# Patient Record
Sex: Male | Born: 1955 | Race: White | Hispanic: No | Smoking: Current every day smoker
Health system: Southern US, Community
[De-identification: ages and names within clinical notes are randomized; demographics above are authoritative.]

## PROBLEM LIST (undated history)

## (undated) DIAGNOSIS — I10 Essential (primary) hypertension: Secondary | ICD-10-CM

## (undated) DIAGNOSIS — I639 Cerebral infarction, unspecified: Secondary | ICD-10-CM

## (undated) DIAGNOSIS — E785 Hyperlipidemia, unspecified: Secondary | ICD-10-CM

## (undated) DIAGNOSIS — Z8709 Personal history of other diseases of the respiratory system: Secondary | ICD-10-CM

## (undated) DIAGNOSIS — F32A Depression, unspecified: Secondary | ICD-10-CM

## (undated) DIAGNOSIS — N183 Chronic kidney disease, stage 3 (moderate): Secondary | ICD-10-CM

## (undated) DIAGNOSIS — F329 Major depressive disorder, single episode, unspecified: Secondary | ICD-10-CM

## (undated) HISTORY — DX: Cerebral infarction, unspecified: I63.9

## (undated) HISTORY — PX: THORACENTESIS: SHX235

## (undated) HISTORY — DX: Chronic kidney disease, stage 3 (moderate): N18.3

## (undated) HISTORY — DX: Hyperlipidemia, unspecified: E78.5

---

## 2003-09-20 ENCOUNTER — Inpatient Hospital Stay (HOSPITAL_COMMUNITY): Admission: EM | Admit: 2003-09-20 | Discharge: 2003-09-23 | Payer: Self-pay | Admitting: Emergency Medicine

## 2003-09-21 ENCOUNTER — Encounter (INDEPENDENT_AMBULATORY_CARE_PROVIDER_SITE_OTHER): Payer: Self-pay | Admitting: Cardiology

## 2008-04-08 ENCOUNTER — Ambulatory Visit: Payer: Self-pay | Admitting: Emergency Medicine

## 2008-04-08 ENCOUNTER — Encounter: Payer: Self-pay | Admitting: Emergency Medicine

## 2008-04-08 ENCOUNTER — Inpatient Hospital Stay (HOSPITAL_COMMUNITY): Admission: AD | Admit: 2008-04-08 | Discharge: 2008-04-15 | Payer: Self-pay | Admitting: Pulmonary Disease

## 2008-04-08 ENCOUNTER — Ambulatory Visit: Payer: Self-pay | Admitting: Diagnostic Radiology

## 2008-04-08 ENCOUNTER — Ambulatory Visit: Payer: Self-pay | Admitting: Cardiovascular Disease

## 2008-04-09 ENCOUNTER — Encounter: Payer: Self-pay | Admitting: Emergency Medicine

## 2008-04-22 ENCOUNTER — Ambulatory Visit: Payer: Self-pay | Admitting: Pulmonary Disease

## 2008-04-22 DIAGNOSIS — J90 Pleural effusion, not elsewhere classified: Secondary | ICD-10-CM | POA: Insufficient documentation

## 2008-04-22 DIAGNOSIS — E785 Hyperlipidemia, unspecified: Secondary | ICD-10-CM | POA: Insufficient documentation

## 2008-04-22 DIAGNOSIS — I10 Essential (primary) hypertension: Secondary | ICD-10-CM

## 2008-04-22 HISTORY — DX: Hyperlipidemia, unspecified: E78.5

## 2008-04-23 ENCOUNTER — Encounter: Payer: Self-pay | Admitting: Pulmonary Disease

## 2008-04-23 ENCOUNTER — Ambulatory Visit: Payer: Self-pay | Admitting: Cardiology

## 2008-04-26 ENCOUNTER — Telehealth (INDEPENDENT_AMBULATORY_CARE_PROVIDER_SITE_OTHER): Payer: Self-pay | Admitting: *Deleted

## 2008-04-28 ENCOUNTER — Encounter (INDEPENDENT_AMBULATORY_CARE_PROVIDER_SITE_OTHER): Payer: Self-pay | Admitting: Diagnostic Radiology

## 2008-04-28 ENCOUNTER — Ambulatory Visit (HOSPITAL_COMMUNITY): Admission: RE | Admit: 2008-04-28 | Discharge: 2008-04-28 | Payer: Self-pay | Admitting: Pulmonary Disease

## 2008-04-29 ENCOUNTER — Telehealth (INDEPENDENT_AMBULATORY_CARE_PROVIDER_SITE_OTHER): Payer: Self-pay | Admitting: *Deleted

## 2008-06-03 ENCOUNTER — Encounter: Payer: Self-pay | Admitting: Pulmonary Disease

## 2008-06-04 ENCOUNTER — Ambulatory Visit: Payer: Self-pay | Admitting: Pulmonary Disease

## 2008-06-04 ENCOUNTER — Telehealth: Payer: Self-pay | Admitting: Pulmonary Disease

## 2010-05-11 LAB — POCT I-STAT 3, ART BLOOD GAS (G3+)
Acid-Base Excess: 2 mmol/L (ref 0.0–2.0)
Acid-Base Excess: 3 mmol/L — ABNORMAL HIGH (ref 0.0–2.0)
Acid-Base Excess: 7 mmol/L — ABNORMAL HIGH (ref 0.0–2.0)
Acid-Base Excess: 1 mmol/L (ref 0.0–2.0)
Acid-base deficit: 1 mmol/L (ref 0.0–2.0)
Bicarbonate: 28.1 mEq/L — ABNORMAL HIGH (ref 20.0–24.0)
Bicarbonate: 29 mEq/L — ABNORMAL HIGH (ref 20.0–24.0)
Bicarbonate: 25.4 mEq/L — ABNORMAL HIGH (ref 20.0–24.0)
Bicarbonate: 26.9 mEq/L — ABNORMAL HIGH (ref 20.0–24.0)
O2 Saturation: 93 %
O2 Saturation: 98 %
O2 Saturation: 98 %
O2 Saturation: 78 %
O2 Saturation: 81 %
Patient temperature: 98.7
Patient temperature: 98.7
Patient temperature: 38
Patient temperature: 38.4
TCO2: 31 mmol/L (ref 0–100)
TCO2: 29 mmol/L (ref 0–100)
pCO2 arterial: 36.7 mmHg (ref 35.0–45.0)
pCO2 arterial: 55.8 mmHg — ABNORMAL HIGH (ref 35.0–45.0)
pH, Arterial: 7.301 — ABNORMAL LOW (ref 7.350–7.450)
pH, Arterial: 7.494 — ABNORMAL HIGH (ref 7.350–7.450)
pO2, Arterial: 61 mmHg — ABNORMAL LOW (ref 80.0–100.0)
pO2, Arterial: 87 mmHg (ref 80.0–100.0)
pO2, Arterial: 98 mmHg (ref 80.0–100.0)
pO2, Arterial: 99 mmHg (ref 80.0–100.0)
pO2, Arterial: 46 mmHg — ABNORMAL LOW (ref 80.0–100.0)
pO2, Arterial: 58 mmHg — ABNORMAL LOW (ref 80.0–100.0)

## 2010-05-11 LAB — CULTURE, BLOOD (ROUTINE X 2)
Culture: NO GROWTH
Culture: NO GROWTH

## 2010-05-11 LAB — CBC
HCT: 37.3 % — ABNORMAL LOW (ref 39.0–52.0)
HCT: 29.2 % — ABNORMAL LOW (ref 39.0–52.0)
HCT: 32.4 % — ABNORMAL LOW (ref 39.0–52.0)
HCT: 34.6 % — ABNORMAL LOW (ref 39.0–52.0)
Hemoglobin: 10.4 g/dL — ABNORMAL LOW (ref 13.0–17.0)
Hemoglobin: 12.2 g/dL — ABNORMAL LOW (ref 13.0–17.0)
MCHC: 35.3 g/dL (ref 30.0–36.0)
MCHC: 34.8 g/dL (ref 30.0–36.0)
MCHC: 35.6 g/dL (ref 30.0–36.0)
MCV: 108 fL — ABNORMAL HIGH (ref 78.0–100.0)
MCV: 108.9 fL — ABNORMAL HIGH (ref 78.0–100.0)
MCV: 109.8 fL — ABNORMAL HIGH (ref 78.0–100.0)
MCV: 110 fL — ABNORMAL HIGH (ref 78.0–100.0)
MCV: 110.6 fL — ABNORMAL HIGH (ref 78.0–100.0)
Platelets: 156 10*3/uL (ref 150–400)
Platelets: 166 10*3/uL (ref 150–400)
Platelets: 179 10*3/uL (ref 150–400)
Platelets: 196 10*3/uL (ref 150–400)
RBC: 2.6 MIL/uL — ABNORMAL LOW (ref 4.22–5.81)
RBC: 2.95 MIL/uL — ABNORMAL LOW (ref 4.22–5.81)
RBC: 3.17 MIL/uL — ABNORMAL LOW (ref 4.22–5.81)
RDW: 14.7 % (ref 11.5–15.5)
RDW: 14.8 % (ref 11.5–15.5)
RDW: 14.8 % (ref 11.5–15.5)
RDW: 13.8 % (ref 11.5–15.5)
WBC: 10 10*3/uL (ref 4.0–10.5)
WBC: 12.6 10*3/uL — ABNORMAL HIGH (ref 4.0–10.5)
WBC: 8.9 10*3/uL (ref 4.0–10.5)

## 2010-05-11 LAB — BASIC METABOLIC PANEL
BUN: 46 mg/dL — ABNORMAL HIGH (ref 6–23)
BUN: 17 mg/dL (ref 6–23)
BUN: 19 mg/dL (ref 6–23)
BUN: 26 mg/dL — ABNORMAL HIGH (ref 6–23)
CO2: 26 mEq/L (ref 19–32)
CO2: 26 mEq/L (ref 19–32)
CO2: 26 mEq/L (ref 19–32)
CO2: 31 mEq/L (ref 19–32)
Calcium: 9.1 mg/dL (ref 8.4–10.5)
Calcium: 9.2 mg/dL (ref 8.4–10.5)
Calcium: 8.6 mg/dL (ref 8.4–10.5)
Chloride: 103 mEq/L (ref 96–112)
Chloride: 94 mEq/L — ABNORMAL LOW (ref 96–112)
Chloride: 94 mEq/L — ABNORMAL LOW (ref 96–112)
Chloride: 96 mEq/L (ref 96–112)
Creatinine, Ser: 1.22 mg/dL (ref 0.4–1.5)
Creatinine, Ser: 1.34 mg/dL (ref 0.4–1.5)
Creatinine, Ser: 1.61 mg/dL — ABNORMAL HIGH (ref 0.4–1.5)
GFR calc Af Amer: 60 mL/min — ABNORMAL LOW (ref >60)
GFR calc Af Amer: >60 mL/min (ref >60)
GFR calc Af Amer: 59 mL/min — ABNORMAL LOW (ref >60)
GFR calc Af Amer: >60 mL/min (ref >60)
GFR calc Af Amer: >60 mL/min (ref >60)
GFR calc non Af Amer: 50 mL/min — ABNORMAL LOW (ref >60)
GFR calc non Af Amer: 52 mL/min — ABNORMAL LOW (ref >60)
GFR calc non Af Amer: >60 mL/min (ref >60)
Glucose, Bld: 145 mg/dL — ABNORMAL HIGH (ref 70–99)
Glucose, Bld: 177 mg/dL — ABNORMAL HIGH (ref 70–99)
Potassium: 4.2 mEq/L (ref 3.5–5.1)
Potassium: 4.9 mEq/L (ref 3.5–5.1)
Potassium: 4.4 mEq/L (ref 3.5–5.1)
Potassium: 4.4 mEq/L (ref 3.5–5.1)
Potassium: 4.8 mEq/L (ref 3.5–5.1)
Sodium: 136 mEq/L (ref 135–145)
Sodium: 133 mEq/L — ABNORMAL LOW (ref 135–145)
Sodium: 137 mEq/L (ref 135–145)

## 2010-05-11 LAB — GLUCOSE, CAPILLARY
Glucose-Capillary: 125 mg/dL — ABNORMAL HIGH (ref 70–99)
Glucose-Capillary: 126 mg/dL — ABNORMAL HIGH (ref 70–99)
Glucose-Capillary: 129 mg/dL — ABNORMAL HIGH (ref 70–99)
Glucose-Capillary: 131 mg/dL — ABNORMAL HIGH (ref 70–99)
Glucose-Capillary: 131 mg/dL — ABNORMAL HIGH (ref 70–99)
Glucose-Capillary: 131 mg/dL — ABNORMAL HIGH (ref 70–99)
Glucose-Capillary: 134 mg/dL — ABNORMAL HIGH (ref 70–99)
Glucose-Capillary: 140 mg/dL — ABNORMAL HIGH (ref 70–99)
Glucose-Capillary: 140 mg/dL — ABNORMAL HIGH (ref 70–99)
Glucose-Capillary: 141 mg/dL — ABNORMAL HIGH (ref 70–99)
Glucose-Capillary: 142 mg/dL — ABNORMAL HIGH (ref 70–99)
Glucose-Capillary: 175 mg/dL — ABNORMAL HIGH (ref 70–99)

## 2010-05-11 LAB — CROSSMATCH

## 2010-05-11 LAB — COMPREHENSIVE METABOLIC PANEL
ALT: 41 U/L (ref 0–53)
ALT: 14 U/L (ref 0–53)
AST: 45 U/L — ABNORMAL HIGH (ref 0–37)
Albumin: 3.8 g/dL (ref 3.5–5.2)
Alkaline Phosphatase: 73 U/L (ref 39–117)
Alkaline Phosphatase: 90 U/L (ref 39–117)
CO2: 31 mEq/L (ref 19–32)
Chloride: 98 mEq/L (ref 96–112)
Creatinine, Ser: 1.4 mg/dL (ref 0.4–1.5)
GFR calc Af Amer: 55 mL/min — ABNORMAL LOW (ref >60)
Potassium: 4.6 mEq/L (ref 3.5–5.1)
Potassium: 4.8 mEq/L (ref 3.5–5.1)
Sodium: 138 mEq/L (ref 135–145)
Sodium: 134 mEq/L — ABNORMAL LOW (ref 135–145)
Total Bilirubin: 0.6 mg/dL (ref 0.3–1.2)
Total Protein: 7.2 g/dL (ref 6.0–8.3)

## 2010-05-11 LAB — URINALYSIS, ROUTINE W REFLEX MICROSCOPIC
Glucose, UA: NEGATIVE mg/dL
Leukocytes, UA: NEGATIVE
Nitrite: NEGATIVE
Specific Gravity, Urine: 1.03 (ref 1.005–1.030)
Specific Gravity, Urine: 1.022 (ref 1.005–1.030)
Urobilinogen, UA: 1 mg/dL (ref 0.0–1.0)
Urobilinogen, UA: 1 mg/dL (ref 0.0–1.0)

## 2010-05-11 LAB — BODY FLUID CULTURE

## 2010-05-11 LAB — DIFFERENTIAL
Basophils Relative: 1 % (ref 0–1)
Eosinophils Relative: 0 % (ref 0–5)
Lymphs Abs: 1.5 10*3/uL (ref 0.7–4.0)
Monocytes Absolute: 1.5 10*3/uL — ABNORMAL HIGH (ref 0.1–1.0)
Monocytes Relative: 7 % (ref 3–12)

## 2010-05-11 LAB — URINE MICROSCOPIC-ADD ON

## 2010-05-11 LAB — CARDIAC PANEL(CRET KIN+CKTOT+MB+TROPI)
Relative Index: 1.3 (ref 0.0–2.5)
Total CK: 102 U/L (ref 7–232)
Total CK: 127 U/L (ref 7–232)
Troponin I: 0.01 ng/mL (ref 0.00–0.06)
Troponin I: 0.02 ng/mL (ref 0.00–0.06)

## 2010-05-11 LAB — GLUCOSE, SEROUS FLUID: Glucose, Fluid: 90 mg/dL

## 2010-05-11 LAB — ETHANOL: Alcohol, Ethyl (B): <10 mg/dL (ref 0–10)

## 2010-05-11 LAB — LEGIONELLA ANTIGEN, URINE: Legionella Antigen, Urine: NEGATIVE

## 2010-05-11 LAB — MAGNESIUM
Magnesium: 2.7 mg/dL — ABNORMAL HIGH (ref 1.5–2.5)
Magnesium: 2.5 mg/dL (ref 1.5–2.5)
Magnesium: 2.6 mg/dL — ABNORMAL HIGH (ref 1.5–2.5)
Magnesium: 2.7 mg/dL — ABNORMAL HIGH (ref 1.5–2.5)

## 2010-05-11 LAB — CULTURE, BAL-QUANTITATIVE W GRAM STAIN

## 2010-05-11 LAB — ACETAMINOPHEN LEVEL: Acetaminophen (Tylenol), Serum: <10 ug/mL — ABNORMAL LOW (ref 10–30)

## 2010-05-11 LAB — PATHOLOGIST SMEAR REVIEW

## 2010-05-11 LAB — BODY FLUID CELL COUNT WITH DIFFERENTIAL
Monocyte-Macrophage-Serous Fluid: 50 % (ref 50–90)
Total Nucleated Cell Count, Fluid: 1340 cu mm — ABNORMAL HIGH (ref 0–1000)

## 2010-05-11 LAB — BRAIN NATRIURETIC PEPTIDE: Pro B Natriuretic peptide (BNP): 155 pg/mL — ABNORMAL HIGH (ref 0.0–100.0)

## 2010-05-11 LAB — PROTIME-INR: Prothrombin Time: 16.2 seconds — ABNORMAL HIGH (ref 11.6–15.2)

## 2010-05-11 LAB — APTT: aPTT: 37 seconds (ref 24–37)

## 2010-05-11 LAB — PHOSPHORUS: Phosphorus: 2.7 mg/dL (ref 2.3–4.6)

## 2010-05-11 LAB — LACTATE DEHYDROGENASE, PLEURAL OR PERITONEAL FLUID: LD, Fluid: 240 U/L — ABNORMAL HIGH (ref 3–23)

## 2010-06-13 NOTE — Discharge Summary (Signed)
[name], [name] NO.:  [id number]   MEDICAL RECORD NO.:  [id number]          PATIENT TYPE:  INP   LOCATION:  3033                         FACILITY:  [facility]   PHYSICIAN:  Nelda Bucks, MD DATE OF BIRTH:  [birth year]   DATE OF ADMISSION:  04/08/2008  DATE OF DISCHARGE:  04/15/2008                               DISCHARGE SUMMARY   DISCHARGE DIAGNOSES:  1. Vent dependent respiratory failure secondary to pulmonary      infiltrates.  2. Hypertension.  3. Community acquired pneumonia.  4. Drug withdrawal syndrome.  5. Acute renal failure.  6. Hyperlipidemia.   HISTORY OF PRESENT ILLNESS:  [name] is a 55 year old white male with  known hypertension, tobacco abuse and drug, he had a prior admit in 2005  for hypertensive heart failure with an EF of 35%.  He was diagnosed on  February 17 with community acquired pneumonia on outpatient amoxicillin  and Omnicef with some clinical improvement.  On March 11 he collapsed at  work and was brought to the [facility] emergency department and was  found to be hypoxemic and confused.  Chest x-ray showed right greater  than left infiltrates and he required intubation and was transferred to  [facility] to the care of both pulmonary and critical care service.  Blood cultures showed no growth.  BAL no growth.  H1N1 was negative.  Urine legionella was negative.  Urine enterococcal was negative.  Antibiotics of vancomycin March 11 x1, Zosyn March 11 x1, Zithromax  March 11 to March 16, ceftriaxone for March 11 until March 18, Tamiflu  from March 11 to March 16.  Lines and tubes, endotracheal tube from  March 11 to March 15, right IJ March 11 to March 15, right renal  arterial line from March 11 to March 13.  He was on a sedation protocol  and acute respiratory distress syndrome protocol.  He had a TEE on March  12 that showed ejection fraction of 55%, mild calcification aortic  valve, no change from 2005.   LAB DATA:   Hemoglobin 13.2, hematocrit 37.3, WBC 14.7, platelets 309,  sodium 137, potassium 4.2, chloride 103, CO2 26, BUN 30, creatinine  1.55, glucose 105, creatinine reached a peak of 1.61.  AST 45, ALT 49.  __________.  Albumin 3.1.  Troponin I is less than 0.01.  CK-MB was 2.3,  CK of 222.  Magnesium 2.7, phosphorus 3.9.   RADIOGRAPHIC DATA:  Chest x-ray March 13 demonstrated some decrease in  left pleural effusion, left lower lobe atelectasis, consolidation  persists.   HOSPITAL COURSE:  1. Acute respiratory failure.  Vent dependent respiratory failure with      pulmonary infiltrates.  He was admitted to [facility] intensive      care unit and has been orally intubated and required mechanical      ventilatory support.  He was successfully liberated from mechanical      ventilatory support by April 12, 2008.  He no longer requires      oxygen and has reached maximal hospital benefit and will  be      discharged in improved condition.  2. Pneumonia community acquire with cultures remaining negative.  He      was treated with antimicrobial therapy and with antivirals.  He has      completed 7 days of antimicrobial therapy and no further      antibiotics were required.  3. Altered mental status suspect drug withdrawal syndrome from      alcohol.  He was maintained on thiamine, folic acid and      multivitamins and no withdrawal symptoms were noted.  4. Acute renal failure.  He had a slight bump in his creatinine which      has now returned.  No further Lasix has been given.  5. Hyperglycemia.  All insulin was to be discontinued as capillary      blood glucose remained normal.  No further interventions are      required.   DISCHARGE MEDICATIONS:  1. He will be on thiamine 100 mg daily.  2. Folic acid 1 mg daily.  3. Multivitamin one daily.   FOLLOWUP:  He has got a followup appointment with Dr. Janace Hoard at  urgent care at [facility].  He is to walk in within 1 week.  Phone number  there  is [phone].  He is instructed if he has any problems he can  follow up with [place] at the pulmonary critical care division at [facility].   DIET:  Is low-sodium, heart healthy diet.   SPECIAL INSTRUCTIONS:  No smoking , no drinking.  He is being discharged  in improved condition.      Devra Dopp, MSN, ACNP      Nelda Bucks, MD  Electronically Signed    SM/MEDQ  D:  04/15/2008  T:  04/15/2008  Job:  [id number]   cc:   Peyton Najjar, MD

## 2010-06-16 NOTE — Discharge Summary (Signed)
NAMEOSHER, OETTINGER                         ACCOUNT NO.:  [id number]   MEDICAL RECORD NO.:  [id number]                   PATIENT TYPE:  INP   LOCATION:  6529                                 FACILITY:  [facility]   PHYSICIAN:  Jackie Plum, M.D.             DATE OF BIRTH:  03-12-1955   DATE OF ADMISSION:  09/20/2003  DATE OF DISCHARGE:  09/23/2003                                 DISCHARGE SUMMARY   DISCHARGE DIAGNOSES:  1. Hypertensive crisis - resolved.  2. Hypertension.  3. Cigarette smoking.   DISCHARGE MEDICATIONS:  1. Aspirin 81 mg p.o. daily.  2. Lotensin 20 mg p.o. daily.   PROCEDURE:  Stress Cardiolite done today, September 23, 2003.  Patient is  awaiting full report prior to discharge today.  If it turns out to be  negative, activity as tolerated.  Diet to be low salt diet.   SPECIAL INSTRUCTIONS:  Asked to stop smoking cigarettes.  He is to report to  the doctor if he has persistent problems including persistent headaches,  chest pain and breathing difficulties.   FOLLOW UP:  With Dr. Theresia Lo.  He is to call for appointment in 7-10 days.   REASON FOR ADMISSION:  Accelerated hypertension.  Patient presented with  dyspnea, mild nonproductive cough without fever or chills, paroxysmal  nocturnal dyspnea or orthopnea.  He had gone to the walk-in clinic for [facility]  at which point BP was said to be 240/120 and, therefore, sent to the ED.  At  the ED, the diastolic BP was said to be around 140 mmHg.  X-ray showed  cardiomegaly with vascular congestion which is said to be mild.  Lab work  was notable for elevated BNP (BNP was __________ on admission.  A 12-lead  EKG was notable for sinus rhythm with left ventricular hypertrophy.  There  is no acute ST-T wave changes.  He was, therefore, admitted to hospitalist  service for further management of his elevated hypertension.   HOSPITAL COURSE:  The patient was admitted to a stepdown  bed.  Initially he  was started on IV  labetalol drip with good control of his blood pressures  and subsequently switched to an ACE inhibitor, i.e. Lotensin, which was  gradually increased to maximize better BP control.  A 2D echo was obtained  in view of his x-ray findings of cardiomegaly with possible mild  interstitial markings.  However, could not rule out frank CHF.  The  echocardiogram revealed EF of 50-55% without any left ventricular regional  Minjares motion abnormalities.  Patient had serial cardiac enzymes which turned  out to be marginally elevated with a peak troponin of 0.10 and came down to  0.06 with normal total CPK and MB fraction.  It was, therefore, deemed  expedient to at least obtain a stress Cardiolite which was done today, and  this is pending at time of this dictation.  At this point it is  unclear  whether patient may have a mild systolic dysfunction with hypertensive heart  disease.  He, therefore, is planned for discharge home on ACE inhibitor and  aspirin for now and he will need continued monitoring of his blood pressures  and adjustment in his __________ regimen as needed per PCP.  The plan is to  discharge him today, should the stress Cardiolite show no significant  ischemic changes.  As part of his evaluation, patient also had a lipid panel  done which noted total cholesterol of 191 with HDL of 83 and LDL of 91.  On  runs today patient is well, does not have any fever or chills, no chest  pain, shortness of breath, palpitations or dyspnea on exertion.  BP was  138/98 with a heart  rate of 72.  His lungs are clear to auscultation.  His heart without any  JVD, no peripheral edema.  He had regular rhythm without any gallops or  murmur on cardiac auscultation, and abdomen is soft with normal active bowel  sounds.  Patient is now for discharge after review of his Cardiolite today.                                                Jackie Plum, M.D.    GO/MEDQ  D:  09/23/2003  T:  09/23/2003  Job:   [id number]   cc:   Vikki Ports, M.D.  [address]  [place]  [place] [address]  Fax: [phone]

## 2010-06-16 NOTE — H&P (Signed)
NAME:  Brandon [name], Brandon [name]                         ACCOUNT NO.:  [id number]   MEDICAL RECORD NO.:  [id number]                   PATIENT TYPE:  INP   LOCATION:  1826                                 FACILITY:  [facility]   PHYSICIAN:  Sherin Quarry, MD                   DATE OF BIRTH:  16-Sep-1955   DATE OF ADMISSION:  09/20/2003  DATE OF DISCHARGE:                                HISTORY & PHYSICAL   HISTORY OF PRESENT ILLNESS:  Brandon [name] is a 55 year old man who recalls that  about two years ago he was seen by dental extractions and was advised by the  oral surgeon that his blood pressure was very high and that he needed  medical attention.  Unfortunately, he did not seek any follow up with the  doctor.  The only doctor he can recall seeing in the last several years was  Dr. Theresia Lo who saw him about two years ago about a shoulder problem.  On  Friday prior to admission, the patient noted increase in shortness of breath  which he attributed to job related stress.  He had a mild cough.  He has not  noted any definite orthopnea, PND, ankle edema, or exertional chest pain.  He does note more dyspnea than usual when he mows the yard.  He smokes about  1/2 to 1 pack cigarettes per day.  When the symptoms of dyspnea failed to  resolve, he presented to the [facility] where, reportedly, his  blood pressure was 240/120.  He was referred by EMS to the emergency room.  In the emergency room, a chest x-ray was obtained which showed definite  cardiomegaly and possible increased interstitial markings consistent with  congestive heart failure.  In light of these findings and symptoms, he is  admitted for further evaluation and treatment of hypertensive urgency.   PAST MEDICAL HISTORY:  He has no known drug allergies.  Medications are none.  Operations:  The patient has had a previous dental extraction, he cannot  recall any other operations.   FAMILY HISTORY:  His mother died of cancer at the age  of 75, possibly breast  cancer.  His father is said to be alive and well.  All his siblings are in  good health.   SOCIAL HISTORY:  He smokes 1/2 to 1 pack cigarettes per day.  His alcohol  intake is somewhat uncertain, he agrees that he drinks about 2-4 alcoholic  beverages per day, generally wine or tequila.  His wife nods her head when  he says this but will not elaborate.  He denies any history of delirium  tremens or alcoholic hallucinosis.  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   REVIEW OF SYMPTOMS:  HEAD:  He has chronic dull occipital headache.  ENT:  He denies earache, sinus pain, or sore throat.  CHEST:  See above.  CARDIOVASCULAR:  See above.  GI:  He has occasional indigestion.  He denies  nausea, vomiting, abdominal pain, or change in bowel habits.  GU:  He denies  dysuria or urinary frequency.  NEUROLOGICAL:  No  history of seizure or  stroke.  ENDOCRINE:  He denies a history of diabetes or thyroid disease.  There is no history of excessive thirst or urinary frequency.   PHYSICAL EXAMINATION:  VITAL SIGNS:  Blood pressure currently 203/140, this is after receiving a  sublingual nitroglycerin, 20 mg of IV Labetalol, and Clonidine 0.2 mg p.o.  Pulse 94, respirations 18.  HEENT:  Within normal limits.  CHEST:  Clear to auscultation and percussion.  CARDIOVASCULAR:  Normal S1 and S2, no rubs, murmurs, or gallops.  ABDOMEN:  Benign, normal bowel sounds without masses or tenderness.  NEUROLOGICAL:  Examination of the extremities is normal.   LABORATORY DATA:  Troponin is less than 0.05, creatinine 1.3, hemoglobin  15.6, potassium 4.1, glucose 92.  EKG shows findings consistent with left  ventricular hypertrophy with strain pattern, possible ischemic changes are  identified.   IMPRESSION:  1. Uncontrolled hypertension.  2. Probable congestive heart failure with cardiomegaly secondary to #1.  3. Rule out myocardial infarction.  4. 30  pack year smoking history.  5. Probable excess alcohol intake.  It is hard to say whether this is     contributing to his cardiac problems.   PLAN:  The patient will be admitted to establish better blood pressure  control, we will initiate intravenous Labetalol as well as an ACE inhibitor.  Myocardial infarction will be ruled out.  A 2D echocardiogram will be  obtained.  The patient probably needs a stress test done when he is more  stable.  Will follow the BNP.  He was counseled on the importance of  discontinuing cigarette smoking.  As mentioned previously, his alcohol  consumption could be contributing to his hypertension problems.                                                Sherin Quarry, MD    SY/MEDQ  D:  09/20/2003  T:  09/20/2003  Job:  [id number]   cc:   Vikki Ports, M.D.  [address]  [place]  [place] [address]  Fax: [phone]

## 2011-03-07 ENCOUNTER — Telehealth: Payer: Self-pay | Admitting: Physician Assistant

## 2011-03-07 MED ORDER — ALPRAZOLAM 0.5 MG PO TABS: 0.5000 mg | ORAL_TABLET | Freq: Every evening | ORAL | Status: AC | PRN

## 2011-03-07 NOTE — Telephone Encounter (Signed)
Ok to fill x 1. Needs ov for further refills. ryan

## 2011-03-20 ENCOUNTER — Other Ambulatory Visit: Payer: Self-pay

## 2011-05-20 ENCOUNTER — Telehealth: Payer: Self-pay

## 2011-05-20 DIAGNOSIS — N289 Disorder of kidney and ureter, unspecified: Secondary | ICD-10-CM | POA: Insufficient documentation

## 2011-05-20 DIAGNOSIS — E669 Obesity, unspecified: Secondary | ICD-10-CM | POA: Insufficient documentation

## 2011-05-20 DIAGNOSIS — F329 Major depressive disorder, single episode, unspecified: Secondary | ICD-10-CM

## 2011-05-20 DIAGNOSIS — F1010 Alcohol abuse, uncomplicated: Secondary | ICD-10-CM | POA: Insufficient documentation

## 2011-05-20 MED ORDER — ALPRAZOLAM 0.5 MG PO TABS: 0.5000 mg | ORAL_TABLET | Freq: Every evening | ORAL | Status: DC | PRN

## 2011-05-20 NOTE — Telephone Encounter (Signed)
PT WOULD LIKE TO KNOW IF WE COULD REFILL HIS ALPRAZOLAM EVEN THOUGH HE MAY NOT HAVE ANY REFILLS LEFT, PT STATES THAT HE WAS RECENTLY LAID OFF AND DOES NOT HAVE THE MONEY TO COME IN AT THIS TIME.

## 2011-05-20 NOTE — Telephone Encounter (Signed)
When does he think he'll be able to come in for evaluation?

## 2011-05-20 NOTE — Telephone Encounter (Signed)
Called pt and he states he only gets 300 week in unemployment. He is going to try to come in the next month and a half at the quote of an office visit being 103 dollars.

## 2011-05-20 NOTE — Telephone Encounter (Signed)
Rx printed

## 2011-05-20 NOTE — Telephone Encounter (Signed)
Spoke with pt advised to recheck within the next month. Rx faxed

## 2011-11-26 ENCOUNTER — Other Ambulatory Visit: Payer: Self-pay | Admitting: Family Medicine

## 2012-01-07 ENCOUNTER — Other Ambulatory Visit: Payer: Self-pay | Admitting: Physician Assistant

## 2012-01-10 ENCOUNTER — Other Ambulatory Visit: Payer: Self-pay | Admitting: Physician Assistant

## 2012-01-11 NOTE — Telephone Encounter (Signed)
Please pull paper chart.  

## 2012-01-11 NOTE — Telephone Encounter (Signed)
[id number] pulled and forwarded to pa pool  bf

## 2012-01-11 NOTE — Telephone Encounter (Signed)
Needs office visit.

## 2012-01-16 ENCOUNTER — Ambulatory Visit: Payer: Self-pay | Admitting: Internal Medicine

## 2016-10-17 ENCOUNTER — Inpatient Hospital Stay (HOSPITAL_COMMUNITY): Payer: Medicaid Other

## 2016-10-17 ENCOUNTER — Inpatient Hospital Stay (HOSPITAL_COMMUNITY)
Admission: EM | Admit: 2016-10-17 | Discharge: 2016-10-26 | DRG: 981 | Disposition: A | Discharge status: Skilled Nursing Facility | Payer: Medicaid Other | Attending: Internal Medicine | Admitting: Internal Medicine

## 2016-10-17 ENCOUNTER — Encounter (HOSPITAL_COMMUNITY): Payer: Self-pay

## 2016-10-17 ENCOUNTER — Inpatient Hospital Stay (HOSPITAL_COMMUNITY): Admit: 2016-10-17 | Payer: Self-pay

## 2016-10-17 ENCOUNTER — Emergency Department (HOSPITAL_COMMUNITY): Payer: Medicaid Other

## 2016-10-17 DIAGNOSIS — I2510 Atherosclerotic heart disease of native coronary artery without angina pectoris: Secondary | ICD-10-CM | POA: Diagnosis present

## 2016-10-17 DIAGNOSIS — I6340 Cerebral infarction due to embolism of unspecified cerebral artery: Principal | ICD-10-CM | POA: Diagnosis present

## 2016-10-17 DIAGNOSIS — E876 Hypokalemia: Secondary | ICD-10-CM | POA: Diagnosis present

## 2016-10-17 DIAGNOSIS — R29810 Facial weakness: Secondary | ICD-10-CM | POA: Diagnosis present

## 2016-10-17 DIAGNOSIS — F32A Depression, unspecified: Secondary | ICD-10-CM | POA: Diagnosis present

## 2016-10-17 DIAGNOSIS — M109 Gout, unspecified: Secondary | ICD-10-CM | POA: Diagnosis present

## 2016-10-17 DIAGNOSIS — E860 Dehydration: Secondary | ICD-10-CM | POA: Diagnosis present

## 2016-10-17 DIAGNOSIS — R29712 NIHSS score 12: Secondary | ICD-10-CM | POA: Diagnosis present

## 2016-10-17 DIAGNOSIS — Q211 Atrial septal defect: Secondary | ICD-10-CM

## 2016-10-17 DIAGNOSIS — Q2112 Patent foramen ovale: Secondary | ICD-10-CM

## 2016-10-17 DIAGNOSIS — R471 Dysarthria and anarthria: Secondary | ICD-10-CM | POA: Diagnosis present

## 2016-10-17 DIAGNOSIS — E1122 Type 2 diabetes mellitus with diabetic chronic kidney disease: Secondary | ICD-10-CM | POA: Diagnosis present

## 2016-10-17 DIAGNOSIS — G8101 Flaccid hemiplegia affecting right dominant side: Secondary | ICD-10-CM

## 2016-10-17 DIAGNOSIS — N289 Disorder of kidney and ureter, unspecified: Secondary | ICD-10-CM

## 2016-10-17 DIAGNOSIS — F1010 Alcohol abuse, uncomplicated: Secondary | ICD-10-CM

## 2016-10-17 DIAGNOSIS — I21A1 Myocardial infarction type 2: Secondary | ICD-10-CM | POA: Diagnosis not present

## 2016-10-17 DIAGNOSIS — I4891 Unspecified atrial fibrillation: Secondary | ICD-10-CM | POA: Diagnosis present

## 2016-10-17 DIAGNOSIS — E7800 Pure hypercholesterolemia, unspecified: Secondary | ICD-10-CM

## 2016-10-17 DIAGNOSIS — R402242 Coma scale, best verbal response, confused conversation, at arrival to emergency department: Secondary | ICD-10-CM | POA: Diagnosis present

## 2016-10-17 DIAGNOSIS — I639 Cerebral infarction, unspecified: Secondary | ICD-10-CM

## 2016-10-17 DIAGNOSIS — I6300 Cerebral infarction due to thrombosis of unspecified precerebral artery: Secondary | ICD-10-CM

## 2016-10-17 DIAGNOSIS — E785 Hyperlipidemia, unspecified: Secondary | ICD-10-CM | POA: Diagnosis not present

## 2016-10-17 DIAGNOSIS — N183 Chronic kidney disease, stage 3 (moderate): Secondary | ICD-10-CM | POA: Diagnosis not present

## 2016-10-17 DIAGNOSIS — R402142 Coma scale, eyes open, spontaneous, at arrival to emergency department: Secondary | ICD-10-CM | POA: Diagnosis present

## 2016-10-17 DIAGNOSIS — Z6835 Body mass index (BMI) 35.0-35.9, adult: Secondary | ICD-10-CM | POA: Diagnosis not present

## 2016-10-17 DIAGNOSIS — G8111 Spastic hemiplegia affecting right dominant side: Secondary | ICD-10-CM | POA: Diagnosis present

## 2016-10-17 DIAGNOSIS — F329 Major depressive disorder, single episode, unspecified: Secondary | ICD-10-CM | POA: Diagnosis not present

## 2016-10-17 DIAGNOSIS — I1310 Hypertensive heart and chronic kidney disease without heart failure, with stage 1 through stage 4 chronic kidney disease, or unspecified chronic kidney disease: Secondary | ICD-10-CM | POA: Diagnosis present

## 2016-10-17 DIAGNOSIS — R52 Pain, unspecified: Secondary | ICD-10-CM

## 2016-10-17 DIAGNOSIS — E1151 Type 2 diabetes mellitus with diabetic peripheral angiopathy without gangrene: Secondary | ICD-10-CM | POA: Diagnosis present

## 2016-10-17 DIAGNOSIS — R402362 Coma scale, best motor response, obeys commands, at arrival to emergency department: Secondary | ICD-10-CM | POA: Diagnosis present

## 2016-10-17 HISTORY — DX: Personal history of other diseases of the respiratory system: Z87.09

## 2016-10-17 HISTORY — DX: Depression, unspecified: F32.A

## 2016-10-17 HISTORY — DX: Major depressive disorder, single episode, unspecified: F32.9

## 2016-10-17 HISTORY — DX: Essential (primary) hypertension: I10

## 2016-10-17 HISTORY — DX: Hyperlipidemia, unspecified: E78.5

## 2016-10-17 HISTORY — DX: Cerebral infarction, unspecified: I63.9

## 2016-10-17 LAB — PROTIME-INR
INR: 1.25
PROTHROMBIN TIME: 15.6 s — AB (ref 11.4–15.2)

## 2016-10-17 LAB — CBC
HCT: 48.4 % (ref 39.0–52.0)
Hemoglobin: 17.8 g/dL — ABNORMAL HIGH (ref 13.0–17.0)
MCH: 34.6 pg — ABNORMAL HIGH (ref 26.0–34.0)
MCHC: 36.8 g/dL — ABNORMAL HIGH (ref 30.0–36.0)
MCV: 94.2 fL (ref 78.0–100.0)
PLATELETS: 257 10*3/uL (ref 150–400)
RBC: 5.14 MIL/uL (ref 4.22–5.81)
RDW: 12.5 % (ref 11.5–15.5)
WBC: 12.3 10*3/uL — AB (ref 4.0–10.5)

## 2016-10-17 LAB — COMPREHENSIVE METABOLIC PANEL
ALBUMIN: 3.7 g/dL (ref 3.5–5.0)
ALK PHOS: 72 U/L (ref 38–126)
ALT: 48 U/L (ref 17–63)
ANION GAP: 10 (ref 5–15)
AST: 59 U/L — ABNORMAL HIGH (ref 15–41)
BUN: 48 mg/dL — ABNORMAL HIGH (ref 6–20)
CHLORIDE: 105 mmol/L (ref 101–111)
CO2: 27 mmol/L (ref 22–32)
CREATININE: 1.8 mg/dL — AB (ref 0.61–1.24)
Calcium: 9.2 mg/dL (ref 8.9–10.3)
GFR calc non Af Amer: 39 mL/min — ABNORMAL LOW (ref >60)
GFR, EST AFRICAN AMERICAN: 45 mL/min — AB (ref >60)
GLUCOSE: 101 mg/dL — AB (ref 65–99)
Potassium: 3.4 mmol/L — ABNORMAL LOW (ref 3.5–5.1)
SODIUM: 142 mmol/L (ref 135–145)
Total Bilirubin: 1.4 mg/dL — ABNORMAL HIGH (ref 0.3–1.2)
Total Protein: 8 g/dL (ref 6.5–8.1)

## 2016-10-17 LAB — URINALYSIS, ROUTINE W REFLEX MICROSCOPIC
Bacteria, UA: NONE SEEN
Bilirubin Urine: NEGATIVE
Glucose, UA: NEGATIVE mg/dL
Hgb urine dipstick: NEGATIVE
KETONES UR: 5 mg/dL — AB
Leukocytes, UA: NEGATIVE
Nitrite: NEGATIVE
PROTEIN: 100 mg/dL — AB
Specific Gravity, Urine: 1.023 (ref 1.005–1.030)
pH: 5 (ref 5.0–8.0)

## 2016-10-17 LAB — I-STAT TROPONIN, ED: Troponin i, poc: 0.4 ng/mL (ref 0.00–0.08)

## 2016-10-17 LAB — DIFFERENTIAL
BASOS PCT: 0 %
Basophils Absolute: 0.1 10*3/uL (ref 0.0–0.1)
Eosinophils Absolute: 0.8 10*3/uL — ABNORMAL HIGH (ref 0.0–0.7)
Eosinophils Relative: 6 %
Lymphocytes Relative: 20 %
Lymphs Abs: 2.4 10*3/uL (ref 0.7–4.0)
MONO ABS: 1 10*3/uL (ref 0.1–1.0)
Monocytes Relative: 8 %
NEUTROS ABS: 8.1 10*3/uL — AB (ref 1.7–7.7)
NEUTROS PCT: 66 %

## 2016-10-17 LAB — CBG MONITORING, ED: Glucose-Capillary: 107 mg/dL — ABNORMAL HIGH (ref 65–99)

## 2016-10-17 LAB — RAPID URINE DRUG SCREEN, HOSP PERFORMED
AMPHETAMINES: NOT DETECTED
Barbiturates: NOT DETECTED
Benzodiazepines: NOT DETECTED
COCAINE: NOT DETECTED
Opiates: NOT DETECTED
Tetrahydrocannabinol: NOT DETECTED

## 2016-10-17 LAB — TROPONIN I: Troponin I: 0.21 ng/mL (ref <0.03)

## 2016-10-17 LAB — APTT: APTT: 26 s (ref 24–36)

## 2016-10-17 LAB — ETHANOL

## 2016-10-17 MED ORDER — ONDANSETRON HCL 4 MG/2ML IJ SOLN
4.0000 mg | Freq: Four times a day (QID) | INTRAMUSCULAR | Status: DC | PRN
  Administered 2016-10-23: 4 mg via INTRAVENOUS

## 2016-10-17 MED ORDER — STROKE: EARLY STAGES OF RECOVERY BOOK
Freq: Once | Status: DC
  Filled 2016-10-17: qty 1

## 2016-10-17 MED ORDER — LABETALOL HCL 5 MG/ML IV SOLN
10.0000 mg | Freq: Once | INTRAVENOUS | Status: AC
  Administered 2016-10-17: 10 mg via INTRAVENOUS
  Filled 2016-10-17: qty 4

## 2016-10-17 MED ORDER — NICARDIPINE HCL IN NACL 20-0.86 MG/200ML-% IV SOLN
3.0000 mg/h | Freq: Once | INTRAVENOUS | Status: AC
  Administered 2016-10-17: 5 mg/h via INTRAVENOUS
  Filled 2016-10-17: qty 200

## 2016-10-17 MED ORDER — HYDRALAZINE HCL 20 MG/ML IJ SOLN
10.0000 mg | Freq: Once | INTRAMUSCULAR | Status: AC
  Administered 2016-10-17: 10 mg via INTRAVENOUS
  Filled 2016-10-17: qty 1

## 2016-10-17 MED ORDER — SODIUM CHLORIDE 0.9 % IV SOLN: INTRAVENOUS | Status: DC

## 2016-10-17 MED ORDER — GADOBENATE DIMEGLUMINE 529 MG/ML IV SOLN
20.0000 mL | Freq: Once | INTRAVENOUS | Status: AC | PRN
  Administered 2016-10-17: 20 mL via INTRAVENOUS

## 2016-10-17 MED ORDER — HEPARIN SODIUM (PORCINE) 5000 UNIT/ML IJ SOLN
5000.0000 [IU] | Freq: Three times a day (TID) | INTRAMUSCULAR | Status: DC
  Administered 2016-10-17: 5000 [IU] via SUBCUTANEOUS

## 2016-10-17 MED ORDER — HEPARIN SODIUM (PORCINE) 5000 UNIT/ML IJ SOLN
5000.0000 [IU] | Freq: Three times a day (TID) | INTRAMUSCULAR | Status: DC
  Administered 2016-10-18 – 2016-10-26 (×25): 5000 [IU] via SUBCUTANEOUS
  Filled 2016-10-17 (×25): qty 1

## 2016-10-17 MED ORDER — SENNOSIDES-DOCUSATE SODIUM 8.6-50 MG PO TABS
1.0000 | ORAL_TABLET | Freq: Two times a day (BID) | ORAL | Status: DC
  Administered 2016-10-19 – 2016-10-24 (×10): 1 via ORAL
  Filled 2016-10-17 (×17): qty 1

## 2016-10-17 MED ORDER — ASPIRIN 325 MG PO TABS
325.0000 mg | ORAL_TABLET | Freq: Every day | ORAL | Status: DC
  Administered 2016-10-17 – 2016-10-21 (×5): 325 mg via ORAL
  Filled 2016-10-17 (×5): qty 1

## 2016-10-17 MED ORDER — THIAMINE HCL 100 MG/ML IJ SOLN
100.0000 mg | Freq: Once | INTRAMUSCULAR | Status: AC
  Administered 2016-10-17: 100 mg via INTRAVENOUS
  Filled 2016-10-17: qty 2

## 2016-10-17 MED ORDER — POTASSIUM CHLORIDE IN NACL 40-0.9 MEQ/L-% IV SOLN
INTRAVENOUS | Status: DC
  Administered 2016-10-17 – 2016-10-20 (×4): 75 mL/h via INTRAVENOUS
  Filled 2016-10-17 (×6): qty 1000

## 2016-10-17 MED ORDER — ACETAMINOPHEN 650 MG RE SUPP: 650.0000 mg | RECTAL | Status: DC | PRN

## 2016-10-17 MED ORDER — ACETAMINOPHEN 325 MG PO TABS
650.0000 mg | ORAL_TABLET | ORAL | Status: DC | PRN
  Administered 2016-10-23 – 2016-10-25 (×4): 650 mg via ORAL
  Filled 2016-10-17 (×4): qty 2

## 2016-10-17 MED ORDER — ALPRAZOLAM 0.5 MG PO TABS
0.5000 mg | ORAL_TABLET | Freq: Two times a day (BID) | ORAL | Status: DC | PRN
Start: 2016-10-17 — End: 2016-10-26
  Administered 2016-10-18 – 2016-10-23 (×3): 0.5 mg via ORAL
  Filled 2016-10-17 (×3): qty 1

## 2016-10-17 MED ORDER — ACETAMINOPHEN 160 MG/5ML PO SOLN: 650.0000 mg | ORAL | Status: DC | PRN

## 2016-10-17 NOTE — ED Notes (Signed)
Report given to [facility]. 

## 2016-10-17 NOTE — H&P (Signed)
[facility] History and Physical  Brandon [name] ZOX:[id number] DOB: 12-Apr-1955 DOA: 10/17/2016  Referring physician: ED  PCP: Minor, Vilinda Blanks, NP   Chief Complaint: *Right-sided weakness  HPI:  61 year old male with a history of depression, alcohol abuse, chronic kidney disease, morbid obesity, found on the floor by his father, EMS was called. Patient noted to have right-sided weakness including right upper right lower extremity weakness, according to the patient onset 48 hours ago prior to presentation. Patient also had a headache but no nausea vomiting. Denied any blurry vision, slurred speech, difficulty swallowing ED course Initially found to be hypertensive with systolic blood pressure greater than 200 Potassium 3.4, troponin 0.4, white blood cell count 12.3, hemoglobin 17.8 CT head did not show any acute hemorrhage or infarct,EDP called neurology in [facility], they recommended admission for full stroke workup      Review of Systems: negative for the following  Constitutional: Denies fever, chills, diaphoresis, appetite change and fatigue.  HEENT: Denies photophobia, eye pain, redness, hearing loss, ear pain, congestion, sore throat, rhinorrhea, sneezing, mouth sores, trouble swallowing, neck pain, neck stiffness and tinnitus.  Respiratory: Denies SOB, DOE, cough, chest tightness, and wheezing.  Cardiovascular: Denies chest pain, palpitations and leg swelling.  Gastrointestinal: Denies nausea, vomiting, abdominal pain, diarrhea, constipation, blood in stool and abdominal distention.  Genitourinary: Denies dysuria, urgency, frequency, hematuria, flank pain and difficulty urinating.  Musculoskeletal: Positive for gait problem. Negative for back pain and neck pain.  Skin: Negative for rash and wound.  Neurological: Positive for speech difficulty and weakness. Negative for dizziness, light-headedness, numbness and headaches.  Psychiatric/Behavioral: Positive for  confusion.      No past medical history on file.   No past surgical history on file.    Social History:  has no tobacco, alcohol, and drug history on file.     No known drug allergies  FAMILY HISTORY  When questioned  Directly-patient reports  No family history of HTN, CVA ,DIABETES, TB, Cancer CAD, Bleeding Disorders, Sickle Cell, diabetes, anemia, asthma,   Prior to Admission medications   Medication Sig Start Date End Date Taking? Authorizing Provider  diphenhydrAMINE (BENADRYL) 25 mg capsule Take 25 mg by mouth every 6 (six) hours as needed for allergies.   Yes [provider]  ALPRAZolam (XANAX) 0.5 MG tablet Take 1 tablet (0.5 mg total) by mouth at bedtime as needed. Patient not taking: Reported on 10/17/2016 05/20/11   Porfirio Oar, PA-C  benazepril (LOTENSIN) 40 MG tablet Take 1 tablet (40 mg total) by mouth daily. Needs office visit. Patient not taking: Reported on 10/17/2016 01/10/12   Sondra Barges, PA-C     Physical Exam: Vitals:   10/17/16 1235 10/17/16 1240 10/17/16 1245 10/17/16 1250  BP: (!) 157/92 (!) 153/95 (!) 151/93 (!) 162/90  Pulse: 78 80 78 81  Resp: SpO2: 95% 96% 96% 98%        Vitals:   10/17/16 1235 10/17/16 1240 10/17/16 1245 10/17/16 1250  BP: (!) 157/92 (!) 153/95 (!) 151/93 (!) 162/90  Pulse: 78 80 78 81  Resp: SpO2: 95% 96% 96% 98%   Constitutional: NAD, calm, comfortable Eyes: PERRL, lids and conjunctivae normal ENMT: Mucous membranes are moist. Posterior pharynx clear of any exudate or lesions.Normal dentition.  Neck: No intraoral trauma. Right lower facial droop.   Eyes: Pupils are equal, round, and reactive to light. EOM are normal.  Neck: Normal range of motion. Neck supple.  No posterior midline cervical tenderness to palpation. No meningismus.  Cardiovascular: Normal rate and regular rhythm.  Exam reveals no gallop and no friction rub.   No murmur heard. Pulmonary/Chest: Effort normal and  breath sounds normal. No respiratory distress. He has no wheezes. He has no rales. He exhibits no tenderness.  Abdominal: Soft. Bowel sounds are normal. There is no tenderness. There is no rebound and no guarding.  Musculoskeletal: Normal range of motion. He exhibits no edema or tenderness.  No midline thoracic or lumbar tenderness. No lower extremity swelling or asymmetry. Distal pulses are 2+.  Neurological: He is alert and oriented to person, place, and time.  1/5 motor in the right upper and right lower extremity. 4/5 motor in left upper and lower extremities. Sensation to light touch is fully intact. Patient has upgoing Babinski on the right. Question mild slurred speech Psychiatric: Normal judgment and insight. Alert and oriented x 3. Normal mood.     Labs on Admission: I have personally reviewed following labs and imaging studies  CBC:  Recent Labs Lab 10/17/16 0850  WBC 12.3*  NEUTROABS 8.1*  HGB 17.8*  HCT 48.4  MCV 94.2  PLT 257    Basic Metabolic Panel:  Recent Labs Lab 10/17/16 0850  NA 142  K 3.4*  CL 105  CO2 27  GLUCOSE 101*  BUN 48*  CREATININE 1.80*  CALCIUM 9.2    GFR: CrCl cannot be calculated (Unknown ideal weight.).  Liver Function Tests:  Recent Labs Lab 10/17/16 0850  AST 59*  ALT 48  ALKPHOS 72  BILITOT 1.4*  PROT 8.0  ALBUMIN 3.7   No results for input(s): LIPASE, AMYLASE in the last 168 hours. No results for input(s): AMMONIA in the last 168 hours.  Coagulation Profile:  Recent Labs Lab 10/17/16 0850  INR 1.25   No results for input(s): DDIMER in the last 72 hours.  Cardiac Enzymes: No results for input(s): CKTOTAL, CKMB, CKMBINDEX, TROPONINI in the last 168 hours.  BNP (last 3 results) No results for input(s): PROBNP in the last 8760 hours.  HbA1C: No results for input(s): HGBA1C in the last 72 hours. No results found for: HGBA1C   CBG: No results for input(s): GLUCAP in the last 168 hours.  Lipid Profile: No  results for input(s): CHOL, HDL, LDLCALC, TRIG, CHOLHDL, LDLDIRECT in the last 72 hours.  Thyroid Function Tests: No results for input(s): TSH, T4TOTAL, FREET4, T3FREE, THYROIDAB in the last 72 hours.  Anemia Panel: No results for input(s): VITAMINB12, FOLATE, FERRITIN, TIBC, IRON, RETICCTPCT in the last 72 hours.  Urine analysis:    Component Value Date/Time   COLORURINE AMBER BIOCHEMICALS MAY BE AFFECTED BY COLOR (A) 04/08/2008 2101   APPEARANCEUR CLOUDY (A) 04/08/2008 2101   LABSPEC 1.030 04/08/2008 2101   PHURINE 5.0 04/08/2008 2101   GLUCOSEU NEGATIVE 04/08/2008 2101   HGBUR LARGE (A) 04/08/2008 2101   BILIRUBINUR SMALL (A) 04/08/2008 2101   KETONESUR NEGATIVE 04/08/2008 2101   PROTEINUR 30 (A) 04/08/2008 2101   UROBILINOGEN 1.0 04/08/2008 2101   NITRITE NEGATIVE 04/08/2008 2101   LEUKOCYTESUR TRACE (A) 04/08/2008 2101    Sepsis Labs: (procalcitonin:4,lacticidven:4) )No results found for this or any previous visit (from the past 240 hour(s)).       Radiological Exams on Admission: Ct Head Wo Contrast  Result Date: 10/17/2016 CLINICAL DATA:  Right-sided weakness and facial droop with leaning. Symptoms since waking. EXAM: CT HEAD WITHOUT CONTRAST TECHNIQUE: Contiguous axial images were obtained from the base of  the skull through the vertex without intravenous contrast. COMPARISON:  None. FINDINGS: Brain: No evidence of acute cortical infarction. No hemorrhage, hydrocephalus, or masslike findings. There is advanced small-vessel type ischemic injury in the cerebral white matter and likely right pons. In general these changes are chronic, but specific areas of insults are age-indeterminate by CT. Small remote appearing high right frontal cortex infarct. Vascular: Atherosclerotic calcification. Vessels appear large in tortuous, which can be a sign of chronic hypertension. Skull: No acute or aggressive finding. Sinuses/Orbits: Negative. IMPRESSION: 1. No acute hemorrhage  or definite acute infarct. 2. Advanced chronic small vessel ischemia which could easily obscure an acute white matter or deep gray nucleus infarct. 3. Small remote appearing right frontal cortex infarct. Electronically Signed   By: Marnee Spring M.D.   On: 10/17/2016 09:42   Ct Head Wo Contrast  Result Date: 10/17/2016 CLINICAL DATA:  Right-sided weakness and facial droop with leaning. Symptoms since waking. EXAM: CT HEAD WITHOUT CONTRAST TECHNIQUE: Contiguous axial images were obtained from the base of the skull through the vertex without intravenous contrast. COMPARISON:  None. FINDINGS: Brain: No evidence of acute cortical infarction. No hemorrhage, hydrocephalus, or masslike findings. There is advanced small-vessel type ischemic injury in the cerebral white matter and likely right pons. In general these changes are chronic, but specific areas of insults are age-indeterminate by CT. Small remote appearing high right frontal cortex infarct. Vascular: Atherosclerotic calcification. Vessels appear large in tortuous, which can be a sign of chronic hypertension. Skull: No acute or aggressive finding. Sinuses/Orbits: Negative. IMPRESSION: 1. No acute hemorrhage or definite acute infarct. 2. Advanced chronic small vessel ischemia which could easily obscure an acute white matter or deep gray nucleus infarct. 3. Small remote appearing right frontal cortex infarct. Electronically Signed   By: Marnee Spring M.D.   On: 10/17/2016 09:42      EKG: Independently reviewed.    Assessment/Plan Principal Problem:   Probable acute CVA (cerebral vascular accident) [facility]) Stroke order set initiated MRI/MRA of the brain pending Carotid Doppler 2-D echo PT/OT/speech Patient started on aspirin 325 mg a day Neurology has been consulted by EDP       Depression-continue Xanax  CK D stage III-baseline creatinine unknown, creatinine 1.8 today Patient appears dehydrated, check CK  Troponin elevation EKG shows  subtle T-wave inversions which could   be in the setting of stroke versus elevated CK Cardiology has been consulted by EDP   DVT prophylaxis: * Heparin     Code Status OrdersFull code          consults called: Neurology  Family Communication: Admission, patients condition and plan of care including tests being ordered have been discussed with the patient  who indicates understanding and agree with the plan and Code Status  Admission status: inpatient   Disposition plan: Further plan will depend as patient's clinical course evolves and further radiologic and laboratory data become available. Likely home when stable   At the time of admission, it appears that the appropriate admission status for this patient is INPATIENT .Thisis judged to be reasonable and necessary in order to provide the required intensity of service to ensure the patient's safetygiven thepresenting symptoms, physical exam findings, and initial radiographic and laboratory data in the context of their chronic comorbidities.   Richarda Overlie MD [facility] Pager [phone]  If 7PM-7AM, please contact night-coverage [email or web address] Password [email or web address]  10/17/2016, 1:04 PM

## 2016-10-17 NOTE — Progress Notes (Signed)
Recent stroke. Mild trop elevation without any ongoing signs of ischemia. Admit to hospitalist/neurology. Echocardiogram now, stress test in a few days. Bleeding risks outweighs benefits of coronary angiography/angioplasty at this time.  Full consult note to follow.  Elder Negus, MD [facility] [place] Pager: [phone] Office: [phone] If no answer Cell [phone]

## 2016-10-17 NOTE — Progress Notes (Addendum)
BP 193/111. Goal SBP per report is 180. No PRNs in place. On call notified.

## 2016-10-17 NOTE — ED Notes (Signed)
Report attempted at this time.

## 2016-10-17 NOTE — ED Provider Notes (Signed)
[facility]-EMERGENCY DEPT Provider Note   CSN: [id number] Arrival date & time: 10/17/16  4098     History   Chief Complaint Chief Complaint  Patient presents with  . Cerebrovascular Accident    HPI Brandon [name] is a 61 y.o. male.  HPI Patient is a very poor historian. Supposedly they found on floor by his father and EMS was called. He has right-sided arm and leg pain which she believes started 24-48 hours ago. He is unsure of the timing. He is denying any pain, numbness or visual changes. States he is taking no medications Past Medical History:  Diagnosis Date  . Depression   . Hx of pleural effusion   . Hyperlipidemia   . Hypertension     Patient Active Problem List   Diagnosis Date Noted  . Right flaccid hemiplegia (HCC)   . CVA (cerebral vascular accident) (HCC) 10/17/2016  . Depression 05/20/2011  . ETOH abuse 05/20/2011  . Renal insufficiency 05/20/2011  . Obesity 05/20/2011  . HYPERLIPIDEMIA 04/22/2008  . HYPERTENSION 04/22/2008  . PLEURAL EFFUSION, LEFT 04/22/2008    Past Surgical History:  Procedure Laterality Date  . THORACENTESIS         Home Medications    Prior to Admission medications   Medication Sig Start Date End Date Taking? Authorizing Provider  diphenhydrAMINE (BENADRYL) 25 mg capsule Take 25 mg by mouth every 6 (six) hours as needed for allergies.   Yes [provider]  ALPRAZolam (XANAX) 0.5 MG tablet Take 1 tablet (0.5 mg total) by mouth at bedtime as needed. Patient not taking: Reported on 10/17/2016 05/20/11   Porfirio Oar, PA-C  benazepril (LOTENSIN) 40 MG tablet Take 1 tablet (40 mg total) by mouth daily. Needs office visit. Patient not taking: Reported on 10/17/2016 01/10/12   Sondra Barges, PA-C    Family History History reviewed. No pertinent family history.  Social History Social History  Substance Use Topics  . Smoking status: Never Smoker  . Smokeless tobacco: Never Used  . Alcohol use Yes     Allergies     Patient has no known allergies.   Review of Systems Review of Systems  Constitutional: Negative for chills and fever.  Eyes: Negative for visual disturbance.  Respiratory: Negative for shortness of breath.   Cardiovascular: Negative for chest pain and leg swelling.  Gastrointestinal: Negative for abdominal pain, nausea and vomiting.  Musculoskeletal: Positive for gait problem. Negative for back pain and neck pain.  Skin: Negative for rash and wound.  Neurological: Positive for speech difficulty and weakness. Negative for dizziness, light-headedness, numbness and headaches.  Psychiatric/Behavioral: Positive for confusion.  All other systems reviewed and are negative.    Physical Exam Updated Vital Signs BP (!) 165/101 (BP Location: Left Arm)   Pulse 81   Temp 97.9 F (36.6 C) (Oral)   Resp 20   Ht  (1.803 m)   Wt 115.2 kg (254 lb)   SpO2 97%   BMI 35.43 kg/m   Physical Exam  Constitutional: He is oriented to person, place, and time. He appears well-developed and well-nourished.  Disheveled appearing  HENT:  Head: Normocephalic and atraumatic.  Mouth/Throat: Oropharynx is clear and moist.  No intraoral trauma. Right lower facial droop.   Eyes: Pupils are equal, round, and reactive to light. EOM are normal.  Neck: Normal range of motion. Neck supple.  No posterior midline cervical tenderness to palpation. No meningismus.  Cardiovascular: Normal rate and regular rhythm.  Exam reveals no gallop  and no friction rub.   No murmur heard. Pulmonary/Chest: Effort normal and breath sounds normal. No respiratory distress. He has no wheezes. He has no rales. He exhibits no tenderness.  Abdominal: Soft. Bowel sounds are normal. There is no tenderness. There is no rebound and no guarding.  Musculoskeletal: Normal range of motion. He exhibits no edema or tenderness.  No midline thoracic or lumbar tenderness. No lower extremity swelling or asymmetry. Distal pulses are 2+.   Neurological: He is alert and oriented to person, place, and time.  1/5 motor in the right upper and right lower extremity. 4/5 motor in left upper and lower extremities. Sensation to light touch is fully intact. Patient has upgoing Babinski on the right. Question mild slurred speech.  Skin: Skin is warm and dry. Capillary refill takes less than 2 seconds. No rash noted. No erythema.  Nursing note and vitals reviewed.    ED Treatments / Results  Labs (all labs ordered are listed, but only abnormal results are displayed) Labs Reviewed  PROTIME-INR - Abnormal; Notable for the following:       Result Value   Prothrombin Time 15.6 (*)    All other components within normal limits  CBC - Abnormal; Notable for the following:    WBC 12.3 (*)    Hemoglobin 17.8 (*)    MCH 34.6 (*)    MCHC 36.8 (*)    All other components within normal limits  DIFFERENTIAL - Abnormal; Notable for the following:    Neutro Abs 8.1 (*)    Eosinophils Absolute 0.8 (*)    All other components within normal limits  COMPREHENSIVE METABOLIC PANEL - Abnormal; Notable for the following:    Potassium 3.4 (*)    Glucose, Bld 101 (*)    BUN 48 (*)    Creatinine, Ser 1.80 (*)    AST 59 (*)    Total Bilirubin 1.4 (*)    GFR calc non Af Amer 39 (*)    GFR calc Af Amer 45 (*)    All other components within normal limits  URINALYSIS, ROUTINE W REFLEX MICROSCOPIC - Abnormal; Notable for the following:    APPearance HAZY (*)    Ketones, ur 5 (*)    Protein, ur 100 (*)    Squamous Epithelial / LPF 0-5 (*)    All other components within normal limits  TROPONIN I - Abnormal; Notable for the following:    Troponin I 0.21 (*)    All other components within normal limits  BASIC METABOLIC PANEL - Abnormal; Notable for the following:    BUN 33 (*)    Creatinine, Ser 1.56 (*)    Calcium 8.7 (*)    GFR calc non Af Amer 47 (*)    GFR calc Af Amer 54 (*)    All other components within normal limits  TROPONIN I - Abnormal;  Notable for the following:    Troponin I 0.10 (*)    All other components within normal limits  TROPONIN I - Abnormal; Notable for the following:    Troponin I 0.10 (*)    All other components within normal limits  TROPONIN I - Abnormal; Notable for the following:    Troponin I 0.11 (*)    All other components within normal limits  HEMOGLOBIN A1C - Abnormal; Notable for the following:    Hgb A1c MFr Bld 4.7 (*)    All other components within normal limits  LIPID PANEL - Abnormal; Notable for the following:  Triglycerides 232 (*)    HDL 20 (*)    VLDL 46 (*)    LDL Cholesterol 111 (*)    All other components within normal limits  BASIC METABOLIC PANEL - Abnormal; Notable for the following:    Potassium 3.3 (*)    Glucose, Bld 102 (*)    BUN 27 (*)    Creatinine, Ser 1.54 (*)    Calcium 8.3 (*)    GFR calc non Af Amer 47 (*)    GFR calc Af Amer 55 (*)    All other components within normal limits  BASIC METABOLIC PANEL - Abnormal; Notable for the following:    Chloride 112 (*)    Glucose, Bld 101 (*)    Creatinine, Ser 1.51 (*)    Calcium 8.6 (*)    GFR calc non Af Amer 49 (*)    GFR calc Af Amer 56 (*)    All other components within normal limits  I-STAT TROPONIN, ED - Abnormal; Notable for the following:    Troponin i, poc 0.40 (*)    All other components within normal limits  CBG MONITORING, ED - Abnormal; Notable for the following:    Glucose-Capillary 107 (*)    All other components within normal limits  ETHANOL  APTT  RAPID URINE DRUG SCREEN, HOSP PERFORMED  HIV ANTIBODY (ROUTINE TESTING)  CBC  CK  CBC  CK  CBC  TROPONIN I  URINALYSIS, COMPLETE (UACMP) WITH MICROSCOPIC  CK    EKG  EKG Interpretation  Date/Time:  Wednesday October 17 2016 08:46:09 EDT Ventricular Rate:  79 PR Interval:    QRS Duration: 102 QT Interval:  400 QTC Calculation: 459 R Axis:   -22 Text Interpretation:  Sinus rhythm LVH with secondary repolarization abnormality  Confirmed by Ranae Palms  MD, Seymore Brodowski ([id number]) on 10/17/2016 10:59:45 AM       Radiology Dg Ankle 2 Views Right  Result Date: 10/18/2016 CLINICAL DATA:  Right ankle pain.  No injury. EXAM: RIGHT ANKLE - 2 VIEW COMPARISON:  None. FINDINGS: There is no evidence of fracture, dislocation, or joint effusion. The talar dome is intact. The ankle mortise is symmetric. Achilles and plantar enthesopathy. Dorsal spurring of the talar neck. Bone mineralization is normal. Soft tissues are unremarkable. IMPRESSION: No acute osseous abnormality. Electronically Signed   By: Obie Dredge M.D.   On: 10/18/2016 13:36    Procedures Procedures (including critical care time)  Medications Ordered in ED Medications  0.9 % NaCl with KCl 40 mEq / L  infusion (75 mL/hr Intravenous New Bag/Given 10/20/16 0336)  aspirin tablet 325 mg ( Oral MAR Unhold 10/19/16 1607)   stroke: mapping our early stages of recovery book ( Does not apply MAR Unhold 10/19/16 1607)  acetaminophen (TYLENOL) tablet 650 mg ( Oral MAR Unhold 10/19/16 1607)    Or  acetaminophen (TYLENOL) solution 650 mg ( Per Tube MAR Unhold 10/19/16 1607)    Or  acetaminophen (TYLENOL) suppository 650 mg ( Rectal MAR Unhold 10/19/16 1607)  senna-docusate (Senokot-S) tablet 1 tablet (1 tablet Oral Given 10/19/16 2142)  ALPRAZolam (XANAX) tablet 0.5 mg ( Oral MAR Unhold 10/19/16 1607)  ondansetron (ZOFRAN) injection 4 mg ( Intravenous MAR Unhold 10/19/16 1607)  heparin injection 5,000 Units (5,000 Units Subcutaneous Given 10/20/16 0337)  perflutren lipid microspheres (DEFINITY) IV suspension (2 mLs Intravenous Given 10/18/16 0834)  hydrALAZINE (APRESOLINE) injection 5 mg ( Intravenous MAR Unhold 10/19/16 1607)  atorvastatin (LIPITOR) tablet 40 mg (not administered)  labetalol (NORMODYNE,TRANDATE) injection  10 mg (10 mg Intravenous Given 10/17/16 0957)  nicardipine (CARDENE)  in 0.86% saline IV infusion (0.1 mg/ml) (0 mg/hr Intravenous Stopped 10/17/16 1345)    thiamine (B-1) injection 100 mg (100 mg Intravenous Given 10/17/16 1116)  gadobenate dimeglumine (MULTIHANCE) injection 20 mL (20 mLs Intravenous Contrast Given 10/17/16 1435)  hydrALAZINE (APRESOLINE) injection 10 mg (10 mg Intravenous Given 10/17/16 2330)  potassium chloride 10 mEq in 100 mL IVPB (0 mEq Intravenous Stopped 10/19/16 1630)  hydrALAZINE (APRESOLINE) injection 10 mg (10 mg Intravenous Given 10/19/16 1445)  labetalol (NORMODYNE,TRANDATE) injection 15 mg (15 mg Intravenous Given 10/19/16 1445)    CRITICAL CARE Performed by: Ranae Palms, Yong Grieser Total critical care time: 40 minutes Critical care time was exclusive of separately billable procedures and treating other patients. Critical care was necessary to treat or prevent imminent or life-threatening deterioration. Critical care was time spent personally by me on the following activities: development of treatment plan with patient and/or surrogate as well as nursing, discussions with consultants, evaluation of patient's response to treatment, examination of patient, obtaining history from patient or surrogate, ordering and performing treatments and interventions, ordering and review of laboratory studies, ordering and review of radiographic studies, pulse oximetry and re-evaluation of patient's condition. Initial Impression / Assessment and Plan / ED Course  I have reviewed the triage vital signs and the nursing notes.  Pertinent labs & imaging results that were available during my care of the patient were reviewed by me and considered in my medical decision making (see chart for details).      Given IV dose of labetalol with some improvement of blood pressure. Given concern for hypertensive emergency services on Cardene drip. Blood pressure significantly improved and drip is being weaned off. Patient discussed with neurology, Dr. Wilford Corner. Recommends MRI MRA head and neck. Will see patient in the emergency department. I have also discussed  with Dr. Glory Rosebush who will consult on the patient. Dr. Susie Cassette, hospitalist will see patient in the emergency department for likely transfer to [facility].   Final Clinical Impressions(s) / ED Diagnoses   Final diagnoses:  CVA (cerebral vascular accident) (HCC)  Pain  Cerebrovascular accident (CVA) due to thrombosis of precerebral artery [facility])    New Prescriptions Current Discharge Medication List       Loren Racer, MD 10/20/16 [phone]

## 2016-10-17 NOTE — ED Triage Notes (Signed)
Pt reports onset of symptoms 10/15/2016 upon waking. Pt reports right sided weakness, right sided facial droop, and right sided leaning. Speech is clear and no visual or sensory  deficits noted per EMS personnel.

## 2016-10-17 NOTE — ED Notes (Signed)
Bed: WA05 Expected date:  Expected time:  Means of arrival:  Comments: EMS-weakness 

## 2016-10-17 NOTE — ED Notes (Signed)
EDP Yelver[place] and RN Cherelle notified of elevated troponin- 0.40

## 2016-10-17 NOTE — Consult Note (Addendum)
Neurology Consultation  Reason for Consult: Stroke Referring Physician: Dr. Ranae Palms  CC: Right-sided weakness  History is obtained from: Patient  HPI: Brandon [name] is a 61 y.o. male  was a past medical history of hypertension, hyperlipidemia, depression, alcohol abuse, chronic kidney disease and obesity, who was found on the floor by his family. EMS was called and he was brought to [facility]. Patient was noted to have right-sided weakness. He said he was normal up until sometime on Monday, 10/15/2016 when he started noticing some weakness on the right side of his body, and gradually was unable to move the right side at all. Does not report any headaches. Does not report blurry vision. Does not report word finding difficulty. He was hypertensive on arrival at the emergency room. Also had elevated troponin. Noncontrast CT of the head was done that did not show any evidence of acute stroke. MRI of the brain was done, that showed multiple acute/subacute ischemic infarcts in bilateral cerebral hemispheres and left cerebellar hemisphere.   LKW: At least 48 hours prior to presentation tpa given?: no, outside window Premorbid modified Rankin scale (mRS): 0-Completely asymptomatic and back to baseline post-stroke  ROS: A 14 point ROS was performed and is negative except as noted in the HPI.Marland Kitchen   Past Medical History:  Diagnosis Date  . Depression   . Hx of pleural effusion   . Hyperlipidemia   . Hypertension     History reviewed. No pertinent family history.  Social History:   reports that he has never smoked. He has never used smokeless tobacco. He reports that he drinks alcohol. He reports that he does not use drugs.  Medications  Current Facility-Administered Medications:  .   stroke: mapping our early stages of recovery book, , Does not apply, Once, Abrol, Nayana, MD .  0.9 % NaCl with KCl 40 mEq / L  infusion, , Intravenous, Continuous, Abrol, Nayana, MD .  acetaminophen  (TYLENOL) tablet 650 mg, 650 mg, Oral, Q4H PRN **OR** acetaminophen (TYLENOL) solution 650 mg, 650 mg, Per Tube, Q4H PRN **OR** acetaminophen (TYLENOL) suppository 650 mg, 650 mg, Rectal, Q4H PRN, Abrol, Nayana, MD .  aspirin tablet 325 mg, 325 mg, Oral, Daily, Abrol, Nayana, MD .  heparin injection 5,000 Units, 5,000 Units, Subcutaneous, Q8H, Abrol, Nayana, MD .  ondansetron (ZOFRAN) injection 4 mg, 4 mg, Intravenous, Q6H PRN, Abrol, Nayana, MD .  senna-docusate (Senokot-S) tablet 1 tablet, 1 tablet, Oral, BID, Richarda Overlie, MD  Current Outpatient Prescriptions:  .  diphenhydrAMINE (BENADRYL) 25 mg capsule, Take 25 mg by mouth every 6 (six) hours as needed for allergies., Disp: , Rfl:  .  ALPRAZolam (XANAX) 0.5 MG tablet, Take 1 tablet (0.5 mg total) by mouth at bedtime as needed. (Patient not taking: Reported on 10/17/2016), Disp: 30 tablet, Rfl: 0 .  benazepril (LOTENSIN) 40 MG tablet, Take 1 tablet (40 mg total) by mouth daily. Needs office visit. (Patient not taking: Reported on 10/17/2016), Disp: 30 tablet, Rfl: 0  Exam: Current vital signs: BP 118/88   Pulse 84   Resp 13   SpO2 97%  Vital signs in last 24 hours: Pulse Rate:  [60-86] 84 (09/19 1325) Resp:  [12-22] 13 (09/19 1325) BP: (118-204)/(85-166) 118/88 (09/19 1325) SpO2:  [93 %-100 %] 97 % (09/19 1325)  GENERAL: Awake, alert in NAD HEENT: - Normocephalic and atraumatic, dry mm, no LN++, no Thyromegally LUNGS - Clear to auscultation bilaterally with no wheezes CV - S1S2 RRR, no m/r/g, equal pulses  bilaterally. ABDOMEN - Soft, nontender, nondistended with normoactive BS Ext: warm, well perfused, intact peripheral pulses,no  edema NEURO:  Mental Status: AA&Ox3  Language: speech is mildly dysarthric.  Naming, repetition, fluency, and comprehension intact. Cranial Nerves: PERRL31mm/brisk. EOMI, visual fields full, right lower facial weakness,, facial sensation intact, hearing intact, tongue/uvula/soft palate midline,  normalsternocleidomastoid and trapezius muscle strength. No evidence of tongue atrophy or fibrillations Motor: 0/5 right upper extremity, 1/5 right lower extremity. 5/5 left upper and lower extremity. Tone: is normal and bulk is normal Sensation- decreased sensation to light touch on the right. Coordination: Normal finger to nose on the right Gait- deferred  NIHSS 1a Level of Conscious.: 0 1b LOC Questions: 0 1c LOC Commands: 0 2 Best Gaze: 0 3 Visual: 0 4 Facial Palsy: 1 5a Motor Arm - left: 0 5b Motor Arm - Right: 4 6a Motor Leg - Left: 0 6b Motor Leg - Right: 3 7 Limb Ataxia: 0 8 Sensory: 4 9 Best Language: 0 10 Dysarthria: 4 11 Extinct. and Inatten.: 0 TOTAL: 16  Labs I have reviewed labs in epic and the results pertinent to this consultation are:  CBC    Component Value Date/Time   WBC 12.3 (H) 10/17/2016 0850   RBC 5.14 10/17/2016 0850   HGB 17.8 (H) 10/17/2016 0850   HCT 48.4 10/17/2016 0850   PLT 257 10/17/2016 0850   MCV 94.2 10/17/2016 0850   MCH 34.6 (H) 10/17/2016 0850   MCHC 36.8 (H) 10/17/2016 0850   RDW 12.5 10/17/2016 0850   LYMPHSABS 2.4 10/17/2016 0850   MONOABS 1.0 10/17/2016 0850   EOSABS 0.8 (H) 10/17/2016 0850   BASOSABS 0.1 10/17/2016 0850   CMP     Component Value Date/Time   NA 142 10/17/2016 0850   K 3.4 (L) 10/17/2016 0850   CL 105 10/17/2016 0850   CO2 27 10/17/2016 0850   GLUCOSE 101 (H) 10/17/2016 0850   BUN 48 (H) 10/17/2016 0850   CREATININE 1.80 (H) 10/17/2016 0850   CALCIUM 9.2 10/17/2016 0850   PROT 8.0 10/17/2016 0850   ALBUMIN 3.7 10/17/2016 0850   AST 59 (H) 10/17/2016 0850   ALT 48 10/17/2016 0850   ALKPHOS 72 10/17/2016 0850   BILITOT 1.4 (H) 10/17/2016 0850   GFRNONAA 39 (L) 10/17/2016 0850   GFRAA 45 (L) 10/17/2016 0850    Imaging I have reviewed the images obtained: CT-scan of the brain - no acute changes MRI examination of the brain scattered infarcts in both cerebral hemispheres and left cerebellar.   MRA of the brain significant for no large vessel occlusion. There is some short segment bilaterally to and proximal left M2 stenosis but images are not of greatest quality because of motion. MRA of the neck has a short segment proximal right V1 stenosis and hypoplastic right vertebral.  Assessment:  61 year old man with a past medical history of hypertension, hyperlipidemia, depression and alcohol use chronic kidney disease and obesity found down on the floor by family, noted to have right facial droop, right arm and leg weakness along with some dysarthria. No aphasia noted. He has acute ischemic stroke involving his both cerebral hemispheres and left cerebellum. Most likel cardioembolic versus atheroembolic..  Impression: Acute ischemic stroke  Recommendations: HgbA1c, fasting lipid panel Frequent neuro checks Echocardiogram Prophylactic therapy - Antiplatelet med: Aspirin - dose  PO or  PR Risk factor modification Telemetry monitoring PT consult, OT consult, Speech consult UDS  Please page stroke NP  Or  PA  Or MD  from 8am -4 pm as this patient will be followed by the stroke team at this point.   You can look them up on [email or web address]    -- Milon Dikes, MD [facility] [phone]  If 7pm to 7am, please call on call as listed on AMION.

## 2016-10-17 NOTE — Consult Note (Signed)
Reason for Consult:Troponin elevation, EKG abnormality Referring Physician: Julianne Rice, MDO:  Brandon [name] is an 61 y.o. male.  HPI:   61 year-old Caucasian male with history of uncontrolled hypertension, hyperlipidemia, previously untreated. He presented to the ED today after 3 days of right-sided weakness. He did not seek immediate medical treatment then. On arrival, he has profound right-sided weakness and also some speech difficulties. He denies any chest pain, shortness of breath orthopnea, PND, leg swelling.  EKG shows LVH without any signs of ischemia.  Previous echocardiogram 2010 showed EF of 55%.   CT head  IMPRESSION: 1. No acute hemorrhage or definite acute infarct. 2. Advanced chronic small vessel ischemia which could easily obscure an acute white matter or deep gray nucleus infarct. 3. Small remote appearing right frontal cortex infarct.   Past Medical History:  Diagnosis Date  . Depression   . Hx of pleural effusion   . Hyperlipidemia   . Hypertension     Past Surgical History:  Procedure Laterality Date  . THORACENTESIS      History reviewed. No pertinent family history.  Lives with his 74 y/o father  Social History:  reports that he has never smoked. He has never used smokeless tobacco. He reports that he drinks alcohol. He reports that he does not use drugs.  However, prior tobacco and drug abuse noted on previous hospitalizations  Allergies: No Known Allergies  Medications: I have reviewed the patient's current medications.  Results for orders placed or performed during the hospital encounter of 10/17/16 (from the past 48 hour(s))  Ethanol     Status: None   Collection Time: 10/17/16  8:50 AM  Result Value Ref Range   Alcohol, Ethyl (B) <5 <5 mg/dL    Comment:        LOWEST DETECTABLE LIMIT FOR SERUM ALCOHOL IS 5 mg/dL FOR MEDICAL PURPOSES ONLY   Protime-INR     Status: Abnormal   Collection Time: 10/17/16  8:50 AM  Result Value Ref Range    Prothrombin Time 15.6 (H) 11.4 - 15.2 seconds   INR 1.25   APTT     Status: None   Collection Time: 10/17/16  8:50 AM  Result Value Ref Range   aPTT 26 24 - 36 seconds  CBC     Status: Abnormal   Collection Time: 10/17/16  8:50 AM  Result Value Ref Range   WBC 12.3 (H) 4.0 - 10.5 K/uL   RBC 5.14 4.22 - 5.81 MIL/uL   Hemoglobin 17.8 (H) 13.0 - 17.0 g/dL   HCT 48.4 39.0 - 52.0 %   MCV 94.2 78.0 - 100.0 fL   MCH 34.6 (H) 26.0 - 34.0 pg   MCHC 36.8 (H) 30.0 - 36.0 g/dL   RDW 12.5 11.5 - 15.5 %   Platelets 257 150 - 400 K/uL  Differential     Status: Abnormal   Collection Time: 10/17/16  8:50 AM  Result Value Ref Range   Neutrophils Relative % 66 %   Neutro Abs 8.1 (H) 1.7 - 7.7 K/uL   Lymphocytes Relative 20 %   Lymphs Abs 2.4 0.7 - 4.0 K/uL   Monocytes Relative 8 %   Monocytes Absolute 1.0 0.1 - 1.0 K/uL   Eosinophils Relative 6 %   Eosinophils Absolute 0.8 (H) 0.0 - 0.7 K/uL   Basophils Relative 0 %   Basophils Absolute 0.1 0.0 - 0.1 K/uL  Comprehensive metabolic panel     Status: Abnormal   Collection Time: 10/17/16  8:50 AM  Result Value Ref Range   Sodium 142 135 - 145 mmol/L   Potassium 3.4 (L) 3.5 - 5.1 mmol/L   Chloride 105 101 - 111 mmol/L   CO2 27 22 - 32 mmol/L   Glucose, Bld 101 (H) 65 - 99 mg/dL   BUN 48 (H) 6 - 20 mg/dL   Creatinine, Ser 1.80 (H) 0.61 - 1.24 mg/dL   Calcium 9.2 8.9 - 10.3 mg/dL   Total Protein 8.0 6.5 - 8.1 g/dL   Albumin 3.7 3.5 - 5.0 g/dL   AST 59 (H) 15 - 41 U/L   ALT 48 17 - 63 U/L   Alkaline Phosphatase 72 38 - 126 U/L   Total Bilirubin 1.4 (H) 0.3 - 1.2 mg/dL   GFR calc non Af Amer 39 (L) >60 mL/min   GFR calc Af Amer 45 (L) >60 mL/min    Comment: (NOTE) The eGFR has been calculated using the CKD EPI equation. This calculation has not been validated in all clinical situations. eGFR's persistently <60 mL/min signify possible Chronic Kidney Disease.    Anion gap 10 5 - 15  I-stat troponin, ED     Status: Abnormal   Collection  Time: 10/17/16  8:51 AM  Result Value Ref Range   Troponin i, poc 0.40 (HH) 0.00 - 0.08 ng/mL   Comment NOTIFIED PHYSICIAN    Comment 3            Comment: Due to the release kinetics of cTnI, a negative result within the first hours of the onset of symptoms does not rule out myocardial infarction with certainty. If myocardial infarction is still suspected, repeat the test at appropriate intervals.     Ct Head Wo Contrast  Result Date: 10/17/2016 CLINICAL DATA:  Right-sided weakness and facial droop with leaning. Symptoms since waking. EXAM: CT HEAD WITHOUT CONTRAST TECHNIQUE: Contiguous axial images were obtained from the base of the skull through the vertex without intravenous contrast. COMPARISON:  None. FINDINGS: Brain: No evidence of acute cortical infarction. No hemorrhage, hydrocephalus, or masslike findings. There is advanced small-vessel type ischemic injury in the cerebral white matter and likely right pons. In general these changes are chronic, but specific areas of insults are age-indeterminate by CT. Small remote appearing high right frontal cortex infarct. Vascular: Atherosclerotic calcification. Vessels appear large in tortuous, which can be a sign of chronic hypertension. Skull: No acute or aggressive finding. Sinuses/Orbits: Negative. IMPRESSION: 1. No acute hemorrhage or definite acute infarct. 2. Advanced chronic small vessel ischemia which could easily obscure an acute white matter or deep gray nucleus infarct. 3. Small remote appearing right frontal cortex infarct. Electronically Signed   By: Monte Fantasia M.D.   On: 10/17/2016 09:42    Review of Systems  HENT: Negative.   Respiratory: Negative for shortness of breath.   Cardiovascular: Negative for chest pain, palpitations, orthopnea, leg swelling and PND.  Gastrointestinal: Negative.   Genitourinary: Negative.   Musculoskeletal: Negative.   Skin: Negative.   Neurological: Positive for focal weakness (Rt sided) and  weakness. Negative for loss of consciousness.  Psychiatric/Behavioral: Positive for depression.  All other systems reviewed and are negative.  Blood pressure (!) 162/90, pulse 81, resp. rate 14, SpO2 98 %. Physical Exam  Nursing note and vitals reviewed. Constitutional: He is oriented to person, place, and time. He appears well-developed and well-nourished.  HENT:  Head: Normocephalic and atraumatic.  Eyes: Pupils are equal, round, and reactive to light.  Neck: No JVD present.  Cardiovascular: Normal rate, regular rhythm and normal heart sounds.   No murmur heard. Respiratory: Effort normal and breath sounds normal. He has no wheezes. He has no rales.  GI: Soft. Bowel sounds are normal. There is no tenderness.  Musculoskeletal: He exhibits no edema.  Neurological: He is alert and oriented to person, place, and time.  Motor: Rt UE+LE 0/5. LUE 5/5, LLE 4/5 Rt sided facial droop Grossly intact sensory Mild dysarthria and word recall  Skin: Skin is warm and dry.   EKG 10/17/2016: Sinus rhythm 79 bpm.  Borderline left axis deviation.  LVH.  ST T changes likely secondary to LVH.  Echocardiogram 2010: SUMMARY Overall left ventricular systolic function was normal. Left ventricular ejection fraction was estimated to be 55 %. - The aortic valve was mildly calcified.   Assessment: Right-sided hemiplegia, likely subacute ischemic CVA Hypertensive urgency. Mild troponin elevation: Supply demand ischemia type 2 MI, in the setting of the above. Less likely to be a primary plaque rupture event. Hyperlipdemia  Recommendations: An absence of ongoing symptoms of ischemia, do  Invasive management for type II MI. Recommend aspirin and Plavix.  Hold heparin until size of infarct is noted.   Risk of hemorrhage with anticoagulation in the setting of large infarct. A stable troponin and no chest pain, will obtain a stress test before discharge.  Will check lipid panel and echocardiogram.  Permissive hypertension in the setting of recent stroke as you are doing.  We will continue to follow the patient with you.  Damisha Wolff J Bryley Kovacevic 10/17/2016, 1:27 PM   [place], MD [facility] Pager: [phone] Office: [phone] If no answer Cell [phone]

## 2016-10-18 ENCOUNTER — Inpatient Hospital Stay (HOSPITAL_COMMUNITY): Payer: Medicaid Other

## 2016-10-18 DIAGNOSIS — I6300 Cerebral infarction due to thrombosis of unspecified precerebral artery: Secondary | ICD-10-CM

## 2016-10-18 DIAGNOSIS — R52 Pain, unspecified: Secondary | ICD-10-CM

## 2016-10-18 DIAGNOSIS — I639 Cerebral infarction, unspecified: Secondary | ICD-10-CM

## 2016-10-18 DIAGNOSIS — R748 Abnormal levels of other serum enzymes: Secondary | ICD-10-CM

## 2016-10-18 DIAGNOSIS — G8101 Flaccid hemiplegia affecting right dominant side: Secondary | ICD-10-CM

## 2016-10-18 DIAGNOSIS — N183 Chronic kidney disease, stage 3 (moderate): Secondary | ICD-10-CM

## 2016-10-18 LAB — BASIC METABOLIC PANEL
Anion gap: 9 (ref 5–15)
BUN: 33 mg/dL — ABNORMAL HIGH (ref 6–20)
CHLORIDE: 111 mmol/L (ref 101–111)
CO2: 23 mmol/L (ref 22–32)
CREATININE: 1.56 mg/dL — AB (ref 0.61–1.24)
Calcium: 8.7 mg/dL — ABNORMAL LOW (ref 8.9–10.3)
GFR, EST AFRICAN AMERICAN: 54 mL/min — AB (ref >60)
GFR, EST NON AFRICAN AMERICAN: 47 mL/min — AB (ref >60)
Glucose, Bld: 97 mg/dL (ref 65–99)
POTASSIUM: 3.5 mmol/L (ref 3.5–5.1)
SODIUM: 143 mmol/L (ref 135–145)

## 2016-10-18 LAB — CBC
HEMATOCRIT: 45.2 % (ref 39.0–52.0)
Hemoglobin: 15.9 g/dL (ref 13.0–17.0)
MCH: 33.8 pg (ref 26.0–34.0)
MCHC: 35.2 g/dL (ref 30.0–36.0)
MCV: 96.2 fL (ref 78.0–100.0)
Platelets: 219 10*3/uL (ref 150–400)
RBC: 4.7 MIL/uL (ref 4.22–5.81)
RDW: 12.7 % (ref 11.5–15.5)
WBC: 9.3 10*3/uL (ref 4.0–10.5)

## 2016-10-18 LAB — HEMOGLOBIN A1C
HEMOGLOBIN A1C: 4.7 % — AB (ref 4.8–5.6)
MEAN PLASMA GLUCOSE: 88.19 mg/dL

## 2016-10-18 LAB — VAS US CAROTID
LCCADSYS: -68 cm/s
LEFT ECA DIAS: -17 cm/s
LEFT VERTEBRAL DIAS: -15 cm/s
Left CCA dist dias: -15 cm/s
Left CCA prox dias: 19 cm/s
Left CCA prox sys: 96 cm/s
Left ICA dist dias: -15 cm/s
Left ICA dist sys: -51 cm/s
Left ICA prox dias: -14 cm/s
Left ICA prox sys: -46 cm/s
RCCAPDIAS: -17 cm/s
RCCAPSYS: -94 cm/s
RIGHT ECA DIAS: -19 cm/s
RIGHT VERTEBRAL DIAS: -8 cm/s
Right cca dist sys: -68 cm/s

## 2016-10-18 LAB — LIPID PANEL
Cholesterol: 177 mg/dL (ref 0–200)
HDL: 20 mg/dL — ABNORMAL LOW (ref >40)
LDL Cholesterol: 111 mg/dL — ABNORMAL HIGH (ref 0–99)
Total CHOL/HDL Ratio: 8.9 RATIO
Triglycerides: 232 mg/dL — ABNORMAL HIGH (ref <150)
VLDL: 46 mg/dL — ABNORMAL HIGH (ref 0–40)

## 2016-10-18 LAB — TROPONIN I
TROPONIN I: 0.1 ng/mL — AB (ref <0.03)
Troponin I: 0.1 ng/mL (ref <0.03)

## 2016-10-18 LAB — HIV ANTIBODY (ROUTINE TESTING W REFLEX): HIV SCREEN 4TH GENERATION: NONREACTIVE

## 2016-10-18 MED ORDER — HYDRALAZINE HCL 20 MG/ML IJ SOLN
5.0000 mg | Freq: Four times a day (QID) | INTRAMUSCULAR | Status: DC | PRN
  Administered 2016-10-19 (×2): 5 mg via INTRAVENOUS
  Filled 2016-10-18: qty 1

## 2016-10-18 MED ORDER — PERFLUTREN LIPID MICROSPHERE
1.0000 mL | INTRAVENOUS | Status: AC | PRN
  Administered 2016-10-18: 2 mL via INTRAVENOUS
  Filled 2016-10-18: qty 10

## 2016-10-18 NOTE — Progress Notes (Signed)
Pt elevated BP 185/110. MD notified.

## 2016-10-18 NOTE — Progress Notes (Signed)
*  PRELIMINARY RESULTS* Vascular Ultrasound Carotid Duplex (Doppler) has been completed.  Preliminary findings: Bilateral 1-39% ICA stenosis, antegrade vertebral flow.   Chauncey Fischer 10/18/2016, 8:27 AM

## 2016-10-18 NOTE — Progress Notes (Signed)
RN received critical result from Lab. Troponin 0.10. MD notified

## 2016-10-18 NOTE — Consult Note (Signed)
Physical Medicine and Rehabilitation Consult  Reason for Consult: Deficits in mobility due to stroke with left sided weakness with dysarthria.  Referring Physician: Dr. Catha Gosselin.    HPI: Brandon [name] is a 61 y.o. male with history of depression, HTN, alcohol abuse, CKD, recent gout flare with difficulty walking;  who was found on the floor by his father with right sided weakness.  Onset reported to be 2-3 days PTA. UDS negative.  CT head negative for acute changes. MRI/MRA brain revealed Multifocal acute to early subacute ischemic infarcts involving bilateral cerebral and left cerebellar hemispheres--most prominent 2 cm infarct at the posterior left lentiform nucleus/corona radiata possible central thromboembolic etiology. 2 D echo done today. Carotid dopplers without significant ICA stenosis. Cardiology felt that mild elevation in troponin due to demand ischemia.  Neurology following for work up. Therapy evaluations done today revealing deficits in mobility as well as dysarthria. CIR recommended for follow up therapy.    Recent gout in the left foot, but since his stroke appears to be affecting his right foot  Review of Systems  HENT: Negative for hearing loss.   Eyes: Negative for blurred vision and double vision.  Respiratory: Negative for cough and hemoptysis.   Cardiovascular: Negative for chest pain and palpitations.  Gastrointestinal: Negative for abdominal pain, heartburn and nausea.  Genitourinary: Negative for dysuria and urgency.  Musculoskeletal: Positive for back pain. Negative for myalgias.  Skin: Negative for itching and rash.  Neurological: Positive for speech change, focal weakness and weakness. Sensory change: decreased sensation on right.  Psychiatric/Behavioral: The patient does not have insomnia.       Past Medical History:  Diagnosis Date  . Depression   . Hx of pleural effusion   . Hyperlipidemia   . Hypertension     Past Surgical History:    Procedure Laterality Date  . THORACENTESIS      History reviewed. No pertinent family history.    Social History:  Single. Lives with father. Has been unemployed for 2 years--used to work as a Horticulturist, commercial. he reports that he has never smoked. He has never used smokeless tobacco. He reports that he drinks alcohol. He reports that he does not use drugs.    Allergies: No Known Allergies    Medications Prior to Admission  Medication Sig Dispense Refill  . diphenhydrAMINE (BENADRYL) 25 mg capsule Take 25 mg by mouth every 6 (six) hours as needed for allergies.    . ALPRAZolam (XANAX) 0.5 MG tablet Take 1 tablet (0.5 mg total) by mouth at bedtime as needed. (Patient not taking: Reported on 10/17/2016) 30 tablet 0  . benazepril (LOTENSIN) 40 MG tablet Take 1 tablet (40 mg total) by mouth daily. Needs office visit. (Patient not taking: Reported on 10/17/2016) 30 tablet 0    Home: Home Living Family/patient expects to be discharged to:: Private residence Living Arrangements: Parent Available Help at Discharge: Family Type of Home: House Home Access: Stairs to enter Secretary/administrator of Steps: 6 Home Layout: Two level, Bed/bath upstairs Alternate Level Stairs-Number of Steps: 12 Alternate Level Stairs-Rails: Can reach both Bathroom Shower/Tub: Engineer, manufacturing systems: Standard  Lives With: Family  Functional History: Prior Function Level of Independence: Independent Functional Status:  Mobility: Bed Mobility Overal bed mobility: Needs Assistance Bed Mobility: Rolling, Sidelying to Sit, Sit to Supine Rolling: Max assist, +2 for physical assistance Sidelying to sit: Max assist, +2 for physical assistance, HOB elevated Sit to supine: Total assist, +2 for physical  assistance General bed mobility comments: Patient able to use LUE and LLE to initiate some movement to EOB, manual assist to place hand on rail. Increased physical assist of 2 persons to roll, elevate trunk to  upright and rotate hips to EOB using chuck pads.  Transfers General transfer comment: defer due to elevate BP in sitting      ADL: ADL Overall ADL's : Needs assistance/impaired Eating/Feeding: Set up, Sitting Eating/Feeding Details (indicate cue type and reason): Using L hand, pt able to self feeding and open can. Grooming: Set up, Bed level Upper Body Bathing: Maximal assistance, Bed level General ADL Comments: Limited evaluation due to BP 185/110 while sitting at EOB. Pt with decreased functional performance due to no movement of R (dominant hand) side.  Pt requiring Max A for dressing, bathing, and toileting. Pt abel to perform self feeding and grooming at bed level.   Cognition: Cognition Overall Cognitive Status: Impaired/Different from baseline Arousal/Alertness: Awake/alert Orientation Level: Oriented to person, Oriented to place, Oriented to situation, Disoriented to time Memory: Impaired Memory Impairment: Storage deficit, Retrieval deficit Decreased Long Term Memory: Functional basic Behaviors: Verbal agitation Cognition Arousal/Alertness: Awake/alert Behavior During Therapy: WFL for tasks assessed/performed Overall Cognitive Status: Impaired/Different from baseline Area of Impairment: Attention, Following commands, Problem solving, Safety/judgement, Awareness, Memory Following Commands: Follows one step commands with increased time Awareness: Emergent Problem Solving: Slow processing, Difficulty sequencing, Requires verbal cues, Requires tactile cues  Blood pressure (!) 177/109, pulse 95, temperature 97.7 F (36.5 C), temperature source Oral, resp. rate 20, height  (1.803 m), weight 115.5 kg (254 lb 10.1 oz), SpO2 98 %. Physical Exam  Nursing note and vitals reviewed. Constitutional: He is oriented to person, place, and time. He appears well-developed and well-nourished.  HENT:  Head: Normocephalic and atraumatic.  Mouth/Throat: Oropharynx is clear and moist.    Eyes: Pupils are equal, round, and reactive to light. Conjunctivae and EOM are normal.  Neck: Normal range of motion. Neck supple.  Cardiovascular: Normal rate and regular rhythm.   Respiratory: Effort normal and breath sounds normal. No stridor. No respiratory distress. He has no wheezes.  GI: Soft. Bowel sounds are normal. He exhibits no distension. There is no tenderness.  Musculoskeletal:  Right shin with abrasions. Right foot arch/heel tender to touch.   Neurological: He is alert and oriented to person, place, and time.  Slow to process and internally distracted. Right facial weakness with minimal dysarthria. Able to follow simple motor commands.   Right sided weakness.   Skin: Skin is warm and dry.  Psychiatric: He has a normal mood and affect. His behavior is normal. Thought content normal.  Increased latency of response time. Motor strength is 0/5 in the right deltoid, biceps, triceps, grip, 0/5 in the right hip flexor, knee extensor, ankle dorsi flexor plantar flexor. Left upper extremities 5/5 in the deltoid by stress of grip. Left lower extremity 5/5 hip flexion, extension, ankle dorsi flexor. Sensation absent to light touch in the right upper limb as well as right lower limb. Tone is reduced in the right upper and right lower limb. [place] skeletal tenderness palpation in the mid foot on the right side. No tenderness over the great toe Cerebellar testing, normal left finger-nose-finger  Results for orders placed or performed during the hospital encounter of 10/17/16 (from the past 24 hour(s))  HIV antibody (Routine Testing)     Status: None   Collection Time: 10/17/16  7:48 PM  Result Value Ref Range   HIV Screen  4th Generation wRfx Non Reactive Non Reactive  Troponin I     Status: Abnormal   Collection Time: 10/17/16  7:48 PM  Result Value Ref Range   Troponin I 0.21 (HH) <0.03 ng/mL  Basic metabolic panel     Status: Abnormal   Collection Time: 10/18/16  7:33 AM  Result  Value Ref Range   Sodium 143 135 - 145 mmol/L   Potassium 3.5 3.5 - 5.1 mmol/L   Chloride 111 101 - 111 mmol/L   CO2 23 22 - 32 mmol/L   Glucose, Bld 97 65 - 99 mg/dL   BUN 33 (H) 6 - 20 mg/dL   Creatinine, Ser 1.61 (H) 0.61 - 1.24 mg/dL   Calcium 8.7 (L) 8.9 - 10.3 mg/dL   GFR calc non Af Amer 47 (L) >60 mL/min   GFR calc Af Amer 54 (L) >60 mL/min   Anion gap 9 5 - 15  CBC     Status: None   Collection Time: 10/18/16  7:33 AM  Result Value Ref Range   WBC 9.3 4.0 - 10.5 K/uL   RBC 4.70 4.22 - 5.81 MIL/uL   Hemoglobin 15.9 13.0 - 17.0 g/dL   HCT 09.6 04.5 - 40.9 %   MCV 96.2 78.0 - 100.0 fL   MCH 33.8 26.0 - 34.0 pg   MCHC 35.2 30.0 - 36.0 g/dL   RDW 81.1 91.4 - 78.2 %   Platelets 219 150 - 400 K/uL   Dg Chest 2 View  Result Date: 10/17/2016 CLINICAL DATA:  Uncontrolled hypertension and right-sided weakness EXAM: CHEST  2 VIEW COMPARISON:  04/28/2008 FINDINGS: Cardiac shadow is at the upper limits of normal in size. The lungs are well aerated bilaterally. Previously seen left basilar changes have resolved in the interval. No acute bony abnormality is seen. IMPRESSION: No active cardiopulmonary disease. Electronically Signed   By: Alcide Clever M.D.   On: 10/17/2016 15:17   Dg Ankle 2 Views Right  Result Date: 10/18/2016 CLINICAL DATA:  Right ankle pain.  No injury. EXAM: RIGHT ANKLE - 2 VIEW COMPARISON:  None. FINDINGS: There is no evidence of fracture, dislocation, or joint effusion. The talar dome is intact. The ankle mortise is symmetric. Achilles and plantar enthesopathy. Dorsal spurring of the talar neck. Bone mineralization is normal. Soft tissues are unremarkable. IMPRESSION: No acute osseous abnormality. Electronically Signed   By: Obie Dredge M.D.   On: 10/18/2016 13:36   Ct Head Wo Contrast  Result Date: 10/17/2016 CLINICAL DATA:  Right-sided weakness and facial droop with leaning. Symptoms since waking. EXAM: CT HEAD WITHOUT CONTRAST TECHNIQUE: Contiguous axial images  were obtained from the base of the skull through the vertex without intravenous contrast. COMPARISON:  None. FINDINGS: Brain: No evidence of acute cortical infarction. No hemorrhage, hydrocephalus, or masslike findings. There is advanced small-vessel type ischemic injury in the cerebral white matter and likely right pons. In general these changes are chronic, but specific areas of insults are age-indeterminate by CT. Small remote appearing high right frontal cortex infarct. Vascular: Atherosclerotic calcification. Vessels appear large in tortuous, which can be a sign of chronic hypertension. Skull: No acute or aggressive finding. Sinuses/Orbits: Negative. IMPRESSION: 1. No acute hemorrhage or definite acute infarct. 2. Advanced chronic small vessel ischemia which could easily obscure an acute white matter or deep gray nucleus infarct. 3. Small remote appearing right frontal cortex infarct. Electronically Signed   By: Marnee Spring M.D.   On: 10/17/2016 09:42   Mr [facility]  Wo Contrast  Result Date: 10/17/2016 CLINICAL DATA:  Initial evaluation for acute right-sided weakness, right-sided facial droop. EXAM: MRI HEAD WITHOUT AND WITH CONTRAST MRA HEAD WITHOUT CONTRAST MRA NECK WITHOUT AND WITH CONTRAST TECHNIQUE: Multiplanar, multiecho pulse sequences of the brain and surrounding structures were obtained without intravenous contrast. Angiographic images of the Circle of Willis were obtained using MRA technique without intravenous contrast. Angiographic images of the neck were obtained using MRA technique without and with intravenous contrast. Carotid stenosis measurements (when applicable) are obtained utilizing NASCET criteria, using the distal internal carotid diameter as the denominator. CONTRAST:  20mL MULTIHANCE GADOBENATE DIMEGLUMINE 529 MG/ML IV SOLN COMPARISON:  Prior CT from earlier same day. FINDINGS: MRI HEAD FINDINGS Study degraded by motion artifact. Diffuse prominence of the CSF containing spaces  compatible with generalized cerebral atrophy. Extensive patchy and confluent T2/FLAIR hyperintensity within the periventricular and deep white matter both cerebral hemispheres, consistent with chronic microvascular disease. Superimposed remote lacunar infarcts present within the bilateral basal ganglia as well as the periventricular and deep white matter both cerebral hemispheres. Remote lacunar infarct present within the pons and middle cerebellar peduncle is as well. Approximate 2 cm acute ischemic infarct seen involving the posterior limb of the left and sternal capsule extending into the posterior left corona radiata (series 3, image 27). Patchy extension into the left subinsular white matter (series 3, image 24). Additional 5 mm ischemic infarct at the posterior left mesial temporal lobe (series 3, image 21). Few additional punctate cortical and subcortical infarcts seen involving the right cerebral hemisphere (series 3, image 33, 16) as well as the superior left cerebral hemisphere (series 3, image 35). Probable additional acute/early subacute punctate left cerebellar infarct (series 7, image 14). No associated hemorrhage or mass effect. The no evidence for acute intracranial hemorrhage. Single subcentimeter chronic microhemorrhage noted at the right external capsule (series 10, image 16). No mass lesion, midline shift or mass effect. No hydrocephalus. No extra-axial fluid collection. Major dural sinuses patent. No abnormal enhancement. Pituitary gland normal. Right vertebral artery diminutive and not well visualized. Major intracranial vascular flow voids otherwise maintained. Craniocervical junction normal. Upper cervical spine within normal limits. Bone marrow signal intensity normal. No scalp soft tissue abnormality. Globes and orbital soft tissues within normal limits. Mild mucosal thickening within the ethmoidal air cells and maxillary sinuses. Superimposed small left maxillary sinus retention cyst  noted. Mastoids are clear. Inner ear structures normal. MRA HEAD FINDINGS ANTERIOR CIRCULATION: Study moderately degraded by motion artifact. Petrous, cavernous, and supraclinoid segments of the internal carotid arteries are patent without flow-limiting stenosis. Origin of the ophthalmic arteries patent bilaterally. ICA termini widely patent. A1 segments patent bilaterally. Anterior communicating artery not well seen on this motion degraded exam. There is an apparent focal severe stenosis of the mid-distal left A2 segment (series 6, image 116). Possible additional focal moderate stenosis involving the distal right A2 segment (series 6, image 133). M1 segments patent without stenosis or occlusion. MCA bifurcations normal. No obvious proximal M2 occlusion. Possible short-segment moderate to severe proximal left M2 stenosis noted (series 604, image 13). Distal MCA branches not well evaluated on this motion degraded exam, but are grossly symmetric and well perfused. POSTERIOR CIRCULATION: Left vertebral artery dominant and widely patent to the vertebrobasilar junction. Right vertebral artery diffusely hypoplastic and patent as well without obvious stenosis. Origin of the posterior inferior cerebral arteries not seen on this exam. Basilar artery mildly tortuous but widely patent to its distal aspect without stenosis. Superior cerebral arteries patent  proximally. Both of the posterior cerebral arteries primarily supplied via the basilar. PCAs widely patent proximally, not well evaluated distally due to motion. No aneurysm or vascular malformation. MRA NECK FINDINGS Source images reviewed. Visualized aortic arch of normal caliber with normal 3 vessel morphology. No flow-limiting stenosis about the origin of the great vessels. Visualized subclavian arteries widely patent. Right common and internal carotid artery is widely patent within the neck without high-grade or critical stenosis. No significant atheromatous narrowing  about the right carotid bifurcation. Left common and internal carotid artery's widely patent within the neck without high-grade or critical stenosis. Mild atheromatous irregularity about the left carotid bifurcation without flow-limiting stenosis. Both vertebral arteries arise from the subclavian arteries. Left vertebral artery dominant with a diffusely hypoplastic right vertebral artery. Short-segment moderate stenosis at the proximal right P1 segment (series 16109, image 10). Mild atheromatous irregularity within the distal right V2/V3 segments. Pre foraminal left V1 segment tortuous. No other high-grade or flow-limiting stenosis within the vertebral arteries bilaterally. IMPRESSION: MRI HEAD IMPRESSION: 1. Multifocal acute to early subacute ischemic infarcts involving the bilateral cerebral and left cerebellar hemispheres as above, most prominent of which is a 2 cm infarct at the posterior left lentiform nucleus/corona radiata. No associated hemorrhage or mass effect. Possible central thromboembolic etiology suspected given the various vascular distributions involved. 2. Generalized age-related cerebral atrophy with advanced chronic microvascular ischemic disease. MRA HEAD IMPRESSION: 1. Negative intracranial MRA. No large or proximal arterial branch occlusion. No high-grade or correctable stenosis. 2. Question short-segment moderate to severe bilateral A2 and proximal left M2 stenoses, somewhat limited in evaluation due to extensive motion artifact on this exam. MRA NECK IMPRESSION: 1. Widely patent carotid artery systems bilaterally without high-grade or critical flow limiting stenosis. 2. Short-segment moderate proximal right V1 stenosis. Vertebral arteries otherwise widely patent within the neck. Left vertebral artery is dominant with a diffusely hypoplastic right vertebral artery. Electronically Signed   By: Rise Mu M.D.   On: 10/17/2016 15:24   Mr Angiogram Neck W Or Wo Contrast  Result  Date: 10/17/2016 CLINICAL DATA:  Initial evaluation for acute right-sided weakness, right-sided facial droop. EXAM: MRI HEAD WITHOUT AND WITH CONTRAST MRA HEAD WITHOUT CONTRAST MRA NECK WITHOUT AND WITH CONTRAST TECHNIQUE: Multiplanar, multiecho pulse sequences of the brain and surrounding structures were obtained without intravenous contrast. Angiographic images of the Circle of Willis were obtained using MRA technique without intravenous contrast. Angiographic images of the neck were obtained using MRA technique without and with intravenous contrast. Carotid stenosis measurements (when applicable) are obtained utilizing NASCET criteria, using the distal internal carotid diameter as the denominator. CONTRAST:  20mL MULTIHANCE GADOBENATE DIMEGLUMINE 529 MG/ML IV SOLN COMPARISON:  Prior CT from earlier same day. FINDINGS: MRI HEAD FINDINGS Study degraded by motion artifact. Diffuse prominence of the CSF containing spaces compatible with generalized cerebral atrophy. Extensive patchy and confluent T2/FLAIR hyperintensity within the periventricular and deep white matter both cerebral hemispheres, consistent with chronic microvascular disease. Superimposed remote lacunar infarcts present within the bilateral basal ganglia as well as the periventricular and deep white matter both cerebral hemispheres. Remote lacunar infarct present within the pons and middle cerebellar peduncle is as well. Approximate 2 cm acute ischemic infarct seen involving the posterior limb of the left and sternal capsule extending into the posterior left corona radiata (series 3, image 27). Patchy extension into the left subinsular white matter (series 3, image 24). Additional 5 mm ischemic infarct at the posterior left mesial temporal lobe (series 3, image 21).  Few additional punctate cortical and subcortical infarcts seen involving the right cerebral hemisphere (series 3, image 33, 16) as well as the superior left cerebral hemisphere (series 3,  image 35). Probable additional acute/early subacute punctate left cerebellar infarct (series 7, image 14). No associated hemorrhage or mass effect. The no evidence for acute intracranial hemorrhage. Single subcentimeter chronic microhemorrhage noted at the right external capsule (series 10, image 16). No mass lesion, midline shift or mass effect. No hydrocephalus. No extra-axial fluid collection. Major dural sinuses patent. No abnormal enhancement. Pituitary gland normal. Right vertebral artery diminutive and not well visualized. Major intracranial vascular flow voids otherwise maintained. Craniocervical junction normal. Upper cervical spine within normal limits. Bone marrow signal intensity normal. No scalp soft tissue abnormality. Globes and orbital soft tissues within normal limits. Mild mucosal thickening within the ethmoidal air cells and maxillary sinuses. Superimposed small left maxillary sinus retention cyst noted. Mastoids are clear. Inner ear structures normal. MRA HEAD FINDINGS ANTERIOR CIRCULATION: Study moderately degraded by motion artifact. Petrous, cavernous, and supraclinoid segments of the internal carotid arteries are patent without flow-limiting stenosis. Origin of the ophthalmic arteries patent bilaterally. ICA termini widely patent. A1 segments patent bilaterally. Anterior communicating artery not well seen on this motion degraded exam. There is an apparent focal severe stenosis of the mid-distal left A2 segment (series 6, image 116). Possible additional focal moderate stenosis involving the distal right A2 segment (series 6, image 133). M1 segments patent without stenosis or occlusion. MCA bifurcations normal. No obvious proximal M2 occlusion. Possible short-segment moderate to severe proximal left M2 stenosis noted (series 604, image 13). Distal MCA branches not well evaluated on this motion degraded exam, but are grossly symmetric and well perfused. POSTERIOR CIRCULATION: Left vertebral  artery dominant and widely patent to the vertebrobasilar junction. Right vertebral artery diffusely hypoplastic and patent as well without obvious stenosis. Origin of the posterior inferior cerebral arteries not seen on this exam. Basilar artery mildly tortuous but widely patent to its distal aspect without stenosis. Superior cerebral arteries patent proximally. Both of the posterior cerebral arteries primarily supplied via the basilar. PCAs widely patent proximally, not well evaluated distally due to motion. No aneurysm or vascular malformation. MRA NECK FINDINGS Source images reviewed. Visualized aortic arch of normal caliber with normal 3 vessel morphology. No flow-limiting stenosis about the origin of the great vessels. Visualized subclavian arteries widely patent. Right common and internal carotid artery is widely patent within the neck without high-grade or critical stenosis. No significant atheromatous narrowing about the right carotid bifurcation. Left common and internal carotid artery's widely patent within the neck without high-grade or critical stenosis. Mild atheromatous irregularity about the left carotid bifurcation without flow-limiting stenosis. Both vertebral arteries arise from the subclavian arteries. Left vertebral artery dominant with a diffusely hypoplastic right vertebral artery. Short-segment moderate stenosis at the proximal right P1 segment (series 56387, image 10). Mild atheromatous irregularity within the distal right V2/V3 segments. Pre foraminal left V1 segment tortuous. No other high-grade or flow-limiting stenosis within the vertebral arteries bilaterally. IMPRESSION: MRI HEAD IMPRESSION: 1. Multifocal acute to early subacute ischemic infarcts involving the bilateral cerebral and left cerebellar hemispheres as above, most prominent of which is a 2 cm infarct at the posterior left lentiform nucleus/corona radiata. No associated hemorrhage or mass effect. Possible central  thromboembolic etiology suspected given the various vascular distributions involved. 2. Generalized age-related cerebral atrophy with advanced chronic microvascular ischemic disease. MRA HEAD IMPRESSION: 1. Negative intracranial MRA. No large or proximal arterial branch occlusion.  No high-grade or correctable stenosis. 2. Question short-segment moderate to severe bilateral A2 and proximal left M2 stenoses, somewhat limited in evaluation due to extensive motion artifact on this exam. MRA NECK IMPRESSION: 1. Widely patent carotid artery systems bilaterally without high-grade or critical flow limiting stenosis. 2. Short-segment moderate proximal right V1 stenosis. Vertebral arteries otherwise widely patent within the neck. Left vertebral artery is dominant with a diffusely hypoplastic right vertebral artery. Electronically Signed   By: Rise Mu M.D.   On: 10/17/2016 15:24   Mr Laqueta Jean RU Contrast  Result Date: 10/17/2016 CLINICAL DATA:  Initial evaluation for acute right-sided weakness, right-sided facial droop. EXAM: MRI HEAD WITHOUT AND WITH CONTRAST MRA HEAD WITHOUT CONTRAST MRA NECK WITHOUT AND WITH CONTRAST TECHNIQUE: Multiplanar, multiecho pulse sequences of the brain and surrounding structures were obtained without intravenous contrast. Angiographic images of the Circle of Willis were obtained using MRA technique without intravenous contrast. Angiographic images of the neck were obtained using MRA technique without and with intravenous contrast. Carotid stenosis measurements (when applicable) are obtained utilizing NASCET criteria, using the distal internal carotid diameter as the denominator. CONTRAST:  20mL MULTIHANCE GADOBENATE DIMEGLUMINE 529 MG/ML IV SOLN COMPARISON:  Prior CT from earlier same day. FINDINGS: MRI HEAD FINDINGS Study degraded by motion artifact. Diffuse prominence of the CSF containing spaces compatible with generalized cerebral atrophy. Extensive patchy and confluent  T2/FLAIR hyperintensity within the periventricular and deep white matter both cerebral hemispheres, consistent with chronic microvascular disease. Superimposed remote lacunar infarcts present within the bilateral basal ganglia as well as the periventricular and deep white matter both cerebral hemispheres. Remote lacunar infarct present within the pons and middle cerebellar peduncle is as well. Approximate 2 cm acute ischemic infarct seen involving the posterior limb of the left and sternal capsule extending into the posterior left corona radiata (series 3, image 27). Patchy extension into the left subinsular white matter (series 3, image 24). Additional 5 mm ischemic infarct at the posterior left mesial temporal lobe (series 3, image 21). Few additional punctate cortical and subcortical infarcts seen involving the right cerebral hemisphere (series 3, image 33, 16) as well as the superior left cerebral hemisphere (series 3, image 35). Probable additional acute/early subacute punctate left cerebellar infarct (series 7, image 14). No associated hemorrhage or mass effect. The no evidence for acute intracranial hemorrhage. Single subcentimeter chronic microhemorrhage noted at the right external capsule (series 10, image 16). No mass lesion, midline shift or mass effect. No hydrocephalus. No extra-axial fluid collection. Major dural sinuses patent. No abnormal enhancement. Pituitary gland normal. Right vertebral artery diminutive and not well visualized. Major intracranial vascular flow voids otherwise maintained. Craniocervical junction normal. Upper cervical spine within normal limits. Bone marrow signal intensity normal. No scalp soft tissue abnormality. Globes and orbital soft tissues within normal limits. Mild mucosal thickening within the ethmoidal air cells and maxillary sinuses. Superimposed small left maxillary sinus retention cyst noted. Mastoids are clear. Inner ear structures normal. MRA HEAD FINDINGS ANTERIOR  CIRCULATION: Study moderately degraded by motion artifact. Petrous, cavernous, and supraclinoid segments of the internal carotid arteries are patent without flow-limiting stenosis. Origin of the ophthalmic arteries patent bilaterally. ICA termini widely patent. A1 segments patent bilaterally. Anterior communicating artery not well seen on this motion degraded exam. There is an apparent focal severe stenosis of the mid-distal left A2 segment (series 6, image 116). Possible additional focal moderate stenosis involving the distal right A2 segment (series 6, image 133). M1 segments patent without stenosis or occlusion. MCA  bifurcations normal. No obvious proximal M2 occlusion. Possible short-segment moderate to severe proximal left M2 stenosis noted (series 604, image 13). Distal MCA branches not well evaluated on this motion degraded exam, but are grossly symmetric and well perfused. POSTERIOR CIRCULATION: Left vertebral artery dominant and widely patent to the vertebrobasilar junction. Right vertebral artery diffusely hypoplastic and patent as well without obvious stenosis. Origin of the posterior inferior cerebral arteries not seen on this exam. Basilar artery mildly tortuous but widely patent to its distal aspect without stenosis. Superior cerebral arteries patent proximally. Both of the posterior cerebral arteries primarily supplied via the basilar. PCAs widely patent proximally, not well evaluated distally due to motion. No aneurysm or vascular malformation. MRA NECK FINDINGS Source images reviewed. Visualized aortic arch of normal caliber with normal 3 vessel morphology. No flow-limiting stenosis about the origin of the great vessels. Visualized subclavian arteries widely patent. Right common and internal carotid artery is widely patent within the neck without high-grade or critical stenosis. No significant atheromatous narrowing about the right carotid bifurcation. Left common and internal carotid artery's  widely patent within the neck without high-grade or critical stenosis. Mild atheromatous irregularity about the left carotid bifurcation without flow-limiting stenosis. Both vertebral arteries arise from the subclavian arteries. Left vertebral artery dominant with a diffusely hypoplastic right vertebral artery. Short-segment moderate stenosis at the proximal right P1 segment (series 16109, image 10). Mild atheromatous irregularity within the distal right V2/V3 segments. Pre foraminal left V1 segment tortuous. No other high-grade or flow-limiting stenosis within the vertebral arteries bilaterally. IMPRESSION: MRI HEAD IMPRESSION: 1. Multifocal acute to early subacute ischemic infarcts involving the bilateral cerebral and left cerebellar hemispheres as above, most prominent of which is a 2 cm infarct at the posterior left lentiform nucleus/corona radiata. No associated hemorrhage or mass effect. Possible central thromboembolic etiology suspected given the various vascular distributions involved. 2. Generalized age-related cerebral atrophy with advanced chronic microvascular ischemic disease. MRA HEAD IMPRESSION: 1. Negative intracranial MRA. No large or proximal arterial branch occlusion. No high-grade or correctable stenosis. 2. Question short-segment moderate to severe bilateral A2 and proximal left M2 stenoses, somewhat limited in evaluation due to extensive motion artifact on this exam. MRA NECK IMPRESSION: 1. Widely patent carotid artery systems bilaterally without high-grade or critical flow limiting stenosis. 2. Short-segment moderate proximal right V1 stenosis. Vertebral arteries otherwise widely patent within the neck. Left vertebral artery is dominant with a diffusely hypoplastic right vertebral artery. Electronically Signed   By: Rise Mu M.D.   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 Does the need for close, 24 hr/day medical supervision in concert with the patient's rehab needs make it unreasonable for this patient to be served in a less intensive setting? Yes 2. Co-Morbidities requiring supervision/potential complications: Gout, depression, hypertension, history of ethanol abuse 3. Due to bladder management, bowel management, safety, skin/wound care, disease management, medication administration, pain management and patient education, does the patient require 24 hr/day rehab nursing? Yes 4. Does the patient require coordinated care of a physician, rehab nurse, PT (1-2 hrs/day, 5 days/week), OT (1-2 hrs/day, 5 days/week) and SLP (.5-1 hrs/day, 5 days/week) to address physical and functional deficits in the context of the above medical diagnosis(es)? Yes Addressing deficits in the following areas: balance, endurance, locomotion, strength, transferring, bowel/bladder control, bathing, dressing, feeding, grooming, toileting, cognition, speech, language and psychosocial support 5. Can the patient actively participate in  an intensive therapy program of at least 3 hrs of therapy per day at least 5 days per week? Yes 6. The potential for patient to make measurable gains while on inpatient rehab is excellent 7. Anticipated functional outcomes upon discharge from inpatient rehab are supervision and min assist  with PT, supervision and min assist with OT, supervision with SLP. 8. Estimated rehab length of stay to reach the above functional goals is: 18-21d 9. Anticipated D/C setting: Home 10. Anticipated post D/C treatments: HH therapy 11. Overall Rehab/Functional Prognosis: good  RECOMMENDATIONS: This patient's condition is appropriate for continued rehabilitative care in the following setting: CIR Patient has agreed to participate in recommended program. Yes Note that insurance prior authorization may be required for reimbursement for recommended care.  Comment:     Erick Colace M.D. [facility] FAAPM&R (Sports Med, Neuromuscular Med) Diplomate Am Board of Electrodiagnostic Med  Jerene Pitch 10/18/2016

## 2016-10-18 NOTE — Evaluation (Signed)
Occupational Therapy Evaluation Patient Details Name: Brandon [name] MRN: [id number] DOB: Jun 27, 1955 Today's Date: 10/18/2016    History of Present Illness 61 year old male with a history of depression, alcohol abuse, chronic kidney disease, morbid obesity, found on the floor by his father. Imaging revealed Multifocal acute to early subacute ischemic infarcts involving he bilateral cerebral and left cerebellar hemispheres.   Clinical Impression   PTA, pt was living with his father and was independent. Pt currently requiring Max-Total A for bed mobility. At EOB, pt with elevated BP at 185/110; returned pt to bed level and increased supported sitting for self feeding in bed. Pt currently present with R sided weakness and decreased cognition. Pt would benefit from acute OT to facilitate safe dc. Recommend dc to CIR for further intensive OT to optimize independence and safety in ADLs and functional mobility to achieve Mod I level before returning home.     Follow Up Recommendations  CIR;Supervision/Assistance - 24 hour    Equipment Recommendations  Other (comment) (Defer to next venue)    Recommendations for Other Services PT consult;Rehab consult     Precautions / Restrictions Precautions Precautions: Fall Restrictions Weight Bearing Restrictions: No      Mobility Bed Mobility Overal bed mobility: Needs Assistance Bed Mobility: Rolling;Sidelying to Sit;Sit to Supine Rolling: Max assist;+2 for physical assistance Sidelying to sit: Max assist;+2 for physical assistance;HOB elevated   Sit to supine: Total assist;+2 for physical assistance   General bed mobility comments: Patient able to use LUE and LLE to initiate some movement to EOB, manual assist to place hand on rail. Increased physical assist of 2 persons to roll, elevate trunk to upright and rotate hips to EOB using chuck pads.   Transfers                 General transfer comment: defer due to elevate BP in  sitting    Balance Overall balance assessment: Needs assistance Sitting-balance support: Single extremity supported;Feet supported Sitting balance-Leahy Scale: Poor Sitting balance - Comments: patient required moderate to max assist to maintain sitting balance at EOB, noted right sided lean with significant posterior bias Postural control: Posterior lean;Right lateral lean                                 ADL either performed or assessed with clinical judgement   ADL Overall ADL's : Needs assistance/impaired Eating/Feeding: Set up;Sitting Eating/Feeding Details (indicate cue type and reason): Using L hand, pt able to self feeding and open can. Provided assistance to support pt sitting with pillows and elevated HOB.  Grooming: Set up;Bed level   Upper Body Bathing: Maximal assistance;Sitting   Lower Body Bathing: Total assistance;Bed level;+2 for physical assistance   Upper Body Dressing : Maximal assistance;Sitting   Lower Body Dressing: Total assistance;+2 for physical assistance;Bed level   Toilet Transfer: Total assistance             General ADL Comments: Limited evaluation due to BP 185/110 while sitting at EOB. Pt with decreased functional performance due to no movement of R (dominant hand) side.  Pt requiring Max-Total A for dressing, bathing, and toileting. Pt able to perform self feeding and grooming at bed level with supported seated and HOB elevated.     Vision   Vision Assessment?: Vision impaired- to be further tested in functional context;Yes Tracking/Visual Pursuits: Decreased smoothness of horizontal tracking;Decreased smoothness of vertical tracking Additional Comments: Pt with decreased  smooth pursuits and will need to further assess.     Perception     Praxis      Pertinent Vitals/Pain Pain Assessment: Faces Faces Pain Scale: Hurts even more Pain Location: RLE distal (foot/ankle region with touch) Pain Descriptors / Indicators:  Grimacing;Moaning Pain Intervention(s): Monitored during session;Limited activity within patient's tolerance;Repositioned     Hand Dominance Right   Extremity/Trunk Assessment Upper Extremity Assessment Upper Extremity Assessment: RUE deficits/detail RUE Deficits / Details: Brunstrom level 1 with no active movement of hand, wrist, elbow, or shoulder. PROM WFL. Pt reports some deffierences in sensation on RUE RUE Sensation: decreased light touch RUE Coordination: decreased fine motor;decreased gross motor   Lower Extremity Assessment Lower Extremity Assessment: Defer to PT evaluation   Cervical / Trunk Assessment Cervical / Trunk Assessment:  (alterred trunk positioning due to weakness)   Communication Communication Communication:  (modestly dysarthric)   Cognition Arousal/Alertness: Awake/alert Behavior During Therapy: WFL for tasks assessed/performed Overall Cognitive Status: Impaired/Different from baseline Area of Impairment: Attention;Following commands;Problem solving;Safety/judgement;Awareness                   Current Attention Level: Sustained   Following Commands: Follows one step commands with increased time Safety/Judgement: Decreased awareness of safety;Decreased awareness of deficits Awareness: Emergent Problem Solving: Slow processing;Difficulty sequencing;Requires verbal cues;Requires tactile cues General Comments: Pt requiring increased cues and time to follow simple commands in a quiet environement.    General Comments       Exercises     Shoulder Instructions      Home Living Family/patient expects to be discharged to:: Private residence Living Arrangements: Parent Available Help at Discharge: Family Type of Home: House Home Access: Stairs to enter Secretary/administrator of Steps: 6   Home Layout: Two level;Bed/bath upstairs Alternate Level Stairs-Number of Steps: 12 Alternate Level Stairs-Rails: Can reach both Bathroom Shower/Tub:  Chief Strategy Officer: Standard            Lives With: Family    Prior Functioning/Environment Level of Independence: Independent        Comments: Not working - recently lost his job. Worked as a Insurance claims handler Problem List: Decreased strength;Decreased range of motion;Decreased activity tolerance;Impaired balance (sitting and/or standing);Decreased cognition;Decreased safety awareness;Decreased knowledge of use of DME or AE;Decreased coordination;Impaired UE functional use;Pain;Impaired vision/perception      OT Treatment/Interventions: Self-care/ADL training;Therapeutic exercise;Energy conservation;DME and/or AE instruction;Therapeutic activities;Patient/family education    OT Goals(Current goals can be found in the care plan section) Acute Rehab OT Goals Patient Stated Goal: to get back to prior function OT Goal Formulation: With patient Time For Goal Achievement: 11/01/16 Potential to Achieve Goals: Good ADL Goals Pt Will Perform Grooming: with set-up;with supervision;sitting (Using RUE as a dependent stabilizer) Pt Will Perform Upper Body Dressing: with set-up;with supervision;sitting Pt Will Transfer to Toilet: with set-up;bedside commode;stand pivot transfer;with min assist;with +2 assist Additional ADL Goal #1: Pt will perform bed mobility with Min A +2 in preparation for ADLs.  OT Frequency: Min 3X/week   Barriers to D/C: Decreased caregiver support  Lives with his elderly father       Co-evaluation PT/OT/SLP Co-Evaluation/Treatment: Yes Reason for Co-Treatment: Complexity of the patient's impairments (multi-system involvement) PT goals addressed during session: Mobility/safety with mobility OT goals addressed during session: ADL's and self-care      AM-PAC PT "6 Clicks" Daily Activity     Outcome Measure Help from another person eating meals?: A Little Help  from another person taking care of personal grooming?: A Little Help from another  person toileting, which includes using toliet, bedpan, or urinal?: Total Help from another person bathing (including washing, rinsing, drying)?: A Lot Help from another person to put on and taking off regular upper body clothing?: A Lot Help from another person to put on and taking off regular lower body clothing?: Total 6 Click Score: 12   End of Session Nurse Communication: Mobility status;Other (comment) (Elevated BP with movement)  Activity Tolerance: Patient tolerated treatment well (Limited by elevated BP) Patient left: in bed;with call bell/phone within reach;with bed alarm set  OT Visit Diagnosis: Unsteadiness on feet (R26.81);Other abnormalities of gait and mobility (R26.89);Muscle weakness (generalized) (M62.81);Hemiplegia and hemiparesis;Other symptoms and signs involving cognitive function;Pain Hemiplegia - Right/Left: Right Hemiplegia - dominant/non-dominant: Dominant Hemiplegia - caused by: Cerebral infarction Pain - Right/Left: Right Pain - part of body: Leg                Time: 1011-1036 OT Time Calculation (min): 25 min Charges:  OT General Charges $OT Visit: 1 Visit OT Evaluation $OT Eval Moderate Complexity: 1 Mod G-Codes:     Rebel Willcutt MSOT, OTR/L Acute Rehab Pager: [phone] Office: [phone]  Theodoro Grist Ruweyda Macknight 10/18/2016, 4:34 PM

## 2016-10-18 NOTE — Evaluation (Signed)
Physical Therapy Evaluation Patient Details Name: Brandon [name] MRN: [id number] DOB: 1955/05/08 Today's Date: 10/18/2016   History of Present Illness  61 year old male with a history of depression, alcohol abuse, chronic kidney disease, morbid obesity, found on the floor by his father. Imaging revealed Multifocal acute to early subacute ischemic infarcts involving he bilateral cerebral and left cerebellar hemispheres.  Clinical Impression  Orders received for PT evaluation. Patient demonstrates deficits in functional mobility as indicated below. Will benefit from continued skilled PT to address deficits and maximize function. Will see as indicated and progress as tolerated.  Prior to admission, patient was very independent. At this time, patient requires increased physical assist for all aspects of mobility as well as some cognitive deficits noted during session. Anticipate patient will require increased comprehensive therapies to address deficits and maximize functional recovery.  Will recommend CIR consult at this time.   OF NOTE: Evaluation limited to EOB activity secondary to elevated BP in supine as well as sitting above parameters 185/110. Nsg aware.    Follow Up Recommendations CIR    Equipment Recommendations   (tbd)    Recommendations for Other Services Rehab consult     Precautions / Restrictions Precautions Precautions: Fall Restrictions Weight Bearing Restrictions: No      Mobility  Bed Mobility Overal bed mobility: Needs Assistance Bed Mobility: Rolling;Sidelying to Sit;Sit to Supine Rolling: Max assist;+2 for physical assistance Sidelying to sit: Max assist;+2 for physical assistance;HOB elevated   Sit to supine: Total assist;+2 for physical assistance   General bed mobility comments: Patient able to use LUE and LLE to initiate some movement to EOB, manual assist to place hand on rail. Increased physical assist of 2 persons to roll, elevate trunk to upright and  rotate hips to EOB using chuck pads.   Transfers                 General transfer comment: defer due to elevate BP in sitting  Ambulation/Gait                Stairs            Wheelchair Mobility    Modified Rankin (Stroke Patients Only) Modified Rankin (Stroke Patients Only) Pre-Morbid Rankin Score: No symptoms Modified Rankin: Severe disability     Balance Overall balance assessment: Needs assistance Sitting-balance support: Single extremity supported;Feet supported Sitting balance-Leahy Scale: Poor Sitting balance - Comments: patient required moderate to max assist to maintain sitting balance at EOB, noted right sided lean with significant posterior bias Postural control: Posterior lean;Right lateral lean                                   Pertinent Vitals/Pain Pain Assessment: Faces Faces Pain Scale: Hurts even more Pain Location: RLE distal (foot/ankle region with touch) Pain Descriptors / Indicators: Grimacing;Moaning Pain Intervention(s): Monitored during session    Home Living Family/patient expects to be discharged to:: Private residence Living Arrangements: Parent Available Help at Discharge: Family Type of Home: House Home Access: Stairs to enter   Secretary/administrator of Steps: 6 Home Layout: Two level;Bed/bath upstairs        Prior Function Level of Independence: Independent               Hand Dominance   Dominant Hand: Right    Extremity/Trunk Assessment   Upper Extremity Assessment Upper Extremity Assessment: Defer to OT evaluation;RUE deficits/detail    Lower  Extremity Assessment Lower Extremity Assessment: RLE deficits/detail RLE Deficits / Details: no active ROM noted at this time, diminished distal sensation RLE Sensation: decreased light touch RLE Coordination: decreased fine motor;decreased gross motor    Cervical / Trunk Assessment Cervical / Trunk Assessment:  (alterred trunk positioning  due to weakness)  Communication   Communication:  (modestly dysarthric)  Cognition Arousal/Alertness: Awake/alert Behavior During Therapy: WFL for tasks assessed/performed Overall Cognitive Status: Impaired/Different from baseline Area of Impairment: Attention;Following commands;Problem solving;Safety/judgement;Awareness;Memory                       Following Commands: Follows one step commands with increased time   Awareness: Emergent Problem Solving: Slow processing;Difficulty sequencing;Requires verbal cues;Requires tactile cues        General Comments      Exercises     Assessment/Plan    PT Assessment Patient needs continued PT services  PT Problem List Decreased strength;Decreased activity tolerance;Decreased balance;Decreased mobility;Decreased coordination;Decreased cognition;Decreased safety awareness;Pain       PT Treatment Interventions DME instruction;Gait training;Stair training;Functional mobility training;Therapeutic activities;Therapeutic exercise;Balance training;Patient/family education    PT Goals (Current goals can be found in the Care Plan section)  Acute Rehab PT Goals Patient Stated Goal: to get back to prior function PT Goal Formulation: With patient Time For Goal Achievement: 11/01/16 Potential to Achieve Goals: Good    Frequency Min 4X/week   Barriers to discharge Decreased caregiver support      Co-evaluation PT/OT/SLP Co-Evaluation/Treatment: Yes Reason for Co-Treatment: Complexity of the patient's impairments (multi-system involvement);Necessary to address cognition/behavior during functional activity;For patient/therapist safety;To address functional/ADL transfers PT goals addressed during session: Mobility/safety with mobility         AM-PAC PT "6 Clicks" Daily Activity  Outcome Measure Difficulty turning over in bed (including adjusting bedclothes, sheets and blankets)?: Unable Difficulty moving from lying on back to  sitting on the side of the bed? : Unable Difficulty sitting down on and standing up from a chair with arms (e.g., wheelchair, bedside commode, etc,.)?: Unable Help needed moving to and from a bed to chair (including a wheelchair)?: Total Help needed walking in hospital room?: Total Help needed climbing 3-5 steps with a railing? : Total 6 Click Score: 6    End of Session   Activity Tolerance: Treatment limited secondary to medical complications (Comment) (elevated BP above MD parameters) Patient left: in bed;with call bell/phone within reach;with chair alarm set Nurse Communication: Mobility status PT Visit Diagnosis: Hemiplegia and hemiparesis Hemiplegia - Right/Left: Right Hemiplegia - dominant/non-dominant: Dominant Hemiplegia - caused by: Cerebral infarction    Time: 1011-1036 PT Time Calculation (min) (ACUTE ONLY): 25 min   Charges:   PT Evaluation $PT Eval Moderate Complexity: 1 Mod     PT G Codes:        Charlotte Crumb, PT DPT  Board Certified Neurologic Specialist [phone]   Fabio Asa 10/18/2016, 10:51 AM

## 2016-10-18 NOTE — Progress Notes (Signed)
STROKE TEAM PROGRESS NOTE   HISTORY OF PRESENT ILLNESS (per record) Brandon [name] is a 61 y.o. male  was a past medical history of hypertension, hyperlipidemia, depression, alcohol abuse, chronic kidney disease and obesity, who presented via EMS to [facility] with right-sided weakness.  He was found on the floor by his family. EMS was called and he was brought to [facility].  Patient was noted to have right-sided weakness.  He said he was normal up until sometime on Monday, 10/15/2016 when he started noticing some weakness on the right side of his body, and gradually was unable to move the right side at all. Does not report any headaches. Does not report blurry vision. Does not report word finding difficulty.  He was hypertensive on arrival at the emergency room. Also had elevated troponin.  Noncontrast CT of the head was done that did not show any evidence of acute stroke.  MRI of the brain was done, that showed multiple acute/subacute ischemic infarcts in bilateral cerebral hemispheres and left cerebellar hemisphere.  LKW: At least 48 hours prior to presentation Premorbid modified Rankin scale (mRS): 0-Completely asymptomatic and back to baseline post-stroke  Patient was not administered IV t-PA secondary to arriving outside of the tPA treatment window. He was admitted to General Neurology for further evaluation and treatment.   SUBJECTIVE (INTERVAL HISTORY) No family is at the bedside.  The pt is awake, alert, and follows all commands appropriately.  TEE with Dr. Jacinto Halim tomorrow.     OBJECTIVE Temp:  [97.7 F (36.5 C)-98.4 F (36.9 C)] 97.7 F (36.5 C) (09/20 1333) Pulse Rate:  [68-103] 95 (09/20 1333) Cardiac Rhythm: Normal sinus rhythm (09/20 0700) Resp:  [12-25] 20 (09/20 1333) BP: (134-210)/(102-148) 177/109 (09/20 1333) SpO2:  [95 %-100 %] 98 % (09/20 1333) Weight:  [115.5 kg (254 lb 10.1 oz)] 115.5 kg (254 lb 10.1 oz) (09/19 2132)  CBC:   Recent Labs Lab 10/17/16 0850  10/18/16 0733  WBC 12.3* 9.3  NEUTROABS 8.1*  --   HGB 17.8* 15.9  HCT 48.4 45.2  MCV 94.2 96.2  PLT 257 219    Basic Metabolic Panel:   Recent Labs Lab 10/17/16 0850 10/18/16 0733  NA 142 143  K 3.4* 3.5  CL 105 111  CO2 27 23  GLUCOSE 101* 97  BUN 48* 33*  CREATININE 1.80* 1.56*  CALCIUM 9.2 8.7*    Lipid Panel: No results found for: CHOL, TRIG, HDL, CHOLHDL, VLDL, LDLCALC HgbA1c: No results found for: HGBA1C Urine Drug Screen:     Component Value Date/Time   LABOPIA NONE DETECTED 10/17/2016 0836   COCAINSCRNUR NONE DETECTED 10/17/2016 0836   LABBENZ NONE DETECTED 10/17/2016 0836   AMPHETMU NONE DETECTED 10/17/2016 0836   THCU NONE DETECTED 10/17/2016 0836   LABBARB NONE DETECTED 10/17/2016 0836    Alcohol Level     Component Value Date/Time   ETH <5 10/17/2016 0850    IMAGING  Dg Chest 2 View 10/17/2016 IMPRESSION: No active cardiopulmonary disease.  Ct Head Wo Contrast 10/17/2016 IMPRESSION: 1. No acute hemorrhage or definite acute infarct. 2. Advanced chronic small vessel ischemia which could easily obscure an acute white matter or deep gray nucleus infarct. 3. Small remote appearing right frontal cortex infarct.   Mr Laqueta Jean Wo Contrast 10/17/2016 MRI HEAD IMPRESSION: 1. Multifocal acute to early subacute ischemic infarcts involving the bilateral cerebral and left cerebellar hemispheres as above, most prominent of which is a 2 cm infarct at the posterior left  lentiform nucleus/corona radiata. No associated hemorrhage or mass effect. Possible central thromboembolic etiology suspected given the various vascular distributions involved. 2. Generalized age-related cerebral atrophy with advanced chronic microvascular ischemic disease.   Mr Maxine Glenn Head Wo Contrast 10/17/2016 MRA HEAD IMPRESSION: 1. Negative intracranial MRA. No large or proximal arterial branch occlusion. No high-grade or correctable stenosis. 2. Question short-segment moderate to severe bilateral  A2 and proximal left M2 stenoses, somewhat limited in evaluation due to extensive motion artifact on this exam.   Mr Angiogram Neck W Or Wo Contrast 10/17/2016 MRA NECK IMPRESSION: 1. Widely patent carotid artery systems bilaterally without high-grade or critical flow limiting stenosis. 2. Short-segment moderate proximal right V1 stenosis. Vertebral arteries otherwise widely patent within the neck. Left vertebral artery is dominant with a diffusely hypoplastic right vertebral artery.   Carotid US 10/18/2016 Preliminary findings: Bilateral 1-39% ICA stenosis, antegrade vertebral flow.  TTE 10/18/2016  pending  TEE (Dr. Jacinto Halim) 10/19/2016  pending    PHYSICAL EXAM Pleasant middle aged male not in distress. . Afebrile. Head is nontraumatic. Neck is supple without bruit.    Cardiac exam no murmur or gallop. Lungs are clear to auscultation. Distal pulses are well felt. Neurological Exam :  Awake alert oriented 3. Mild dysarthria. Follows commands well. Extraocular moments are full range without nystagmus. Face is asymmetric with right lower facial weakness. Tongue is midline. Motor system exam right hemiplegia with 1/5 right-sided strength. Good normal left-sided strength. Slight diminished sensation on the right. Coordination cannot be tested on the right normal on the left. Gait not tested. ASSESSMENT/PLAN Brandon [name] is a 61 y.o. male with history of  hypertension, hyperlipidemia, depression, alcohol abuse, chronic kidney disease and obesity, who presented via EMS to [facility] with right-sided weakness. He did not receive IV t-PA due to arriving outside of the tPA treatment window.   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  TEE 10/19/2016.  Resultant  right hemiplegia and facial  weakness  CT head: no acute stroke.  Advanced small vessel disease.  MRI head: Multifocal acute/early subacute bilateral cerebral and left cerebellar ischemic infarcts  MRA head: no LVO or high-grade stenosis  Carotid Doppler: Preliminary findings: Bilateral 1-39% ICA stenosis, antegrade vertebral flow.  2D Echo:  pending  TEE: pending   LDL  pending  HgbA1c pending    Heparin 5,000 units [place] Q8H for VTE prophylaxis Diet Heart Room service appropriate? Yes; Fluid consistency: Thin Diet NPO time specified  No antithrombotic prior to admission, now on aspirin 325 mg daily  Patient counseled to be compliant with his antithrombotic medications  Ongoing aggressive stroke risk factor management  Therapy recommendations: CIR  Disposition: pending  Hypertension  Stable  Permissive hypertension (OK if < 220/120) but gradually normalize in 5-7 days  Long-term BP goal normotensive  Hyperlipidemia  Home meds:  none  LDL  pending, goal < 70  Add atorvastatin 40 mg PO daily  Continue statin at discharge  Other Stroke Risk Factors  ETOH use, advised to drink no more than 2 drink(s) a day  Obesity, Body mass index is 35.51 kg/m., recommend weight loss, diet and exercise as appropriate   Other Active Problems  CKD, Stage 3a: Scr 1.56 (chronic baseline), GFR 47  Downward deflecting ST segment on EKG: troponin negative x 3, CK pending.  TTE and TEE pending.  Likely chronic CAD.  Hospital day # 1  I have personally examined this patient, reviewed notes, independently viewed imaging studies, participated in medical decision making and plan of care.ROS completed by me personally and pertinent positives fully documented  I have made any additions or clarifications directly to the above note. He presented with right hemiplegia secondary to large left brain subcortical infarct but MRI shows bilateral anterior and posterior circulation infarcts raising possibility of  cardioembolic source. Recommend continue ongoing stroke workup and check transesophageal echocardiogram and loop recorder for long-term monitoring for paroxysmal A. fib. Continue aspirin.Long discussion with patient. Discussed with Dr. Catha Gosselin and Dr.Patwardhan. Greater than 50% time during this 35 minute visit was spent on counseling and coordination of care about stroke evaluation, prevention and treatment Delia Heady, MD Medical Director [facility] Pager: [phone] 10/18/2016 3:41 PM   To contact Stroke Continuity provider, please refer to [email or web address]. After hours, contact General Neurology

## 2016-10-18 NOTE — Progress Notes (Signed)
  Echocardiogram 2D Echocardiogram has been performed.  Brandon [name] 10/18/2016, 9:32 AM

## 2016-10-18 NOTE — Evaluation (Signed)
Speech Language Pathology Evaluation Patient Details Name: Brandon [name] MRN: [id number] DOB: [birth year] Today's Date: 10/18/2016 Time: 0143-0204 SLP Time Calculation (min) (ACUTE ONLY): 21 min  Problem List:  Patient Active Problem List   Diagnosis Date Noted  . CVA (cerebral vascular accident) (HCC) 10/17/2016  . Depression 05/20/2011  . ETOH abuse 05/20/2011  . Renal insufficiency 05/20/2011  . Obesity 05/20/2011  . HYPERLIPIDEMIA 04/22/2008  . HYPERTENSION 04/22/2008  . PLEURAL EFFUSION, LEFT 04/22/2008   Past Medical History:  Past Medical History:  Diagnosis Date  . Depression   . Hx of pleural effusion   . Hyperlipidemia   . Hypertension    Past Surgical History:  Past Surgical History:  Procedure Laterality Date  . THORACENTESIS     HPI:  Pt is a 61 y.o.malewas a past medical history of hypertension, hyperlipidemia, depression, alcohol abuse, chronic kidney disease and obesity, who was found on the floor by his family. Patient was noted to have right-sided weakness and some dysarthria. MRI of the brain was done, that showed multiple acute/subacute ischemic infarcts in bilateral cerebral hemispheres and left cerebellar hemisphere.    Assessment / Plan / Recommendation Clinical Impression  Pt demonstrates mildly dysarthric speech with cognitive deficits in the area of working memory and recall of  information. Receptive and expressive language within normal limits. SLE limited due to time contraint. Naming, attention, orientation and expressive/receptive language assessed; all within functional limits. Recommend further diagnostic treatment focusing on visuospatial and reasoning tasks. F/u therapy recommended to facilitate compensatory strategies for memory as well as improve verbal intelligibility. Informed the pt about f/u SLP tx options. Will continue to follow for functional communication and further diagnostic treatment.     SLP Assessment  SLP  Recommendation/Assessment: Patient needs continued Speech Lanaguage Pathology Services SLP Visit Diagnosis: Dysarthria and anarthria (R47.1)    Follow Up Recommendations  Inpatient Rehab;Outpatient SLP    Frequency and Duration min 2x/week  2 weeks      SLP Evaluation Cognition  Overall Cognitive Status: Impaired/Different from baseline Arousal/Alertness: Awake/alert Orientation Level: Oriented to person;Oriented to place;Oriented to situation;Disoriented to time Memory: Impaired Memory Impairment: Storage deficit;Retrieval deficit Decreased Long Term Memory: Functional basic Behaviors: Verbal agitation       Comprehension  Auditory Comprehension Overall Auditory Comprehension: Appears within functional limits for tasks assessed    Expression Expression Primary Mode of Expression: Verbal Verbal Expression Overall Verbal Expression: Appears within functional limits for tasks assessed Repetition: No impairment Naming: No impairment Written Expression Dominant Hand: Right   Oral / Motor  Oral Motor/Sensory Function Overall Oral Motor/Sensory Function: Mild impairment Facial Symmetry: Abnormal symmetry right Lingual Strength: Reduced Motor Speech Overall Motor Speech: Impaired Respiration: Within functional limits Phonation: Normal Resonance: Within functional limits Articulation: Impaired Level of Impairment: Word Intelligibility: Intelligibility reduced Word: 50-74% accurate Phrase: 50-74% accurate Sentence: 50-74% accurate Motor Planning: Witnin functional limits Motor Speech Errors: Aware;Consistent   GO                    Carmela Rima, Student SLP 10/18/2016, 2:40 PM

## 2016-10-18 NOTE — Progress Notes (Signed)
PROGRESS NOTE    Esli Jernigan Laine  VHQ:[id number] DOB: [birth year] DOA: 10/17/2016 PCP: Patient, No Pcp Per   Chief Complaint  Patient presents with  . Cerebrovascular Accident    Brief Narrative:  HPI on 10/17/2016 by Dr. Richarda Overlie 61 year old male with a history of depression, alcohol abuse, chronic kidney disease, morbid obesity, found on the floor by his father, EMS was called. Patient noted to have right-sided weakness including right upper right lower extremity weakness, according to the patient onset 48 hours ago prior to presentation. Patient also had a headache but no nausea vomiting. Denied any blurry vision, slurred speech, difficulty swallowing Assessment & Plan   Acute CVA -CTA showed no acute hemorrhage or definite acute infarct -MRI head showed multifocal acute to early subacute ischemic infarcts involving bilateral cerebral and left cerebellar hemispheres -MRA head unremarkable -MRA neck widely patent carotid artery systems bilaterally without high-grade critical flow limiting stenosis. -Pending lipid panel and hemoglobin A1c -Pending echocardiogram -Carotid Doppler shows 139% ICA stenosis, antegrade vertebral flow -Continue aspirin -Neurology consulted and appreciated -PT recommended CIR  Elevated troponin -likely secondary to the above -currently chest pain free -continue to cycle troponin -heparin drip held given CVA -Cardiology consulted and appreciated: Recommended aspirin and Plavix, hold heparin until the size of infarct is noted. Risk of hemorrhage with anticoagulation in setting of large infarct. Obtain stress test before discharge. Check lipid panel and echocardiogram.  Leukocytosis -resolved, likely secondary to the above -no complaints of shortnes of breath/cough, or urinary issues -CXR and UA unremarkable for infection -Currently afebrile   Hypokalemia -resolved, continue to monitor BMP  Chronic kidney disease, stage III  -Creatinine appears to  be stable, continue to monitor BMP  DVT Prophylaxis  Heparin SQ   Code Status: Full  Family Communication: none at bedside  Disposition Plan: Admitted   Consultants Neurology Cardiology  Procedures  Echocardiogram Carotid doppler  Antibiotics   Anti-infectives    None      Subjective:   [name] seen and examined today.  Complains of not being able to move the right side. Denies chest pain, shortness of breath, abdominal pain, nausea vomiting, diarrhea or constipation, headache or dizziness.    Objective:   Vitals:   10/18/16 0352 10/18/16 0547 10/18/16 1029 10/18/16 1333  BP: (!) 183/112 (!) 179/111 (!) 185/110 (!) 177/109  Pulse: 90  (!) 103 95  Resp: 18 18 20 20   Temp: 98.4 F (36.9 C) 98.2 F (36.8 C) 98 F (36.7 C) 97.7 F (36.5 C)  TempSrc: Oral Oral Oral Oral  SpO2: 99% 98% 99% 98%  Weight:      Height:        Intake/Output Summary (Last 24 hours) at 10/18/16 1440 Last data filed at 10/18/16 0304  Gross per 24 hour  Intake              480 ml  Output              100 ml  Net              380 ml   Filed Weights   10/17/16 2132  Weight: 115.5 kg (254 lb 10.1 oz)    Exam  General: Well developed, well nourished, NAD, appears stated age  HEENT: NCAT, PERRLA, EOMI, Anicteic Sclera, mucous membranes moist.   Cardiovascular: S1 S2 auscultated, no rubs, murmurs or gallops. Regular rate and rhythm.  Respiratory: Clear to auscultation bilaterally with equal chest rise  Abdomen: Soft, nontender, nondistended, +  bowel sounds  Extremities: warm dry without cyanosis clubbing or edema  Neuro: AAOx3, cranial nerves grossly intact. RUE strength 0/5, RLE 1/5, LUE/LLE 5/5  Psych: Normal affect and demeanor with intact judgement and insight   Data Reviewed: I have personally reviewed following labs and imaging studies  CBC:  Recent Labs Lab 10/17/16 0850 10/18/16 0733  WBC 12.3* 9.3  NEUTROABS 8.1*  --   HGB 17.8* 15.9  HCT 48.4 45.2  MCV  94.2 96.2  PLT 257 219   Basic Metabolic Panel:  Recent Labs Lab 10/17/16 0850 10/18/16 0733  NA 142 143  K 3.4* 3.5  CL 105 111  CO2 27 23  GLUCOSE 101* 97  BUN 48* 33*  CREATININE 1.80* 1.56*  CALCIUM 9.2 8.7*   GFR: Estimated Creatinine Clearance: 65.1 mL/min (A) (by C-G formula based on SCr of 1.56 mg/dL (H)). Liver Function Tests:  Recent Labs Lab 10/17/16 0850  AST 59*  ALT 48  ALKPHOS 72  BILITOT 1.4*  PROT 8.0  ALBUMIN 3.7   No results for input(s): LIPASE, AMYLASE in the last 168 hours. No results for input(s): AMMONIA in the last 168 hours. Coagulation Profile:  Recent Labs Lab 10/17/16 0850  INR 1.25   Cardiac Enzymes:  Recent Labs Lab 10/17/16 1948  TROPONINI 0.21*   BNP (last 3 results) No results for input(s): PROBNP in the last 8760 hours. HbA1C: No results for input(s): HGBA1C in the last 72 hours. CBG:  Recent Labs Lab 10/17/16 0857  GLUCAP 107*   Lipid Profile: No results for input(s): CHOL, HDL, LDLCALC, TRIG, CHOLHDL, LDLDIRECT in the last 72 hours. Thyroid Function Tests: No results for input(s): TSH, T4TOTAL, FREET4, T3FREE, THYROIDAB in the last 72 hours. Anemia Panel: No results for input(s): VITAMINB12, FOLATE, FERRITIN, TIBC, IRON, RETICCTPCT in the last 72 hours. Urine analysis:    Component Value Date/Time   COLORURINE YELLOW 10/17/2016 0836   APPEARANCEUR HAZY (A) 10/17/2016 0836   LABSPEC 1.023 10/17/2016 0836   PHURINE 5.0 10/17/2016 0836   GLUCOSEU NEGATIVE 10/17/2016 0836   HGBUR NEGATIVE 10/17/2016 0836   BILIRUBINUR NEGATIVE 10/17/2016 0836   KETONESUR 5 (A) 10/17/2016 0836   PROTEINUR 100 (A) 10/17/2016 0836   UROBILINOGEN 1.0 04/08/2008 2101   NITRITE NEGATIVE 10/17/2016 0836   LEUKOCYTESUR NEGATIVE 10/17/2016 0836   Sepsis Labs: (procalcitonin:4,lacticidven:4)  )No results found for this or any previous visit (from the past 240 hour(s)).    Radiology Studies: Dg Chest 2  View  Result Date: 10/17/2016 CLINICAL DATA:  Uncontrolled hypertension and right-sided weakness EXAM: CHEST  2 VIEW COMPARISON:  04/28/2008 FINDINGS: Cardiac shadow is at the upper limits of normal in size. The lungs are well aerated bilaterally. Previously seen left basilar changes have resolved in the interval. No acute bony abnormality is seen. IMPRESSION: No active cardiopulmonary disease. Electronically Signed   By: Alcide Clever M.D.   On: 10/17/2016 15:17   Dg Ankle 2 Views Right  Result Date: 10/18/2016 CLINICAL DATA:  Right ankle pain.  No injury. EXAM: RIGHT ANKLE - 2 VIEW COMPARISON:  None. FINDINGS: There is no evidence of fracture, dislocation, or joint effusion. The talar dome is intact. The ankle mortise is symmetric. Achilles and plantar enthesopathy. Dorsal spurring of the talar neck. Bone mineralization is normal. Soft tissues are unremarkable. IMPRESSION: No acute osseous abnormality. Electronically Signed   By: Obie Dredge M.D.   On: 10/18/2016 13:36   Ct Head Wo Contrast  Result Date: 10/17/2016 CLINICAL DATA:  Right-sided weakness and facial droop with leaning. Symptoms since waking. EXAM: CT HEAD WITHOUT CONTRAST TECHNIQUE: Contiguous axial images were obtained from the base of the skull through the vertex without intravenous contrast. COMPARISON:  None. FINDINGS: Brain: No evidence of acute cortical infarction. No hemorrhage, hydrocephalus, or masslike findings. There is advanced small-vessel type ischemic injury in the cerebral white matter and likely right pons. In general these changes are chronic, but specific areas of insults are age-indeterminate by CT. Small remote appearing high right frontal cortex infarct. Vascular: Atherosclerotic calcification. Vessels appear large in tortuous, which can be a sign of chronic hypertension. Skull: No acute or aggressive finding. Sinuses/Orbits: Negative. IMPRESSION: 1. No acute hemorrhage or definite acute infarct. 2. Advanced chronic  small vessel ischemia which could easily obscure an acute white matter or deep gray nucleus infarct. 3. Small remote appearing right frontal cortex infarct. Electronically Signed   By: Marnee Spring M.D.   On: 10/17/2016 09:42   Mr Maxine Glenn Head Wo Contrast  Result Date: 10/17/2016 CLINICAL DATA:  Initial evaluation for acute right-sided weakness, right-sided facial droop. EXAM: MRI HEAD WITHOUT AND WITH CONTRAST MRA HEAD WITHOUT CONTRAST MRA NECK WITHOUT AND WITH CONTRAST TECHNIQUE: Multiplanar, multiecho pulse sequences of the brain and surrounding structures were obtained without intravenous contrast. Angiographic images of the Circle of Willis were obtained using MRA technique without intravenous contrast. Angiographic images of the neck were obtained using MRA technique without and with intravenous contrast. Carotid stenosis measurements (when applicable) are obtained utilizing NASCET criteria, using the distal internal carotid diameter as the denominator. CONTRAST:  20mL MULTIHANCE GADOBENATE DIMEGLUMINE 529 MG/ML IV SOLN COMPARISON:  Prior CT from earlier same day. FINDINGS: MRI HEAD FINDINGS Study degraded by motion artifact. Diffuse prominence of the CSF containing spaces compatible with generalized cerebral atrophy. Extensive patchy and confluent T2/FLAIR hyperintensity within the periventricular and deep white matter both cerebral hemispheres, consistent with chronic microvascular disease. Superimposed remote lacunar infarcts present within the bilateral basal ganglia as well as the periventricular and deep white matter both cerebral hemispheres. Remote lacunar infarct present within the pons and middle cerebellar peduncle is as well. Approximate 2 cm acute ischemic infarct seen involving the posterior limb of the left and sternal capsule extending into the posterior left corona radiata (series 3, image 27). Patchy extension into the left subinsular white matter (series 3, image 24). Additional 5 mm  ischemic infarct at the posterior left mesial temporal lobe (series 3, image 21). Few additional punctate cortical and subcortical infarcts seen involving the right cerebral hemisphere (series 3, image 33, 16) as well as the superior left cerebral hemisphere (series 3, image 35). Probable additional acute/early subacute punctate left cerebellar infarct (series 7, image 14). No associated hemorrhage or mass effect. The no evidence for acute intracranial hemorrhage. Single subcentimeter chronic microhemorrhage noted at the right external capsule (series 10, image 16). No mass lesion, midline shift or mass effect. No hydrocephalus. No extra-axial fluid collection. Major dural sinuses patent. No abnormal enhancement. Pituitary gland normal. Right vertebral artery diminutive and not well visualized. Major intracranial vascular flow voids otherwise maintained. Craniocervical junction normal. Upper cervical spine within normal limits. Bone marrow signal intensity normal. No scalp soft tissue abnormality. Globes and orbital soft tissues within normal limits. Mild mucosal thickening within the ethmoidal air cells and maxillary sinuses. Superimposed small left maxillary sinus retention cyst noted. Mastoids are clear. Inner ear structures normal. MRA HEAD FINDINGS ANTERIOR CIRCULATION: Study moderately degraded by motion artifact. Petrous, cavernous, and supraclinoid segments of  the internal carotid arteries are patent without flow-limiting stenosis. Origin of the ophthalmic arteries patent bilaterally. ICA termini widely patent. A1 segments patent bilaterally. Anterior communicating artery not well seen on this motion degraded exam. There is an apparent focal severe stenosis of the mid-distal left A2 segment (series 6, image 116). Possible additional focal moderate stenosis involving the distal right A2 segment (series 6, image 133). M1 segments patent without stenosis or occlusion. MCA bifurcations normal. No obvious  proximal M2 occlusion. Possible short-segment moderate to severe proximal left M2 stenosis noted (series 604, image 13). Distal MCA branches not well evaluated on this motion degraded exam, but are grossly symmetric and well perfused. POSTERIOR CIRCULATION: Left vertebral artery dominant and widely patent to the vertebrobasilar junction. Right vertebral artery diffusely hypoplastic and patent as well without obvious stenosis. Origin of the posterior inferior cerebral arteries not seen on this exam. Basilar artery mildly tortuous but widely patent to its distal aspect without stenosis. Superior cerebral arteries patent proximally. Both of the posterior cerebral arteries primarily supplied via the basilar. PCAs widely patent proximally, not well evaluated distally due to motion. No aneurysm or vascular malformation. MRA NECK FINDINGS Source images reviewed. Visualized aortic arch of normal caliber with normal 3 vessel morphology. No flow-limiting stenosis about the origin of the great vessels. Visualized subclavian arteries widely patent. Right common and internal carotid artery is widely patent within the neck without high-grade or critical stenosis. No significant atheromatous narrowing about the right carotid bifurcation. Left common and internal carotid artery's widely patent within the neck without high-grade or critical stenosis. Mild atheromatous irregularity about the left carotid bifurcation without flow-limiting stenosis. Both vertebral arteries arise from the subclavian arteries. Left vertebral artery dominant with a diffusely hypoplastic right vertebral artery. Short-segment moderate stenosis at the proximal right P1 segment (series 16109, image 10). Mild atheromatous irregularity within the distal right V2/V3 segments. Pre foraminal left V1 segment tortuous. No other high-grade or flow-limiting stenosis within the vertebral arteries bilaterally. IMPRESSION: MRI HEAD IMPRESSION: 1. Multifocal acute to  early subacute ischemic infarcts involving the bilateral cerebral and left cerebellar hemispheres as above, most prominent of which is a 2 cm infarct at the posterior left lentiform nucleus/corona radiata. No associated hemorrhage or mass effect. Possible central thromboembolic etiology suspected given the various vascular distributions involved. 2. Generalized age-related cerebral atrophy with advanced chronic microvascular ischemic disease. MRA HEAD IMPRESSION: 1. Negative intracranial MRA. No large or proximal arterial branch occlusion. No high-grade or correctable stenosis. 2. Question short-segment moderate to severe bilateral A2 and proximal left M2 stenoses, somewhat limited in evaluation due to extensive motion artifact on this exam. MRA NECK IMPRESSION: 1. Widely patent carotid artery systems bilaterally without high-grade or critical flow limiting stenosis. 2. Short-segment moderate proximal right V1 stenosis. Vertebral arteries otherwise widely patent within the neck. Left vertebral artery is dominant with a diffusely hypoplastic right vertebral artery. Electronically Signed   By: Rise Mu M.D.   On: 10/17/2016 15:24   Mr Angiogram Neck W Or Wo Contrast  Result Date: 10/17/2016 CLINICAL DATA:  Initial evaluation for acute right-sided weakness, right-sided facial droop. EXAM: MRI HEAD WITHOUT AND WITH CONTRAST MRA HEAD WITHOUT CONTRAST MRA NECK WITHOUT AND WITH CONTRAST TECHNIQUE: Multiplanar, multiecho pulse sequences of the brain and surrounding structures were obtained without intravenous contrast. Angiographic images of the Circle of Willis were obtained using MRA technique without intravenous contrast. Angiographic images of the neck were obtained using MRA technique without and with intravenous contrast. Carotid stenosis measurements (  when applicable) are obtained utilizing NASCET criteria, using the distal internal carotid diameter as the denominator. CONTRAST:  20mL MULTIHANCE  GADOBENATE DIMEGLUMINE 529 MG/ML IV SOLN COMPARISON:  Prior CT from earlier same day. FINDINGS: MRI HEAD FINDINGS Study degraded by motion artifact. Diffuse prominence of the CSF containing spaces compatible with generalized cerebral atrophy. Extensive patchy and confluent T2/FLAIR hyperintensity within the periventricular and deep white matter both cerebral hemispheres, consistent with chronic microvascular disease. Superimposed remote lacunar infarcts present within the bilateral basal ganglia as well as the periventricular and deep white matter both cerebral hemispheres. Remote lacunar infarct present within the pons and middle cerebellar peduncle is as well. Approximate 2 cm acute ischemic infarct seen involving the posterior limb of the left and sternal capsule extending into the posterior left corona radiata (series 3, image 27). Patchy extension into the left subinsular white matter (series 3, image 24). Additional 5 mm ischemic infarct at the posterior left mesial temporal lobe (series 3, image 21). Few additional punctate cortical and subcortical infarcts seen involving the right cerebral hemisphere (series 3, image 33, 16) as well as the superior left cerebral hemisphere (series 3, image 35). Probable additional acute/early subacute punctate left cerebellar infarct (series 7, image 14). No associated hemorrhage or mass effect. The no evidence for acute intracranial hemorrhage. Single subcentimeter chronic microhemorrhage noted at the right external capsule (series 10, image 16). No mass lesion, midline shift or mass effect. No hydrocephalus. No extra-axial fluid collection. Major dural sinuses patent. No abnormal enhancement. Pituitary gland normal. Right vertebral artery diminutive and not well visualized. Major intracranial vascular flow voids otherwise maintained. Craniocervical junction normal. Upper cervical spine within normal limits. Bone marrow signal intensity normal. No scalp soft tissue  abnormality. Globes and orbital soft tissues within normal limits. Mild mucosal thickening within the ethmoidal air cells and maxillary sinuses. Superimposed small left maxillary sinus retention cyst noted. Mastoids are clear. Inner ear structures normal. MRA HEAD FINDINGS ANTERIOR CIRCULATION: Study moderately degraded by motion artifact. Petrous, cavernous, and supraclinoid segments of the internal carotid arteries are patent without flow-limiting stenosis. Origin of the ophthalmic arteries patent bilaterally. ICA termini widely patent. A1 segments patent bilaterally. Anterior communicating artery not well seen on this motion degraded exam. There is an apparent focal severe stenosis of the mid-distal left A2 segment (series 6, image 116). Possible additional focal moderate stenosis involving the distal right A2 segment (series 6, image 133). M1 segments patent without stenosis or occlusion. MCA bifurcations normal. No obvious proximal M2 occlusion. Possible short-segment moderate to severe proximal left M2 stenosis noted (series 604, image 13). Distal MCA branches not well evaluated on this motion degraded exam, but are grossly symmetric and well perfused. POSTERIOR CIRCULATION: Left vertebral artery dominant and widely patent to the vertebrobasilar junction. Right vertebral artery diffusely hypoplastic and patent as well without obvious stenosis. Origin of the posterior inferior cerebral arteries not seen on this exam. Basilar artery mildly tortuous but widely patent to its distal aspect without stenosis. Superior cerebral arteries patent proximally. Both of the posterior cerebral arteries primarily supplied via the basilar. PCAs widely patent proximally, not well evaluated distally due to motion. No aneurysm or vascular malformation. MRA NECK FINDINGS Source images reviewed. Visualized aortic arch of normal caliber with normal 3 vessel morphology. No flow-limiting stenosis about the origin of the great vessels.  Visualized subclavian arteries widely patent. Right common and internal carotid artery is widely patent within the neck without high-grade or critical stenosis. No significant atheromatous narrowing about the  right carotid bifurcation. Left common and internal carotid artery's widely patent within the neck without high-grade or critical stenosis. Mild atheromatous irregularity about the left carotid bifurcation without flow-limiting stenosis. Both vertebral arteries arise from the subclavian arteries. Left vertebral artery dominant with a diffusely hypoplastic right vertebral artery. Short-segment moderate stenosis at the proximal right P1 segment (series 16109, image 10). Mild atheromatous irregularity within the distal right V2/V3 segments. Pre foraminal left V1 segment tortuous. No other high-grade or flow-limiting stenosis within the vertebral arteries bilaterally. IMPRESSION: MRI HEAD IMPRESSION: 1. Multifocal acute to early subacute ischemic infarcts involving the bilateral cerebral and left cerebellar hemispheres as above, most prominent of which is a 2 cm infarct at the posterior left lentiform nucleus/corona radiata. No associated hemorrhage or mass effect. Possible central thromboembolic etiology suspected given the various vascular distributions involved. 2. Generalized age-related cerebral atrophy with advanced chronic microvascular ischemic disease. MRA HEAD IMPRESSION: 1. Negative intracranial MRA. No large or proximal arterial branch occlusion. No high-grade or correctable stenosis. 2. Question short-segment moderate to severe bilateral A2 and proximal left M2 stenoses, somewhat limited in evaluation due to extensive motion artifact on this exam. MRA NECK IMPRESSION: 1. Widely patent carotid artery systems bilaterally without high-grade or critical flow limiting stenosis. 2. Short-segment moderate proximal right V1 stenosis. Vertebral arteries otherwise widely patent within the neck. Left vertebral  artery is dominant with a diffusely hypoplastic right vertebral artery. Electronically Signed   By: Rise Mu M.D.   On: 10/17/2016 15:24   Mr Laqueta Jean UE Contrast  Result Date: 10/17/2016 CLINICAL DATA:  Initial evaluation for acute right-sided weakness, right-sided facial droop. EXAM: MRI HEAD WITHOUT AND WITH CONTRAST MRA HEAD WITHOUT CONTRAST MRA NECK WITHOUT AND WITH CONTRAST TECHNIQUE: Multiplanar, multiecho pulse sequences of the brain and surrounding structures were obtained without intravenous contrast. Angiographic images of the Circle of Willis were obtained using MRA technique without intravenous contrast. Angiographic images of the neck were obtained using MRA technique without and with intravenous contrast. Carotid stenosis measurements (when applicable) are obtained utilizing NASCET criteria, using the distal internal carotid diameter as the denominator. CONTRAST:  20mL MULTIHANCE GADOBENATE DIMEGLUMINE 529 MG/ML IV SOLN COMPARISON:  Prior CT from earlier same day. FINDINGS: MRI HEAD FINDINGS Study degraded by motion artifact. Diffuse prominence of the CSF containing spaces compatible with generalized cerebral atrophy. Extensive patchy and confluent T2/FLAIR hyperintensity within the periventricular and deep white matter both cerebral hemispheres, consistent with chronic microvascular disease. Superimposed remote lacunar infarcts present within the bilateral basal ganglia as well as the periventricular and deep white matter both cerebral hemispheres. Remote lacunar infarct present within the pons and middle cerebellar peduncle is as well. Approximate 2 cm acute ischemic infarct seen involving the posterior limb of the left and sternal capsule extending into the posterior left corona radiata (series 3, image 27). Patchy extension into the left subinsular white matter (series 3, image 24). Additional 5 mm ischemic infarct at the posterior left mesial temporal lobe (series 3, image 21). Few  additional punctate cortical and subcortical infarcts seen involving the right cerebral hemisphere (series 3, image 33, 16) as well as the superior left cerebral hemisphere (series 3, image 35). Probable additional acute/early subacute punctate left cerebellar infarct (series 7, image 14). No associated hemorrhage or mass effect. The no evidence for acute intracranial hemorrhage. Single subcentimeter chronic microhemorrhage noted at the right external capsule (series 10, image 16). No mass lesion, midline shift or mass effect. No hydrocephalus. No extra-axial fluid collection. Major dural  sinuses patent. No abnormal enhancement. Pituitary gland normal. Right vertebral artery diminutive and not well visualized. Major intracranial vascular flow voids otherwise maintained. Craniocervical junction normal. Upper cervical spine within normal limits. Bone marrow signal intensity normal. No scalp soft tissue abnormality. Globes and orbital soft tissues within normal limits. Mild mucosal thickening within the ethmoidal air cells and maxillary sinuses. Superimposed small left maxillary sinus retention cyst noted. Mastoids are clear. Inner ear structures normal. MRA HEAD FINDINGS ANTERIOR CIRCULATION: Study moderately degraded by motion artifact. Petrous, cavernous, and supraclinoid segments of the internal carotid arteries are patent without flow-limiting stenosis. Origin of the ophthalmic arteries patent bilaterally. ICA termini widely patent. A1 segments patent bilaterally. Anterior communicating artery not well seen on this motion degraded exam. There is an apparent focal severe stenosis of the mid-distal left A2 segment (series 6, image 116). Possible additional focal moderate stenosis involving the distal right A2 segment (series 6, image 133). M1 segments patent without stenosis or occlusion. MCA bifurcations normal. No obvious proximal M2 occlusion. Possible short-segment moderate to severe proximal left M2 stenosis  noted (series 604, image 13). Distal MCA branches not well evaluated on this motion degraded exam, but are grossly symmetric and well perfused. POSTERIOR CIRCULATION: Left vertebral artery dominant and widely patent to the vertebrobasilar junction. Right vertebral artery diffusely hypoplastic and patent as well without obvious stenosis. Origin of the posterior inferior cerebral arteries not seen on this exam. Basilar artery mildly tortuous but widely patent to its distal aspect without stenosis. Superior cerebral arteries patent proximally. Both of the posterior cerebral arteries primarily supplied via the basilar. PCAs widely patent proximally, not well evaluated distally due to motion. No aneurysm or vascular malformation. MRA NECK FINDINGS Source images reviewed. Visualized aortic arch of normal caliber with normal 3 vessel morphology. No flow-limiting stenosis about the origin of the great vessels. Visualized subclavian arteries widely patent. Right common and internal carotid artery is widely patent within the neck without high-grade or critical stenosis. No significant atheromatous narrowing about the right carotid bifurcation. Left common and internal carotid artery's widely patent within the neck without high-grade or critical stenosis. Mild atheromatous irregularity about the left carotid bifurcation without flow-limiting stenosis. Both vertebral arteries arise from the subclavian arteries. Left vertebral artery dominant with a diffusely hypoplastic right vertebral artery. Short-segment moderate stenosis at the proximal right P1 segment (series 47829, image 10). Mild atheromatous irregularity within the distal right V2/V3 segments. Pre foraminal left V1 segment tortuous. No other high-grade or flow-limiting stenosis within the vertebral arteries bilaterally. IMPRESSION: MRI HEAD IMPRESSION: 1. Multifocal acute to early subacute ischemic infarcts involving the bilateral cerebral and left cerebellar  hemispheres as above, most prominent of which is a 2 cm infarct at the posterior left lentiform nucleus/corona radiata. No associated hemorrhage or mass effect. Possible central thromboembolic etiology suspected given the various vascular distributions involved. 2. Generalized age-related cerebral atrophy with advanced chronic microvascular ischemic disease. MRA HEAD IMPRESSION: 1. Negative intracranial MRA. No large or proximal arterial branch occlusion. No high-grade or correctable stenosis. 2. Question short-segment moderate to severe bilateral A2 and proximal left M2 stenoses, somewhat limited in evaluation due to extensive motion artifact on this exam. MRA NECK IMPRESSION: 1. Widely patent carotid artery systems bilaterally without high-grade or critical flow limiting stenosis. 2. Short-segment moderate proximal right V1 stenosis. Vertebral arteries otherwise widely patent within the neck. Left vertebral artery is dominant with a diffusely hypoplastic right vertebral artery. Electronically Signed   By: Rise Mu M.D.   On:  10/17/2016 15:24     Scheduled Meds: .  stroke: mapping our early stages of recovery book   Does not apply Once  . aspirin  325 mg Oral Daily  . heparin  5,000 Units Subcutaneous Q8H  . senna-docusate  1 tablet Oral BID   Continuous Infusions: . 0.9 % NaCl with KCl 40 mEq / L 75 mL/hr (10/18/16 1150)     LOS: 1 day   Time Spent in minutes   30 minutes  Shyloh Krinke D.O. on 10/18/2016 at 2:40 PM  Between 7am to 7pm - Pager - [phone]  After 7pm go to [email or web address] - password [email or web address]  And look for the night coverage person covering for me after hours  [facility] Office  [phone]

## 2016-10-18 NOTE — Progress Notes (Signed)
Rehab Admissions Coordinator Note:  Patient was screened by Trish Mage for appropriateness for an Inpatient Acute Rehab Consult.  At this time, we are recommending Inpatient Rehab consult.  Trish Mage 10/18/2016, 2:37 PM  I can be reached at [phone].

## 2016-10-18 NOTE — Plan of Care (Addendum)
Problem: Safety: Goal: Ability to remain free from injury will improve Outcome: Progressing Bed alarm on. Call bell within reach Total x1/2 No fall this shift  Problem: Education: Goal: Knowledge of disease or condition will improve Outcome: Progressing Verbalized understanding of prompt reporting of symptom as able and medication compliance.

## 2016-10-18 NOTE — Progress Notes (Signed)
OT Cancellation    10/18/16 0700  OT Visit Information  Last OT Received On 10/18/16  Reason Eval/Treat Not Completed Patient at procedure or test/ unavailable (Off the floor for vascular test. Will return as schedule allows. Thank you.)   Curlene Dolphin MSOT, OTR/L Acute Rehab Pager: [phone] Office: [phone]

## 2016-10-18 NOTE — Progress Notes (Signed)
Patients' blood pressure is elevated. Last blood pressure was 179/111. Dr Catha Gosselin stated that no intervention is necessary at this time. She stated that if blood pressure is decreased too much or too quickly that it could lead to inadequate perfusion. Will continue to monitor.

## 2016-10-18 NOTE — Progress Notes (Signed)
MD called to find out why patients Troponin had not been collected. Nurse called Phlebotomy who said orders had Just been received and it will take about 45 min to an hour to get the the labs drawn from the time orders are written.

## 2016-10-18 NOTE — Progress Notes (Signed)
PT Cancellation Note  Patient Details Name: Brandon [name] MRN: [id number] DOB: 05/23/1955   Cancelled Treatment:    Reason Eval/Treat Not Completed: Patient at procedure or test/unavailable   Fabio Asa 10/18/2016, 7:45 AM Charlotte Crumb, PT DPT  Board Certified Neurologic Specialist [phone]

## 2016-10-19 ENCOUNTER — Inpatient Hospital Stay (HOSPITAL_COMMUNITY): Payer: Medicaid Other

## 2016-10-19 ENCOUNTER — Encounter (HOSPITAL_COMMUNITY)
Admission: EM | Disposition: A | Discharge status: Skilled Nursing Facility | Payer: Self-pay | Source: Home / Self Care | Attending: Internal Medicine

## 2016-10-19 ENCOUNTER — Encounter (HOSPITAL_COMMUNITY): Payer: Self-pay | Admitting: *Deleted

## 2016-10-19 DIAGNOSIS — I639 Cerebral infarction, unspecified: Secondary | ICD-10-CM

## 2016-10-19 HISTORY — PX: TEE WITHOUT CARDIOVERSION: SHX5443

## 2016-10-19 LAB — CBC
HCT: 42.7 % (ref 39.0–52.0)
Hemoglobin: 14.9 g/dL (ref 13.0–17.0)
MCH: 33.4 pg (ref 26.0–34.0)
MCHC: 34.9 g/dL (ref 30.0–36.0)
MCV: 95.7 fL (ref 78.0–100.0)
PLATELETS: 205 10*3/uL (ref 150–400)
RBC: 4.46 MIL/uL (ref 4.22–5.81)
RDW: 12.8 % (ref 11.5–15.5)
WBC: 9.2 10*3/uL (ref 4.0–10.5)

## 2016-10-19 LAB — BASIC METABOLIC PANEL
ANION GAP: 6 (ref 5–15)
BUN: 27 mg/dL — ABNORMAL HIGH (ref 6–20)
CALCIUM: 8.3 mg/dL — AB (ref 8.9–10.3)
CO2: 24 mmol/L (ref 22–32)
CREATININE: 1.54 mg/dL — AB (ref 0.61–1.24)
Chloride: 110 mmol/L (ref 101–111)
GFR, EST AFRICAN AMERICAN: 55 mL/min — AB (ref >60)
GFR, EST NON AFRICAN AMERICAN: 47 mL/min — AB (ref >60)
GLUCOSE: 102 mg/dL — AB (ref 65–99)
Potassium: 3.3 mmol/L — ABNORMAL LOW (ref 3.5–5.1)
Sodium: 140 mmol/L (ref 135–145)

## 2016-10-19 LAB — ECHOCARDIOGRAM COMPLETE
Height: 71 in
WEIGHTICAEL: 4074.1 [oz_av]

## 2016-10-19 LAB — TROPONIN I: TROPONIN I: 0.11 ng/mL — AB (ref <0.03)

## 2016-10-19 LAB — CK: CK TOTAL: 116 U/L (ref 49–397)

## 2016-10-19 SURGERY — ECHOCARDIOGRAM, TRANSESOPHAGEAL
Anesthesia: Moderate Sedation

## 2016-10-19 MED ORDER — FENTANYL CITRATE (PF) 100 MCG/2ML IJ SOLN
INTRAMUSCULAR | Status: AC
  Filled 2016-10-19: qty 2

## 2016-10-19 MED ORDER — HYDRALAZINE HCL 20 MG/ML IJ SOLN
INTRAMUSCULAR | Status: AC
  Filled 2016-10-19: qty 1

## 2016-10-19 MED ORDER — BUTAMBEN-TETRACAINE-BENZOCAINE 2-2-14 % EX AERO
INHALATION_SPRAY | CUTANEOUS | Status: DC | PRN
  Administered 2016-10-19: 2 via TOPICAL

## 2016-10-19 MED ORDER — MIDAZOLAM HCL 10 MG/2ML IJ SOLN
INTRAMUSCULAR | Status: DC | PRN
  Administered 2016-10-19: 3 mg via INTRAVENOUS

## 2016-10-19 MED ORDER — MIDAZOLAM HCL 5 MG/ML IJ SOLN
INTRAMUSCULAR | Status: AC
  Filled 2016-10-19: qty 2

## 2016-10-19 MED ORDER — HYDRALAZINE HCL 20 MG/ML IJ SOLN
10.0000 mg | Freq: Once | INTRAMUSCULAR | Status: AC
Start: 2016-10-19 — End: 2016-10-19
  Administered 2016-10-19: 10 mg via INTRAVENOUS

## 2016-10-19 MED ORDER — FENTANYL CITRATE (PF) 100 MCG/2ML IJ SOLN
INTRAMUSCULAR | Status: DC | PRN
  Administered 2016-10-19: 50 ug via INTRAVENOUS

## 2016-10-19 MED ORDER — POTASSIUM CHLORIDE 10 MEQ/100ML IV SOLN
10.0000 meq | INTRAVENOUS | Status: AC
  Administered 2016-10-19 (×4): 10 meq via INTRAVENOUS
  Filled 2016-10-19 (×4): qty 100

## 2016-10-19 MED ORDER — LABETALOL HCL 5 MG/ML IV SOLN
15.0000 mg | Freq: Once | INTRAVENOUS | Status: AC
  Administered 2016-10-19: 15 mg via INTRAVENOUS

## 2016-10-19 MED ORDER — ATORVASTATIN CALCIUM 40 MG PO TABS
40.0000 mg | ORAL_TABLET | Freq: Every day | ORAL | Status: DC
  Administered 2016-10-20 – 2016-10-25 (×6): 40 mg via ORAL
  Filled 2016-10-19 (×7): qty 1

## 2016-10-19 MED ORDER — LABETALOL HCL 5 MG/ML IV SOLN
INTRAVENOUS | Status: AC
  Filled 2016-10-19: qty 4

## 2016-10-19 MED ORDER — SODIUM CHLORIDE 0.9 % IV SOLN
INTRAVENOUS | Status: DC
  Administered 2016-10-19: 14:00:00 via INTRAVENOUS

## 2016-10-19 NOTE — Progress Notes (Signed)
STROKE TEAM PROGRESS NOTE   HISTORY OF PRESENT ILLNESS (per record) Brandon [name] is a 62 y.o. male  was a past medical history of hypertension, hyperlipidemia, depression, alcohol abuse, chronic kidney disease and obesity, who presented via EMS to [facility] with right-sided weakness.  He was found on the floor by his family. EMS was called and he was brought to [facility].  Patient was noted to have right-sided weakness.  He said he was normal up until sometime on Monday, 10/15/2016 when he started noticing some weakness on the right side of his body, and gradually was unable to move the right side at all. Does not report any headaches. Does not report blurry vision. Does not report word finding difficulty.  He was hypertensive on arrival at the emergency room. Also had elevated troponin.  Noncontrast CT of the head was done that did not show any evidence of acute stroke.  MRI of the brain was done, that showed multiple acute/subacute ischemic infarcts in bilateral cerebral hemispheres and left cerebellar hemisphere.  LKW: At least 48 hours prior to presentation Premorbid modified Rankin scale (mRS): 0-Completely asymptomatic and back to baseline post-stroke  Patient was not administered IV t-PA secondary to arriving outside of the tPA treatment window. He was admitted to General Neurology for further evaluation and treatment.   SUBJECTIVE (INTERVAL HISTORY) No family is at the bedside.  The pt is in restroom  TEE with Dr. Jacinto Halim at 3 pm today   OBJECTIVE Temp:  [97.7 F (36.5 C)-98.3 F (36.8 C)] 98.3 F (36.8 C) (09/21 1045) Pulse Rate:  [64-106] 106 (09/21 1045) Cardiac Rhythm: Normal sinus rhythm (09/21 0700) Resp:  [20] 20 (09/21 1045) BP: (171-205)/(100-123) 184/123 (09/21 1045) SpO2:  [93 %-99 %] 96 % (09/21 1045)  CBC:   Recent Labs Lab 10/17/16 0850 10/18/16 0733 10/19/16 0311  WBC 12.3* 9.3 9.2  NEUTROABS 8.1*  --   --   HGB 17.8* 15.9 14.9  HCT 48.4 45.2 42.7   MCV 94.2 96.2 95.7  PLT 257 219 205    Basic Metabolic Panel:   Recent Labs Lab 10/18/16 0733 10/19/16 0311  NA 143 140  K 3.5 3.3*  CL 111 110  CO2 23 24  GLUCOSE 97 102*  BUN 33* 27*  CREATININE 1.56* 1.54*  CALCIUM 8.7* 8.3*    Lipid Panel:     Component Value Date/Time   CHOL 177 10/18/2016 1452   TRIG 232 (H) 10/18/2016 1452   HDL 20 (L) 10/18/2016 1452   CHOLHDL 8.9 10/18/2016 1452   VLDL 46 (H) 10/18/2016 1452   LDLCALC 111 (H) 10/18/2016 1452   HgbA1c:  Lab Results  Component Value Date   HGBA1C 4.7 (L) 10/18/2016   Urine Drug Screen:     Component Value Date/Time   LABOPIA NONE DETECTED 10/17/2016 0836   COCAINSCRNUR NONE DETECTED 10/17/2016 0836   LABBENZ NONE DETECTED 10/17/2016 0836   AMPHETMU NONE DETECTED 10/17/2016 0836   THCU NONE DETECTED 10/17/2016 0836   LABBARB NONE DETECTED 10/17/2016 0836    Alcohol Level     Component Value Date/Time   ETH <5 10/17/2016 0850    IMAGING  Dg Chest 2 View 10/17/2016 IMPRESSION: No active cardiopulmonary disease.  Ct Head Wo Contrast 10/17/2016 IMPRESSION: 1. No acute hemorrhage or definite acute infarct. 2. Advanced chronic small vessel ischemia which could easily obscure an acute white matter or deep gray nucleus infarct. 3. Small remote appearing right frontal cortex infarct.   Mr  Brain W Wo Contrast 10/17/2016 MRI HEAD IMPRESSION: 1. Multifocal acute to early subacute ischemic infarcts involving the bilateral cerebral and left cerebellar hemispheres as above, most prominent of which is a 2 cm infarct at the posterior left lentiform nucleus/corona radiata. No associated hemorrhage or mass effect. Possible central thromboembolic etiology suspected given the various vascular distributions involved. 2. Generalized age-related cerebral atrophy with advanced chronic microvascular ischemic disease.   Mr Maxine Glenn Head Wo Contrast 10/17/2016 MRA HEAD IMPRESSION: 1. Negative intracranial MRA. No large or  proximal arterial branch occlusion. No high-grade or correctable stenosis. 2. Question short-segment moderate to severe bilateral A2 and proximal left M2 stenoses, somewhat limited in evaluation due to extensive motion artifact on this exam.   Mr Angiogram Neck W Or Wo Contrast 10/17/2016 MRA NECK IMPRESSION: 1. Widely patent carotid artery systems bilaterally without high-grade or critical flow limiting stenosis. 2. Short-segment moderate proximal right V1 stenosis. Vertebral arteries otherwise widely patent within the neck. Left vertebral artery is dominant with a diffusely hypoplastic right vertebral artery.   Carotid US 10/18/2016 Preliminary findings: Bilateral 1-39% ICA stenosis, antegrade vertebral flow.  TTE 10/18/2016 Left ventricle: The cavity size was normal. There was severe   concentric hypertrophy. Systolic function was normal. The   estimated ejection fraction was in the range of 60% to 65%. Cross   motion was normal; there were no regional Fabro motion   abnormalities  TEE (Dr. Jacinto Halim) 10/19/2016  pending    PHYSICAL EXAM Pleasant middle aged male not in distress. . Afebrile. Head is nontraumatic. Neck is supple without bruit.    Cardiac exam no murmur or gallop. Lungs are clear to auscultation. Distal pulses are well felt. Neurological Exam :  Awake alert oriented 3. Mild dysarthria. Follows commands well. Extraocular moments are full range without nystagmus. Face is asymmetric with right lower facial weakness. Tongue is midline. Motor system exam right hemiplegia with 1/5 right-sided strength. Good normal left-sided strength. Slight diminished sensation on the right. Coordination cannot be tested on the right normal on the left. Gait not tested. ASSESSMENT/PLAN Mr. Brandon [name] is a 61 y.o. male with history of  hypertension, hyperlipidemia, depression, alcohol abuse, chronic kidney disease and obesity, who presented via EMS to [facility] with right-sided weakness. He did  not receive IV t-PA due to arriving outside of the tPA treatment window.   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  TEE 10/19/2016.  Resultant  right hemiplegia and facial weakness  CT head: no acute stroke.  Advanced small vessel disease.  MRI head: Multifocal acute/early subacute bilateral cerebral and left cerebellar ischemic infarcts  MRA head: no LVO or high-grade stenosis  Carotid Doppler: Preliminary findings: Bilateral 1-39% ICA stenosis, antegrade vertebral flow. 2D Echo: Left ventricle: The cavity size was normal. There was severe   concentric hypertrophy. Systolic function was normal. The   estimated ejection fraction was in the range of 60% to 65%. Ruminski   motion was normal; there were no regional Aina motion    abnormalities  TEE: pending   LDL  111  HgbA1c 4.7   Heparin 5,000 units [place] Q8H for VTE prophylaxis Diet NPO time specified Except for: Other (See Comments)  No antithrombotic prior to admission, now on aspirin 325 mg daily  Patient counseled to be compliant with his antithrombotic medications  Ongoing aggressive stroke risk  factor management  Therapy recommendations: CIR  Disposition: pending  Hypertension  Stable  Permissive hypertension (OK if < 220/120) but gradually normalize in 5-7 days  Long-term BP goal normotensive  Hyperlipidemia  Home meds:  none  LDL  pending, goal < 70  Add atorvastatin 40 mg PO daily  Continue statin at discharge  Other Stroke Risk Factors  ETOH use, advised to drink no more than 2 drink(s) a day  Obesity, Body mass index is 35.51 kg/m., recommend weight loss, diet and exercise as appropriate   Other Active Problems  CKD, Stage 3a: Scr 1.56 (chronic baseline), GFR 47  Downward deflecting ST segment on  EKG: troponin negative x 3, CK pending.  TTE and TEE pending.  Likely chronic CAD.  Hospital day # 2  I have personally examined this patient, reviewed notes, independently viewed imaging studies, participated in medical decision making and plan of care.ROS completed by me personally and pertinent positives fully documented  I have made any additions or clarifications directly to the above note. He presented with right hemiplegia secondary to large left brain subcortical infarct but MRI shows bilateral anterior and posterior circulation infarcts raising possibility of cardioembolic source. Recommend continue ongoing stroke workup and check transesophageal echocardiogram and loop recorder for long-term monitoring for paroxysmal A. fib. Continue aspirin.Add statin.Long discussion with patient. Discussed with Dr. Catha Gosselin  .   Delia Heady, MD Medical Director [facility] Pager: [phone] 10/19/2016 11:49 AM   To contact Stroke Continuity provider, please refer to [email or web address]. After hours, contact General Neurology

## 2016-10-19 NOTE — CV Procedure (Signed)
TEE: Under moderate sedation, TEE was performed without complications: LV: Normal. Normal EF.  Moderate LVH RV: Normal LA: Normal. Left atrial appendage: Normal without thrombus. Normal function. Inter atrial septum shows a moderate sized PFO with moderately positive right to left shunting with double contrast injection at rest and very strongly positive shunting with strain. Eusthesian valve noted RA: Normal  MV: Redundant MV chordae with prolapse of the chordae into the LVOT without LVOT obstruction. Trace MVP with trace MR. No myxomatous degeneration noted TV: Normal Trace TR AV: Normal. Mild AI or AS. PV: Normal. Trace PI.  Thoracic and ascending aorta: Normal without significant plaque or atheromatous changes.  Conscious sedation protocol was followed, I personally administered conscious sedation and monitored the patient. Patient received 3 milligrams of Versed and 50 mcg of fentanyl. Patient tolerated the procedure well and there was no complication from conscious sedation. Time administered was 10 minutes.

## 2016-10-19 NOTE — Progress Notes (Signed)
  Echocardiogram Echocardiogram Transesophageal has been performed.  Brandon [name] F 10/19/2016, 3:52 PM

## 2016-10-19 NOTE — NC FL2 (Signed)
[place] MEDICAID FL2 LEVEL OF CARE SCREENING TOOL     IDENTIFICATION  Patient Name: Brandon [name] Birthdate: 08/24/1955 Sex: male Admission Date (Current Location): 10/17/2016  [facility] and [place] Number:  Producer, television/film/video and Address:  [facility], [address] [place], [place] [address]      Provider Number: [id number]  Attending Physician Name and Address:  Edsel Petrin, DO  Relative Name and Phone Number:       Current Level of Care: Hospital Recommended Level of Care: Skilled Nursing Facility Prior Approval Number:    Date Approved/Denied:   PASRR Number: [id number]  Discharge Plan: SNF    Current Diagnoses: Patient Active Problem List   Diagnosis Date Noted  . Right flaccid hemiplegia (HCC)   . CVA (cerebral vascular accident) (HCC) 10/17/2016  . Depression 05/20/2011  . ETOH abuse 05/20/2011  . Renal insufficiency 05/20/2011  . Obesity 05/20/2011  . HYPERLIPIDEMIA 04/22/2008  . HYPERTENSION 04/22/2008  . PLEURAL EFFUSION, LEFT 04/22/2008    Orientation RESPIRATION BLADDER Height & Weight     Self, Time, Situation, Place  Normal External catheter, Incontinent (placed 10/17/16) Weight: 254 lb 10.1 oz (115.5 kg) Height:   (180.3 cm)  BEHAVIORAL SYMPTOMS/MOOD NEUROLOGICAL BOWEL NUTRITION STATUS      Incontinent Diet (see DC summary)  AMBULATORY STATUS COMMUNICATION OF NEEDS Skin   Extensive Assist Verbally Normal                       Personal Care Assistance Level of Assistance  Bathing, Feeding, Dressing Bathing Assistance: Maximum assistance Feeding assistance: Limited assistance Dressing Assistance: Maximum assistance     Functional Limitations Info             SPECIAL CARE FACTORS FREQUENCY  PT (By licensed PT), OT (By licensed OT)     PT Frequency: 5x/wk OT Frequency: 5x/wk            Contractures Contractures Info: Not present    Additional Factors Info  Code Status,  Allergies Code Status Info: Full Allergies Info: NKA           Current Medications (10/19/2016):  This is the current hospital active medication list Current Facility-Administered Medications  Medication Dose Route Frequency Provider Last Rate Last Dose  .  stroke: mapping our early stages of recovery book   Does not apply Once Richarda Overlie, MD      . 0.9 %  sodium chloride infusion   Intravenous Continuous Yates Decamp, MD      . 0.9 % NaCl with KCl 40 mEq / L  infusion   Intravenous Continuous Richarda Overlie, MD 75 mL/hr at 10/19/16 0630 75 mL/hr at 10/19/16 0630  . acetaminophen (TYLENOL) tablet 650 mg  650 mg Oral Q4H PRN Richarda Overlie, MD       Or  . acetaminophen (TYLENOL) solution 650 mg  650 mg Per Tube Q4H PRN Richarda Overlie, MD       Or  . acetaminophen (TYLENOL) suppository 650 mg  650 mg Rectal Q4H PRN Richarda Overlie, MD      . ALPRAZolam Prudy Feeler) tablet 0.5 mg  0.5 mg Oral BID PRN Richarda Overlie, MD   0.5 mg at 10/18/16 2110  . aspirin tablet 325 mg  325 mg Oral Daily Richarda Overlie, MD   325 mg at 10/19/16 1050  . heparin injection 5,000 Units  5,000 Units Subcutaneous Q8H Cherre Huger, RPH   5,000 Units  at 10/19/16 1246  . hydrALAZINE (APRESOLINE) injection 5 mg  5 mg Intravenous Q6H PRN Edsel Petrin, DO   5 mg at 10/19/16 0054  . ondansetron (ZOFRAN) injection 4 mg  4 mg Intravenous Q6H PRN Abrol, Germain Osgood, MD      . potassium chloride 10 mEq in 100 mL IVPB  10 mEq Intravenous Q1 Hr x 4 Mikhail, [place], DO 100 mL/hr at 10/19/16 1251 10 mEq at 10/19/16 1251  . senna-docusate (Senokot-S) tablet 1 tablet  1 tablet Oral BID Richarda Overlie, MD   1 tablet at 10/19/16 1050     Discharge Medications: Please see discharge summary for a list of discharge medications.  Relevant Imaging Results:  Relevant Lab Results:   Additional Information SS#: [id number]  Baldemar Lenis, LCSW

## 2016-10-19 NOTE — Interval H&P Note (Signed)
History and Physical Interval Note:  10/19/2016 3:07 PM  Brandon [name]  has presented today for surgery, with the diagnosis of stroke  The various methods of treatment have been discussed with the patient and family. After consideration of risks, benefits and other options for treatment, the patient has consented to  Procedure(s): TRANSESOPHAGEAL ECHOCARDIOGRAM (TEE) (N/A) as a surgical intervention .  The patient's history has been reviewed, patient examined, no change in status, stable for surgery.  I have reviewed the patient's chart and labs.  Questions were answered to the patient's satisfaction.     Yates Decamp

## 2016-10-19 NOTE — Progress Notes (Signed)
Call placed to Dr. Jacinto Halim to inform him of BP on arrival to endoscopy for TEE was 219/126. Hydralazine  IV given per MAR. Recheck BP 213/120. Verbal orders received for Hydralizine  IV x1 now and labetolol  IV x1 now. Given per [facility]. Dr. Jacinto Halim will reassess when arrives for TEE.

## 2016-10-19 NOTE — H&P (View-Only) (Signed)
Recent stroke. Mild trop elevation without any ongoing signs of ischemia. Admit to hospitalist/neurology. Echocardiogram now, stress test in a few days. Bleeding risks outweighs benefits of coronary angiography/angioplasty at this time.  Full consult note to follow.  Manish J Patwardhan, MD [facility] Pager: [phone] Office: [phone] If no answer Cell [phone]   

## 2016-10-19 NOTE — Progress Notes (Signed)
Physical Therapy Treatment Patient Details Name: Brandon [name] MRN: [id number] DOB: 07-Dec-1955 Today's Date: 10/19/2016    History of Present Illness 61 year old male with a history of depression, alcohol abuse, chronic kidney disease, morbid obesity, found on the floor by his father. Imaging revealed Multifocal acute to early subacute ischemic infarcts involving he bilateral cerebral and left cerebellar hemispheres.    PT Comments    Pt is progressing with his mobility. Session focused on sit to stand and standing with stedy for safety.  We also worked on trying to make pt more independent with bed mobility by teaching him to move his right hand and right leg with his strong side.  Pt continues to be appropriate for CIR level therapies as discharge.    Follow Up Recommendations  CIR     Equipment Recommendations  Wheelchair (measurements PT);Wheelchair cushion (measurements PT) (20x 20)    Recommendations for Other Services Rehab consult     Precautions / Restrictions Precautions Precautions: Fall Precaution Comments: right sided weakness    Mobility  Bed Mobility Overal bed mobility: Needs Assistance Bed Mobility: Supine to Sit;Sit to Supine     Supine to sit: +2 for physical assistance;Mod assist Sit to supine: +2 for physical assistance;Mod assist   General bed mobility comments: Cues to reach over and get right arm, use hooking technique to get right leg over EOB. Most assist needed to support trunk to get to sitting EOB.  Pt pulling on bed rail.  Most assist needed to help legs to get back into bed.    Transfers Overall transfer level: Needs assistance   Transfers: Sit to/from Stand Sit to Stand: +2 physical assistance;Mod assist;From elevated surface         General transfer comment: Stood multiple times at EOB two person mod assist to help power up (using momentum "1, 2, 3..." and to weight shift to the left more.  Verbal cues for upright posture, chest up,  hip extension.  Pt stood up x 3 with steady before transporters came to get him for procedure.   Ambulation/Gait             General Gait Details: unable at this time.         Modified Rankin (Stroke Patients Only) Modified Rankin (Stroke Patients Only) Pre-Morbid Rankin Score: No symptoms Modified Rankin: Severe disability     Balance Overall balance assessment: Needs assistance Sitting-balance support: Feet supported;Single extremity supported Sitting balance-Leahy Scale: Poor Sitting balance - Comments: Min assist EOB at times to maintain sitting balance (when moving feet or trying to scoot, or when he takes his left hand off of bed or rail, increases up to mod assist).  Better sense of midline today.    Standing balance support: Single extremity supported Standing balance-Leahy Scale: Poor Standing balance comment: Needs assist of steady and two person assist to support pt in standing.                             Cognition Arousal/Alertness: Awake/alert Behavior During Therapy: Flat affect (a tad flat) Overall Cognitive Status: Impaired/Different from baseline Area of Impairment: Following commands;Memory;Orientation;Attention;Awareness                 Orientation Level: Disoriented to;Time Current Attention Level: Sustained Memory: Decreased short-term memory Following Commands: Follows one step commands consistently   Awareness: Emergent Problem Solving: Difficulty sequencing;Requires tactile cues;Requires verbal cues General Comments: Pt processing speed is better, still  does better with simple commands.  Decreased STM (not recalling that he had been up today already), and thinks it is Oct.              Pertinent Vitals/Pain Pain Assessment: No/denies pain           PT Goals (current goals can now be found in the care plan section) Acute Rehab PT Goals Patient Stated Goal: to get back to prior function Progress towards PT goals:  Progressing toward goals    Frequency    Min 4X/week      PT Plan Current plan remains appropriate       AM-PAC PT "6 Clicks" Daily Activity  Outcome Measure  Difficulty turning over in bed (including adjusting bedclothes, sheets and blankets)?: Unable Difficulty moving from lying on back to sitting on the side of the bed? : Unable Difficulty sitting down on and standing up from a chair with arms (e.g., wheelchair, bedside commode, etc,.)?: Unable Help needed moving to and from a bed to chair (including a wheelchair)?: A Lot Help needed walking in hospital room?: Total Help needed climbing 3-5 steps with a railing? : Total 6 Click Score: 7    End of Session Equipment Utilized During Treatment: Gait belt Activity Tolerance: Patient tolerated treatment well Patient left: in bed;Other (comment) (with transfporters)   PT Visit Diagnosis: Hemiplegia and hemiparesis Hemiplegia - Right/Left: Right Hemiplegia - dominant/non-dominant: Dominant Hemiplegia - caused by: Cerebral infarction     Time: 6045-4098 PT Time Calculation (min) (ACUTE ONLY): 23 min  Charges:  $Therapeutic Activity: 23-37 mins          Milano Rosevear B. Molleigh Huot, PT, DPT [phone]            10/19/2016, 2:26 PM

## 2016-10-19 NOTE — Progress Notes (Signed)
Rehab admissions - I met with patient and his brother this am.  Patient is awaiting TEE.  Patient lives with his father.  Brother lives 45 minutes away.  I gave patient rehab booklets and explained inpatient rehab.  I will follow up once patient has been up with therapies.  Call me for questions.  #[phone] 

## 2016-10-19 NOTE — Progress Notes (Signed)
PROGRESS NOTE    Brandon [name]  UJW:[id number] DOB: 12/14/1955 DOA: 10/17/2016 PCP: Patient, No Pcp Per   Chief Complaint  Patient presents with  . Cerebrovascular Accident    Brief Narrative:  HPI on 10/17/2016 by Dr. Richarda Overlie 61 year old male with a history of depression, alcohol abuse, chronic kidney disease, morbid obesity, found on the floor by his father, EMS was called. Patient noted to have right-sided weakness including right upper right lower extremity weakness, according to the patient onset 48 hours ago prior to presentation. Patient also had a headache but no nausea vomiting. Denied any blurry vision, slurred speech, difficulty swallowing Assessment & Plan   Acute CVA -CTA showed no acute hemorrhage or definite acute infarct -MRI head showed multifocal acute to early subacute ischemic infarcts involving bilateral cerebral and left cerebellar hemispheres -MRA head unremarkable -MRA neck widely patent carotid artery systems bilaterally without high-grade critical flow limiting stenosis. -LDL 111, hemglobin A1c 4.7 -Echocardiogram EF 60-65%, grade 1 diastolic dysfunction -Carotid Doppler shows 139% ICA stenosis, antegrade vertebral flow -Continue aspirin -Neurology consulted and appreciated -PT recommended CIR -will also ask SW to look into SNF -planning for TEE today  Elevated troponin -likely secondary to the above -currently chest pain free -Troponins trending downward and flat -heparin drip held given CVA -Cardiology consulted and appreciated: Recommended aspirin and Plavix, hold heparin until the size of infarct is noted. Risk of hemorrhage with anticoagulation in setting of large infarct. Obtain stress test before discharge. Check lipid panel and echocardiogram.  Leukocytosis -resolved, likely secondary to the above -no complaints of shortnes of breath/cough, or urinary issues -CXR and UA unremarkable for infection -Currently afebrile    Hypokalemia -Continue to replace and monitor BMP  Chronic kidney disease, stage III  -Creatinine appears to be stable, continue to monitor BMP  DVT Prophylaxis  Heparin SQ   Code Status: Full  Family Communication: none at bedside  Disposition Plan: Admitted. Pending TEE today. Dispo TBD  Consultants Neurology Cardiology  Procedures  Echocardiogram Carotid doppler  Antibiotics   Anti-infectives    None      Subjective:   Brandon [name] seen and examined today.  States he still cannot move his right arm or leg. Denies chest pain, shortness of breath, abdominal pain, N/V/D/C.  Objective:   Vitals:   10/19/16 1413 10/19/16 1438 10/19/16 1444 10/19/16 1446  BP: (!) 219/126 (!) 213/120 (!) 187/109 (!) 155/87  Pulse:      Resp: 15     Temp: 98.3 F (36.8 C)     TempSrc: Oral     SpO2: 96%     Weight: 115.2 kg (254 lb)     Height:  (1.803 m)       Intake/Output Summary (Last 24 hours) at 10/19/16 1459 Last data filed at 10/19/16 1214  Gross per 24 hour  Intake              240 ml  Output             1075 ml  Net             -835 ml   Filed Weights   10/17/16 2132 10/19/16 1413  Weight: 115.5 kg (254 lb 10.1 oz) 115.2 kg (254 lb)   Exam  General: Well developed, well nourished, NAD, appears stated age  HEENT: NCAT, mucous membranes moist.   Cardiovascular: S1 S2 auscultated, no murmurs, RRR  Respiratory: Clear to auscultation bilaterally with equal chest rise  Abdomen: Soft,  nontender, nondistended, + bowel sounds  Extremities: warm dry without cyanosis clubbing or edema  Neuro: AAOx3,  Unable to move RLE or RUE  Psych: Appropriate  Data Reviewed: I have personally reviewed following labs and imaging studies  CBC:  Recent Labs Lab 10/17/16 0850 10/18/16 0733 10/19/16 0311  WBC 12.3* 9.3 9.2  NEUTROABS 8.1*  --   --   HGB 17.8* 15.9 14.9  HCT 48.4 45.2 42.7  MCV 94.2 96.2 95.7  PLT 257 219 205   Basic Metabolic Panel:  Recent  Labs Lab 10/17/16 0850 10/18/16 0733 10/19/16 0311  NA 142 143 140  K 3.4* 3.5 3.3*  CL 105 111 110  CO2 GLUCOSE 101* 97 102*  BUN 48* 33* 27*  CREATININE 1.80* 1.56* 1.54*  CALCIUM 9.2 8.7* 8.3*   GFR: Estimated Creatinine Clearance: 65.9 mL/min (A) (by C-G formula based on SCr of 1.54 mg/dL (H)). Liver Function Tests:  Recent Labs Lab 10/17/16 0850  AST 59*  ALT 48  ALKPHOS 72  BILITOT 1.4*  PROT 8.0  ALBUMIN 3.7   No results for input(s): LIPASE, AMYLASE in the last 168 hours. No results for input(s): AMMONIA in the last 168 hours. Coagulation Profile:  Recent Labs Lab 10/17/16 0850  INR 1.25   Cardiac Enzymes:  Recent Labs Lab 10/17/16 1948 10/18/16 1452 10/18/16 2044 10/19/16 0311  CKTOTAL  --   --   --  116  TROPONINI 0.21* 0.10* 0.10* 0.11*   BNP (last 3 results) No results for input(s): PROBNP in the last 8760 hours. HbA1C:  Recent Labs  10/18/16 1452  HGBA1C 4.7*   CBG:  Recent Labs Lab 10/17/16 0857  GLUCAP 107*   Lipid Profile:  Recent Labs  10/18/16 1452  CHOL 177  HDL 20*  LDLCALC 111*  TRIG 232*  CHOLHDL 8.9   Thyroid Function Tests: No results for input(s): TSH, T4TOTAL, FREET4, T3FREE, THYROIDAB in the last 72 hours. Anemia Panel: No results for input(s): VITAMINB12, FOLATE, FERRITIN, TIBC, IRON, RETICCTPCT in the last 72 hours. Urine analysis:    Component Value Date/Time   COLORURINE YELLOW 10/17/2016 0836   APPEARANCEUR HAZY (A) 10/17/2016 0836   LABSPEC 1.023 10/17/2016 0836   PHURINE 5.0 10/17/2016 0836   GLUCOSEU NEGATIVE 10/17/2016 0836   HGBUR NEGATIVE 10/17/2016 0836   BILIRUBINUR NEGATIVE 10/17/2016 0836   KETONESUR 5 (A) 10/17/2016 0836   PROTEINUR 100 (A) 10/17/2016 0836   UROBILINOGEN 1.0 04/08/2008 2101   NITRITE NEGATIVE 10/17/2016 0836   LEUKOCYTESUR NEGATIVE 10/17/2016 0836   Sepsis Labs: (procalcitonin:4,lacticidven:4)  )No results found for this or any previous  visit (from the past 240 hour(s)).    Radiology Studies: Dg Chest 2 View  Result Date: 10/17/2016 CLINICAL DATA:  Uncontrolled hypertension and right-sided weakness EXAM: CHEST  2 VIEW COMPARISON:  04/28/2008 FINDINGS: Cardiac shadow is at the upper limits of normal in size. The lungs are well aerated bilaterally. Previously seen left basilar changes have resolved in the interval. No acute bony abnormality is seen. IMPRESSION: No active cardiopulmonary disease. Electronically Signed   By: Alcide Clever M.D.   On: 10/17/2016 15:17   Dg Ankle 2 Views Right  Result Date: 10/18/2016 CLINICAL DATA:  Right ankle pain.  No injury. EXAM: RIGHT ANKLE - 2 VIEW COMPARISON:  None. FINDINGS: There is no evidence of fracture, dislocation, or joint effusion. The talar dome is intact. The ankle mortise is symmetric. Achilles and plantar enthesopathy. Dorsal spurring of the talar neck.  Bone mineralization is normal. Soft tissues are unremarkable. IMPRESSION: No acute osseous abnormality. Electronically Signed   By: Obie Dredge M.D.   On: 10/18/2016 13:36     Scheduled Meds: . [MAR Hold]  stroke: mapping our early stages of recovery book   Does not apply Once  . [MAR Hold] aspirin  325 mg Oral Daily  . [MAR Hold] heparin  5,000 Units Subcutaneous Q8H  . [MAR Hold] senna-docusate  1 tablet Oral BID   Continuous Infusions: . sodium chloride 20 mL/hr at 10/19/16 1416  . 0.9 % NaCl with KCl 40 mEq / L 75 mL/hr (10/19/16 0630)     LOS: 2 days   Time Spent in minutes   30 minutes  Siniyah Evangelist D.O. on 10/19/2016 at 2:59 PM  Between 7am to 7pm - Pager - [phone]  After 7pm go to [email or web address] - password [email or web address]  And look for the night coverage person covering for me after hours  [facility] Office  [phone]

## 2016-10-19 NOTE — Consult Note (Signed)
ELECTROPHYSIOLOGY CONSULT NOTE  Patient ID: Brandon [name] MRN: [id number], DOB/AGE: 60-Aug-1957   Admit date: 10/17/2016 Date of Consult: 10/19/2016  Primary Physician: Patient, No Pcp Per Primary Cardiologist: Patwardhan (new this admission) Reason for Consultation: Cryptogenic stroke; recommendations regarding Implantable Loop Recorder  History of Present Illness EP has been asked to evaluate Brandon [name] for placement of an implantable loop recorder to monitor for atrial fibrillation by Dr Pearlean Brownie.  The patient was admitted on 10/17/2016 with right sided weakness after being found on the floor by his family. Past medical history is notable for HTN, depression, ETOH abuse, CKD, and obesity. Imaging demonstrated multifocal bilateral cerebral and left cerebellar infarcts felt to be embolic 2/2 unknown source.  He has undergone workup for stroke including echocardiogram and carotid dopplers.  The patient has been monitored on telemetry which has demonstrated sinus rhythm with no arrhythmias.  Inpatient stroke work-up is to be completed with a TEE.   Echocardiogram this admission is pending.  Lab work is reviewed.  Prior to admission, the patient denies chest pain, shortness of breath, dizziness, palpitations, or syncope.     Past Medical History:  Diagnosis Date  . Depression   . Hx of pleural effusion   . Hyperlipidemia   . Hypertension      Surgical History:  Past Surgical History:  Procedure Laterality Date  . THORACENTESIS       Prescriptions Prior to Admission  Medication Sig Dispense Refill Last Dose  . diphenhydrAMINE (BENADRYL) 25 mg capsule Take 25 mg by mouth every 6 (six) hours as needed for allergies.   Past Week at Unknown time  . ALPRAZolam (XANAX) 0.5 MG tablet Take 1 tablet (0.5 mg total) by mouth at bedtime as needed. (Patient not taking: Reported on 10/17/2016) 30 tablet 0 Not Taking at Unknown time  . benazepril (LOTENSIN) 40 MG tablet Take 1 tablet (40 mg  total) by mouth daily. Needs office visit. (Patient not taking: Reported on 10/17/2016) 30 tablet 0 Not Taking at Unknown time    Inpatient Medications:  .  stroke: mapping our early stages of recovery book   Does not apply Once  . aspirin  325 mg Oral Daily  . heparin  5,000 Units Subcutaneous Q8H  . senna-docusate  1 tablet Oral BID    Allergies: No Known Allergies  Social History   Social History  . Marital status: Legally Separated    Spouse name: N/A  . Number of children: N/A  . Years of education: N/A   Occupational History  . Not on file.   Social History Main Topics  . Smoking status: Never Smoker  . Smokeless tobacco: Never Used  . Alcohol use Yes  . Drug use: No  . Sexual activity: No   Other Topics Concern  . Not on file   Social History Narrative  . No narrative on file     Family History: no premature CAD     Review of Systems: All other systems reviewed and are otherwise negative except as noted above.  Physical Exam: Vitals:   10/18/16 1657 10/18/16 2107 10/19/16 0040 10/19/16 0452  BP: (!) 177/100 (!) 195/116 (!) 205/103 (!) 171/102  Pulse: 83 74 64 87  Resp: Temp: 97.7 F (36.5 C) 97.8 F (36.6 C) 98.2 F (36.8 C) 98.2 F (36.8 C)  TempSrc: Oral Oral Oral Oral  SpO2: 93% 99% 97% 96%  Weight:      Height:  GEN- The patient is chronically ill and disheveled appearing, alert and oriented x 3 today.   Head- normocephalic, atraumatic Eyes-  Sclera clear, conjunctiva pink Ears- hearing intact Oropharynx- clear Neck- supple Lungs- Clear to ausculation bilaterally, normal work of breathing Heart- Regular rate and rhythm  GI- soft, NT, ND, + BS Extremities- no clubbing, cyanosis, or edema MS- no significant deformity or atrophy Skin- no rash or lesion Psych- euthymic mood, full affect   Labs:   Lab Results  Component Value Date   WBC 9.2 10/19/2016   HGB 14.9 10/19/2016   HCT 42.7 10/19/2016   MCV 95.7  10/19/2016   PLT 205 10/19/2016    Recent Labs Lab 10/17/16 0850  10/19/16 0311  NA 142  < > 140  K 3.4*  < > 3.3*  CL 105  < > 110  CO2 27  < > 24  BUN 48*  < > 27*  CREATININE 1.80*  < > 1.54*  CALCIUM 9.2  < > 8.3*  PROT 8.0  --   --   BILITOT 1.4*  --   --   ALKPHOS 72  --   --   ALT 48  --   --   AST 59*  --   --   GLUCOSE 101*  < > 102*  < > = values in this interval not displayed.   Radiology/Studies: Dg Chest 2 View  Result Date: 10/17/2016 CLINICAL DATA:  Uncontrolled hypertension and right-sided weakness EXAM: CHEST  2 VIEW COMPARISON:  04/28/2008 FINDINGS: Cardiac shadow is at the upper limits of normal in size. The lungs are well aerated bilaterally. Previously seen left basilar changes have resolved in the interval. No acute bony abnormality is seen. IMPRESSION: No active cardiopulmonary disease. Electronically Signed   By: Alcide Clever M.D.   On: 10/17/2016 15:17   Dg Ankle 2 Views Right  Result Date: 10/18/2016 CLINICAL DATA:  Right ankle pain.  No injury. EXAM: RIGHT ANKLE - 2 VIEW COMPARISON:  None. FINDINGS: There is no evidence of fracture, dislocation, or joint effusion. The talar dome is intact. The ankle mortise is symmetric. Achilles and plantar enthesopathy. Dorsal spurring of the talar neck. Bone mineralization is normal. Soft tissues are unremarkable. IMPRESSION: No acute osseous abnormality. Electronically Signed   By: Obie Dredge M.D.   On: 10/18/2016 13:36   Ct Head Wo Contrast  Result Date: 10/17/2016 CLINICAL DATA:  Right-sided weakness and facial droop with leaning. Symptoms since waking. EXAM: CT HEAD WITHOUT CONTRAST TECHNIQUE: Contiguous axial images were obtained from the base of the skull through the vertex without intravenous contrast. COMPARISON:  None. FINDINGS: Brain: No evidence of acute cortical infarction. No hemorrhage, hydrocephalus, or masslike findings. There is advanced small-vessel type ischemic injury in the cerebral white matter  and likely right pons. In general these changes are chronic, but specific areas of insults are age-indeterminate by CT. Small remote appearing high right frontal cortex infarct. Vascular: Atherosclerotic calcification. Vessels appear large in tortuous, which can be a sign of chronic hypertension. Skull: No acute or aggressive finding. Sinuses/Orbits: Negative. IMPRESSION: 1. No acute hemorrhage or definite acute infarct. 2. Advanced chronic small vessel ischemia which could easily obscure an acute white matter or deep gray nucleus infarct. 3. Small remote appearing right frontal cortex infarct. Electronically Signed   By: Marnee Spring M.D.   On: 10/17/2016 09:42   Mr Maxine Glenn Head Wo Contrast  Result Date: 10/17/2016 CLINICAL DATA:  Initial evaluation for acute right-sided weakness, right-sided  facial droop. EXAM: MRI HEAD WITHOUT AND WITH CONTRAST MRA HEAD WITHOUT CONTRAST MRA NECK WITHOUT AND WITH CONTRAST TECHNIQUE: Multiplanar, multiecho pulse sequences of the brain and surrounding structures were obtained without intravenous contrast. Angiographic images of the Circle of Willis were obtained using MRA technique without intravenous contrast. Angiographic images of the neck were obtained using MRA technique without and with intravenous contrast. Carotid stenosis measurements (when applicable) are obtained utilizing NASCET criteria, using the distal internal carotid diameter as the denominator. CONTRAST:  20mL MULTIHANCE GADOBENATE DIMEGLUMINE 529 MG/ML IV SOLN COMPARISON:  Prior CT from earlier same day. FINDINGS: MRI HEAD FINDINGS Study degraded by motion artifact. Diffuse prominence of the CSF containing spaces compatible with generalized cerebral atrophy. Extensive patchy and confluent T2/FLAIR hyperintensity within the periventricular and deep white matter both cerebral hemispheres, consistent with chronic microvascular disease. Superimposed remote lacunar infarcts present within the bilateral basal ganglia  as well as the periventricular and deep white matter both cerebral hemispheres. Remote lacunar infarct present within the pons and middle cerebellar peduncle is as well. Approximate 2 cm acute ischemic infarct seen involving the posterior limb of the left and sternal capsule extending into the posterior left corona radiata (series 3, image 27). Patchy extension into the left subinsular white matter (series 3, image 24). Additional 5 mm ischemic infarct at the posterior left mesial temporal lobe (series 3, image 21). Few additional punctate cortical and subcortical infarcts seen involving the right cerebral hemisphere (series 3, image 33, 16) as well as the superior left cerebral hemisphere (series 3, image 35). Probable additional acute/early subacute punctate left cerebellar infarct (series 7, image 14). No associated hemorrhage or mass effect. The no evidence for acute intracranial hemorrhage. Single subcentimeter chronic microhemorrhage noted at the right external capsule (series 10, image 16). No mass lesion, midline shift or mass effect. No hydrocephalus. No extra-axial fluid collection. Major dural sinuses patent. No abnormal enhancement. Pituitary gland normal. Right vertebral artery diminutive and not well visualized. Major intracranial vascular flow voids otherwise maintained. Craniocervical junction normal. Upper cervical spine within normal limits. Bone marrow signal intensity normal. No scalp soft tissue abnormality. Globes and orbital soft tissues within normal limits. Mild mucosal thickening within the ethmoidal air cells and maxillary sinuses. Superimposed small left maxillary sinus retention cyst noted. Mastoids are clear. Inner ear structures normal. MRA HEAD FINDINGS ANTERIOR CIRCULATION: Study moderately degraded by motion artifact. Petrous, cavernous, and supraclinoid segments of the internal carotid arteries are patent without flow-limiting stenosis. Origin of the ophthalmic arteries patent  bilaterally. ICA termini widely patent. A1 segments patent bilaterally. Anterior communicating artery not well seen on this motion degraded exam. There is an apparent focal severe stenosis of the mid-distal left A2 segment (series 6, image 116). Possible additional focal moderate stenosis involving the distal right A2 segment (series 6, image 133). M1 segments patent without stenosis or occlusion. MCA bifurcations normal. No obvious proximal M2 occlusion. Possible short-segment moderate to severe proximal left M2 stenosis noted (series 604, image 13). Distal MCA branches not well evaluated on this motion degraded exam, but are grossly symmetric and well perfused. POSTERIOR CIRCULATION: Left vertebral artery dominant and widely patent to the vertebrobasilar junction. Right vertebral artery diffusely hypoplastic and patent as well without obvious stenosis. Origin of the posterior inferior cerebral arteries not seen on this exam. Basilar artery mildly tortuous but widely patent to its distal aspect without stenosis. Superior cerebral arteries patent proximally. Both of the posterior cerebral arteries primarily supplied via the basilar. PCAs widely patent proximally,  not well evaluated distally due to motion. No aneurysm or vascular malformation. MRA NECK FINDINGS Source images reviewed. Visualized aortic arch of normal caliber with normal 3 vessel morphology. No flow-limiting stenosis about the origin of the great vessels. Visualized subclavian arteries widely patent. Right common and internal carotid artery is widely patent within the neck without high-grade or critical stenosis. No significant atheromatous narrowing about the right carotid bifurcation. Left common and internal carotid artery's widely patent within the neck without high-grade or critical stenosis. Mild atheromatous irregularity about the left carotid bifurcation without flow-limiting stenosis. Both vertebral arteries arise from the subclavian  arteries. Left vertebral artery dominant with a diffusely hypoplastic right vertebral artery. Short-segment moderate stenosis at the proximal right P1 segment (series 16109, image 10). Mild atheromatous irregularity within the distal right V2/V3 segments. Pre foraminal left V1 segment tortuous. No other high-grade or flow-limiting stenosis within the vertebral arteries bilaterally. IMPRESSION: MRI HEAD IMPRESSION: 1. Multifocal acute to early subacute ischemic infarcts involving the bilateral cerebral and left cerebellar hemispheres as above, most prominent of which is a 2 cm infarct at the posterior left lentiform nucleus/corona radiata. No associated hemorrhage or mass effect. Possible central thromboembolic etiology suspected given the various vascular distributions involved. 2. Generalized age-related cerebral atrophy with advanced chronic microvascular ischemic disease. MRA HEAD IMPRESSION: 1. Negative intracranial MRA. No large or proximal arterial branch occlusion. No high-grade or correctable stenosis. 2. Question short-segment moderate to severe bilateral A2 and proximal left M2 stenoses, somewhat limited in evaluation due to extensive motion artifact on this exam. MRA NECK IMPRESSION: 1. Widely patent carotid artery systems bilaterally without high-grade or critical flow limiting stenosis. 2. Short-segment moderate proximal right V1 stenosis. Vertebral arteries otherwise widely patent within the neck. Left vertebral artery is dominant with a diffusely hypoplastic right vertebral artery. Electronically Signed   By: Rise Mu M.D.   On: 10/17/2016 15:24   Mr Angiogram Neck W Or Wo Contrast  Result Date: 10/17/2016 CLINICAL DATA:  Initial evaluation for acute right-sided weakness, right-sided facial droop. EXAM: MRI HEAD WITHOUT AND WITH CONTRAST MRA HEAD WITHOUT CONTRAST MRA NECK WITHOUT AND WITH CONTRAST TECHNIQUE: Multiplanar, multiecho pulse sequences of the brain and surrounding structures  were obtained without intravenous contrast. Angiographic images of the Circle of Willis were obtained using MRA technique without intravenous contrast. Angiographic images of the neck were obtained using MRA technique without and with intravenous contrast. Carotid stenosis measurements (when applicable) are obtained utilizing NASCET criteria, using the distal internal carotid diameter as the denominator. CONTRAST:  20mL MULTIHANCE GADOBENATE DIMEGLUMINE 529 MG/ML IV SOLN COMPARISON:  Prior CT from earlier same day. FINDINGS: MRI HEAD FINDINGS Study degraded by motion artifact. Diffuse prominence of the CSF containing spaces compatible with generalized cerebral atrophy. Extensive patchy and confluent T2/FLAIR hyperintensity within the periventricular and deep white matter both cerebral hemispheres, consistent with chronic microvascular disease. Superimposed remote lacunar infarcts present within the bilateral basal ganglia as well as the periventricular and deep white matter both cerebral hemispheres. Remote lacunar infarct present within the pons and middle cerebellar peduncle is as well. Approximate 2 cm acute ischemic infarct seen involving the posterior limb of the left and sternal capsule extending into the posterior left corona radiata (series 3, image 27). Patchy extension into the left subinsular white matter (series 3, image 24). Additional 5 mm ischemic infarct at the posterior left mesial temporal lobe (series 3, image 21). Few additional punctate cortical and subcortical infarcts seen involving the right cerebral hemisphere (series 3, image  33, 16) as well as the superior left cerebral hemisphere (series 3, image 35). Probable additional acute/early subacute punctate left cerebellar infarct (series 7, image 14). No associated hemorrhage or mass effect. The no evidence for acute intracranial hemorrhage. Single subcentimeter chronic microhemorrhage noted at the right external capsule (series 10, image 16).  No mass lesion, midline shift or mass effect. No hydrocephalus. No extra-axial fluid collection. Major dural sinuses patent. No abnormal enhancement. Pituitary gland normal. Right vertebral artery diminutive and not well visualized. Major intracranial vascular flow voids otherwise maintained. Craniocervical junction normal. Upper cervical spine within normal limits. Bone marrow signal intensity normal. No scalp soft tissue abnormality. Globes and orbital soft tissues within normal limits. Mild mucosal thickening within the ethmoidal air cells and maxillary sinuses. Superimposed small left maxillary sinus retention cyst noted. Mastoids are clear. Inner ear structures normal. MRA HEAD FINDINGS ANTERIOR CIRCULATION: Study moderately degraded by motion artifact. Petrous, cavernous, and supraclinoid segments of the internal carotid arteries are patent without flow-limiting stenosis. Origin of the ophthalmic arteries patent bilaterally. ICA termini widely patent. A1 segments patent bilaterally. Anterior communicating artery not well seen on this motion degraded exam. There is an apparent focal severe stenosis of the mid-distal left A2 segment (series 6, image 116). Possible additional focal moderate stenosis involving the distal right A2 segment (series 6, image 133). M1 segments patent without stenosis or occlusion. MCA bifurcations normal. No obvious proximal M2 occlusion. Possible short-segment moderate to severe proximal left M2 stenosis noted (series 604, image 13). Distal MCA branches not well evaluated on this motion degraded exam, but are grossly symmetric and well perfused. POSTERIOR CIRCULATION: Left vertebral artery dominant and widely patent to the vertebrobasilar junction. Right vertebral artery diffusely hypoplastic and patent as well without obvious stenosis. Origin of the posterior inferior cerebral arteries not seen on this exam. Basilar artery mildly tortuous but widely patent to its distal aspect  without stenosis. Superior cerebral arteries patent proximally. Both of the posterior cerebral arteries primarily supplied via the basilar. PCAs widely patent proximally, not well evaluated distally due to motion. No aneurysm or vascular malformation. MRA NECK FINDINGS Source images reviewed. Visualized aortic arch of normal caliber with normal 3 vessel morphology. No flow-limiting stenosis about the origin of the great vessels. Visualized subclavian arteries widely patent. Right common and internal carotid artery is widely patent within the neck without high-grade or critical stenosis. No significant atheromatous narrowing about the right carotid bifurcation. Left common and internal carotid artery's widely patent within the neck without high-grade or critical stenosis. Mild atheromatous irregularity about the left carotid bifurcation without flow-limiting stenosis. Both vertebral arteries arise from the subclavian arteries. Left vertebral artery dominant with a diffusely hypoplastic right vertebral artery. Short-segment moderate stenosis at the proximal right P1 segment (series 16109, image 10). Mild atheromatous irregularity within the distal right V2/V3 segments. Pre foraminal left V1 segment tortuous. No other high-grade or flow-limiting stenosis within the vertebral arteries bilaterally. IMPRESSION: MRI HEAD IMPRESSION: 1. Multifocal acute to early subacute ischemic infarcts involving the bilateral cerebral and left cerebellar hemispheres as above, most prominent of which is a 2 cm infarct at the posterior left lentiform nucleus/corona radiata. No associated hemorrhage or mass effect. Possible central thromboembolic etiology suspected given the various vascular distributions involved. 2. Generalized age-related cerebral atrophy with advanced chronic microvascular ischemic disease. MRA HEAD IMPRESSION: 1. Negative intracranial MRA. No large or proximal arterial branch occlusion. No high-grade or correctable  stenosis. 2. Question short-segment moderate to severe bilateral A2 and proximal left  M2 stenoses, somewhat limited in evaluation due to extensive motion artifact on this exam. MRA NECK IMPRESSION: 1. Widely patent carotid artery systems bilaterally without high-grade or critical flow limiting stenosis. 2. Short-segment moderate proximal right V1 stenosis. Vertebral arteries otherwise widely patent within the neck. Left vertebral artery is dominant with a diffusely hypoplastic right vertebral artery. Electronically Signed   By: Rise Mu M.D.   On: 10/17/2016 15:24   Mr Laqueta Jean BJ Contrast  Result Date: 10/17/2016 CLINICAL DATA:  Initial evaluation for acute right-sided weakness, right-sided facial droop. EXAM: MRI HEAD WITHOUT AND WITH CONTRAST MRA HEAD WITHOUT CONTRAST MRA NECK WITHOUT AND WITH CONTRAST TECHNIQUE: Multiplanar, multiecho pulse sequences of the brain and surrounding structures were obtained without intravenous contrast. Angiographic images of the Circle of Willis were obtained using MRA technique without intravenous contrast. Angiographic images of the neck were obtained using MRA technique without and with intravenous contrast. Carotid stenosis measurements (when applicable) are obtained utilizing NASCET criteria, using the distal internal carotid diameter as the denominator. CONTRAST:  20mL MULTIHANCE GADOBENATE DIMEGLUMINE 529 MG/ML IV SOLN COMPARISON:  Prior CT from earlier same day. FINDINGS: MRI HEAD FINDINGS Study degraded by motion artifact. Diffuse prominence of the CSF containing spaces compatible with generalized cerebral atrophy. Extensive patchy and confluent T2/FLAIR hyperintensity within the periventricular and deep white matter both cerebral hemispheres, consistent with chronic microvascular disease. Superimposed remote lacunar infarcts present within the bilateral basal ganglia as well as the periventricular and deep white matter both cerebral hemispheres. Remote  lacunar infarct present within the pons and middle cerebellar peduncle is as well. Approximate 2 cm acute ischemic infarct seen involving the posterior limb of the left and sternal capsule extending into the posterior left corona radiata (series 3, image 27). Patchy extension into the left subinsular white matter (series 3, image 24). Additional 5 mm ischemic infarct at the posterior left mesial temporal lobe (series 3, image 21). Few additional punctate cortical and subcortical infarcts seen involving the right cerebral hemisphere (series 3, image 33, 16) as well as the superior left cerebral hemisphere (series 3, image 35). Probable additional acute/early subacute punctate left cerebellar infarct (series 7, image 14). No associated hemorrhage or mass effect. The no evidence for acute intracranial hemorrhage. Single subcentimeter chronic microhemorrhage noted at the right external capsule (series 10, image 16). No mass lesion, midline shift or mass effect. No hydrocephalus. No extra-axial fluid collection. Major dural sinuses patent. No abnormal enhancement. Pituitary gland normal. Right vertebral artery diminutive and not well visualized. Major intracranial vascular flow voids otherwise maintained. Craniocervical junction normal. Upper cervical spine within normal limits. Bone marrow signal intensity normal. No scalp soft tissue abnormality. Globes and orbital soft tissues within normal limits. Mild mucosal thickening within the ethmoidal air cells and maxillary sinuses. Superimposed small left maxillary sinus retention cyst noted. Mastoids are clear. Inner ear structures normal. MRA HEAD FINDINGS ANTERIOR CIRCULATION: Study moderately degraded by motion artifact. Petrous, cavernous, and supraclinoid segments of the internal carotid arteries are patent without flow-limiting stenosis. Origin of the ophthalmic arteries patent bilaterally. ICA termini widely patent. A1 segments patent bilaterally. Anterior  communicating artery not well seen on this motion degraded exam. There is an apparent focal severe stenosis of the mid-distal left A2 segment (series 6, image 116). Possible additional focal moderate stenosis involving the distal right A2 segment (series 6, image 133). M1 segments patent without stenosis or occlusion. MCA bifurcations normal. No obvious proximal M2 occlusion. Possible short-segment moderate to severe proximal left M2 stenosis  noted (series 604, image 13). Distal MCA branches not well evaluated on this motion degraded exam, but are grossly symmetric and well perfused. POSTERIOR CIRCULATION: Left vertebral artery dominant and widely patent to the vertebrobasilar junction. Right vertebral artery diffusely hypoplastic and patent as well without obvious stenosis. Origin of the posterior inferior cerebral arteries not seen on this exam. Basilar artery mildly tortuous but widely patent to its distal aspect without stenosis. Superior cerebral arteries patent proximally. Both of the posterior cerebral arteries primarily supplied via the basilar. PCAs widely patent proximally, not well evaluated distally due to motion. No aneurysm or vascular malformation. MRA NECK FINDINGS Source images reviewed. Visualized aortic arch of normal caliber with normal 3 vessel morphology. No flow-limiting stenosis about the origin of the great vessels. Visualized subclavian arteries widely patent. Right common and internal carotid artery is widely patent within the neck without high-grade or critical stenosis. No significant atheromatous narrowing about the right carotid bifurcation. Left common and internal carotid artery's widely patent within the neck without high-grade or critical stenosis. Mild atheromatous irregularity about the left carotid bifurcation without flow-limiting stenosis. Both vertebral arteries arise from the subclavian arteries. Left vertebral artery dominant with a diffusely hypoplastic right vertebral  artery. Short-segment moderate stenosis at the proximal right P1 segment (series [id number], image 10). Mild atheromatous irregularity within the distal right V2/V3 segments. Pre foraminal left V1 segment tortuous. No other high-grade or flow-limiting stenosis within the vertebral arteries bilaterally. IMPRESSION: MRI HEAD IMPRESSION: 1. Multifocal acute to early subacute ischemic infarcts involving the bilateral cerebral and left cerebellar hemispheres as above, most prominent of which is a 2 cm infarct at the posterior left lentiform nucleus/corona radiata. No associated hemorrhage or mass effect. Possible central thromboembolic etiology suspected given the various vascular distributions involved. 2. Generalized age-related cerebral atrophy with advanced chronic microvascular ischemic disease. MRA HEAD IMPRESSION: 1. Negative intracranial MRA. No large or proximal arterial branch occlusion. No high-grade or correctable stenosis. 2. Question short-segment moderate to severe bilateral A2 and proximal left M2 stenoses, somewhat limited in evaluation due to extensive motion artifact on this exam. MRA NECK IMPRESSION: 1. Widely patent carotid artery systems bilaterally without high-grade or critical flow limiting stenosis. 2. Short-segment moderate proximal right V1 stenosis. Vertebral arteries otherwise widely patent within the neck. Left vertebral artery is dominant with a diffusely hypoplastic right vertebral artery. Electronically Signed   By: Rise Mu M.D.   On: 10/17/2016 15:24    12-lead ECG sinus rhythm (personally reviewed) All prior EKG's in EPIC reviewed with no documented atrial fibrillation  Telemetry sinus rhythm (personally reviewed)  Assessment and Plan:  1. Cryptogenic stroke The patient presents with cryptogenic stroke.  The patient has a TEE planned for this AM. He has been referred for consideration for ILR implant for long term monitoring for atrial fibrillation. The patient  currently has no insurance and has a history of ETOH abuse. I am not sure he would be a candidate for OAC at this time without demonstrated compliance and ETOH cessation. Monitoring with ILR would cause the patient to incur significant costs monthly. Discussed with patient today. Can consider 30 day monitor as an outpatient. If he demonstrates outpatient compliance and ETOH cessation (and is felt to be a candidate for long term anticoagulation if AF is identified) and would like to proceed with ILR implant without insurance coverage, we can consider as an outpatient.   Electrophysiology team to see as needed while here. Please call with questions.  Gypsy Balsam, NP 10/19/2016  7:50 AM  I have seen and examined this patient with Gypsy Balsam.  Agree with above, note added to reflect my findings.  On exam, RRR, no murmurs, lungs clear. Presented to the hospital for cryptogenic stroke. TEE later today. Would qualify for LINQ implant but uninsured. Would consider 30 day monitor as an outpatient. If he demonstrates compliance and ETOH cessation, would be a candidate for LINQ as an outpatient assuming he gets insurance coverage.    Mayleigh Tetrault M. Hetty Linhart MD 10/19/2016 11:15 AM

## 2016-10-20 ENCOUNTER — Inpatient Hospital Stay (HOSPITAL_COMMUNITY): Payer: Medicaid Other

## 2016-10-20 DIAGNOSIS — E7800 Pure hypercholesterolemia, unspecified: Secondary | ICD-10-CM

## 2016-10-20 DIAGNOSIS — I6340 Cerebral infarction due to embolism of unspecified cerebral artery: Principal | ICD-10-CM

## 2016-10-20 DIAGNOSIS — Q211 Atrial septal defect: Secondary | ICD-10-CM

## 2016-10-20 DIAGNOSIS — F329 Major depressive disorder, single episode, unspecified: Secondary | ICD-10-CM

## 2016-10-20 DIAGNOSIS — I639 Cerebral infarction, unspecified: Secondary | ICD-10-CM

## 2016-10-20 DIAGNOSIS — Q2112 Patent foramen ovale: Secondary | ICD-10-CM

## 2016-10-20 LAB — BASIC METABOLIC PANEL
Anion gap: 6 (ref 5–15)
BUN: 20 mg/dL (ref 6–20)
CALCIUM: 8.6 mg/dL — AB (ref 8.9–10.3)
CO2: 22 mmol/L (ref 22–32)
CREATININE: 1.51 mg/dL — AB (ref 0.61–1.24)
Chloride: 112 mmol/L — ABNORMAL HIGH (ref 101–111)
GFR calc non Af Amer: 49 mL/min — ABNORMAL LOW (ref >60)
GFR, EST AFRICAN AMERICAN: 56 mL/min — AB (ref >60)
Glucose, Bld: 101 mg/dL — ABNORMAL HIGH (ref 65–99)
Potassium: 3.7 mmol/L (ref 3.5–5.1)
SODIUM: 140 mmol/L (ref 135–145)

## 2016-10-20 LAB — CBC
HCT: 43.5 % (ref 39.0–52.0)
HEMOGLOBIN: 14.9 g/dL (ref 13.0–17.0)
MCH: 33.5 pg (ref 26.0–34.0)
MCHC: 34.3 g/dL (ref 30.0–36.0)
MCV: 97.8 fL (ref 78.0–100.0)
Platelets: 196 10*3/uL (ref 150–400)
RBC: 4.45 MIL/uL (ref 4.22–5.81)
RDW: 13.1 % (ref 11.5–15.5)
WBC: 7.4 10*3/uL (ref 4.0–10.5)

## 2016-10-20 LAB — CK: CK TOTAL: 85 U/L (ref 49–397)

## 2016-10-20 MED ORDER — LISINOPRIL 10 MG PO TABS
10.0000 mg | ORAL_TABLET | Freq: Every day | ORAL | Status: DC
  Administered 2016-10-20 – 2016-10-23 (×4): 10 mg via ORAL
  Filled 2016-10-20 (×4): qty 1

## 2016-10-20 MED ORDER — AMLODIPINE BESYLATE 5 MG PO TABS
5.0000 mg | ORAL_TABLET | Freq: Every day | ORAL | Status: DC
  Administered 2016-10-20 – 2016-10-21 (×2): 5 mg via ORAL
  Filled 2016-10-20 (×2): qty 1

## 2016-10-20 MED ORDER — TECHNETIUM TC 99M TETROFOSMIN IV KIT
10.0000 | PACK | Freq: Once | INTRAVENOUS | Status: AC | PRN
  Administered 2016-10-20: 10 via INTRAVENOUS

## 2016-10-20 MED ORDER — REGADENOSON 0.4 MG/5ML IV SOLN
INTRAVENOUS | Status: AC
  Filled 2016-10-20: qty 5

## 2016-10-20 MED ORDER — METOPROLOL TARTRATE 25 MG PO TABS
25.0000 mg | ORAL_TABLET | Freq: Two times a day (BID) | ORAL | Status: DC
  Administered 2016-10-20 – 2016-10-21 (×3): 25 mg via ORAL
  Filled 2016-10-20 (×3): qty 1

## 2016-10-20 MED ORDER — REGADENOSON 0.4 MG/5ML IV SOLN
0.4000 mg | Freq: Once | INTRAVENOUS | Status: AC
  Administered 2016-10-20: 0.4 mg via INTRAVENOUS
  Filled 2016-10-20: qty 5

## 2016-10-20 MED ORDER — TECHNETIUM TC 99M TETROFOSMIN IV KIT
30.0000 | PACK | Freq: Once | INTRAVENOUS | Status: AC | PRN
  Administered 2016-10-20: 30 via INTRAVENOUS

## 2016-10-20 NOTE — Progress Notes (Signed)
VASCULAR LAB PRELIMINARY  PRELIMINARY  PRELIMINARY  PRELIMINARY  Bilateral lower extremity venous duplex completed.    Preliminary report:  There is no DVT or SVT noted in the bilateral lower extremities.   Joeanne Robicheaux, RVT 10/20/2016, 12:23 PM

## 2016-10-20 NOTE — Progress Notes (Addendum)
Images of large PFO

## 2016-10-20 NOTE — Progress Notes (Signed)
PROGRESS NOTE    Salman Wellen Kiester  ZOX:[id number] DOB: 05/11/1955 DOA: 10/17/2016 PCP: Patient, No Pcp Per   Chief Complaint  Patient presents with  . Cerebrovascular Accident    Brief Narrative:  HPI on 10/17/2016 by Dr. Richarda Overlie 61 year old male with a history of depression, alcohol abuse, chronic kidney disease, morbid obesity, found on the floor by his father, EMS was called. Patient noted to have right-sided weakness including right upper right lower extremity weakness, according to the patient onset 48 hours ago prior to presentation. Patient also had a headache but no nausea vomiting. Denied any blurry vision, slurred speech, difficulty swallowing Assessment & Plan   Acute CVA -CTA showed no acute hemorrhage or definite acute infarct -MRI head showed multifocal acute to early subacute ischemic infarcts involving bilateral cerebral and left cerebellar hemispheres -MRA head unremarkable -MRA neck widely patent carotid artery systems bilaterally without high-grade critical flow limiting stenosis. -LDL 111, hemglobin A1c 4.7 -Echocardiogram EF 60-65%, grade 1 diastolic dysfunction -Carotid Doppler shows 139% ICA stenosis, antegrade vertebral flow -Continue aspirin and statin -Neurology consulted and appreciated -PT recommended CIR -will also ask SW to look into SNF -TEE showed large PFO -Ordered lower extremity doppler: negative for DVT or SVT -Cardiology recommended closure of the PFO  Elevated troponin -likely secondary to the above -currently chest pain free -Troponins trending downward and flat -heparin drip held given CVA -Cardiology consulted and appreciated: Recommended aspirin and Plavix, hold heparin until the size of infarct is noted. Risk of hemorrhage with anticoagulation in setting of large infarct.  -s/p stress test, EF 43%, no reversible ischemia or infarction, LV dilatation and global hypokinesia. Intermediate risk  Leukocytosis -resolved, likely secondary  to the above -no complaints of shortnes of breath/cough, or urinary issues -CXR and UA unremarkable for infection -Currently afebrile   Hypokalemia -Replaced, continue to monitor BMP  Chronic kidney disease, stage III  -Creatinine appears to be stable, currently 1.51 -continue to monitor BMP  DVT Prophylaxis  Heparin SQ   Code Status: Full  Family Communication: Family at bedside  Disposition Plan: Admitted. Pending PFO closure. Dispo TBD  Consultants Neurology Cardiology  Procedures  Echocardiogram Carotid doppler  Antibiotics   Anti-infectives    None      Subjective:   [name] seen and examined today.  Continues to have right sided paresis.  Denies chest pain, shortness of breath, abdominal pain, N/V/C/D, dizziness, headache.   Objective:   Vitals:   10/20/16 1059 10/20/16 1101 10/20/16 1103 10/20/16 1359  BP: (!) 179/93 (!) 172/96 (!) 176/89 (!) 166/110  Pulse: 89 92 89 97  Resp:    20  Temp:    98.1 F (36.7 C)  TempSrc:    Oral  SpO2:    98%  Weight:      Height:        Intake/Output Summary (Last 24 hours) at 10/20/16 1441 Last data filed at 10/20/16 0931  Gross per 24 hour  Intake          3968.75 ml  Output              680 ml  Net          3288.75 ml   Filed Weights   10/17/16 2132 10/19/16 1413  Weight: 115.5 kg (254 lb 10.1 oz) 115.2 kg (254 lb)   Exam  General: Well developed, well nourished, NAD, appears stated age  HEENT: NCAT,mucous membranes moist.   Cardiovascular: S1 S2 auscultated, no rubs, murmurs  or gallops. Regular rate and rhythm.  Respiratory: Clear to auscultation bilaterally with equal chest rise  Abdomen: Soft, nontender, nondistended, + bowel sounds  Extremities: warm dry without cyanosis clubbing or edema  Neuro: AAOx3, no new neurological deficits, weak on right  Psych: Appropriate  Data Reviewed: I have personally reviewed following labs and imaging studies  CBC:  Recent Labs Lab 10/17/16 0850  10/18/16 0733 10/19/16 0311 10/20/16 0604  WBC 12.3* 9.3 9.2 7.4  NEUTROABS 8.1*  --   --   --   HGB 17.8* 15.9 14.9 14.9  HCT 48.4 45.2 42.7 43.5  MCV 94.2 96.2 95.7 97.8  PLT 257 219 205 196   Basic Metabolic Panel:  Recent Labs Lab 10/17/16 0850 10/18/16 0733 10/19/16 0311 10/20/16 0604  NA 142 143 140 140  K 3.4* 3.5 3.3* 3.7  CL 105 111 110 112*  CO2 GLUCOSE 101* 97 102* 101*  BUN 48* 33* 27* 20  CREATININE 1.80* 1.56* 1.54* 1.51*  CALCIUM 9.2 8.7* 8.3* 8.6*   GFR: Estimated Creatinine Clearance: 67.2 mL/min (A) (by C-G formula based on SCr of 1.51 mg/dL (H)). Liver Function Tests:  Recent Labs Lab 10/17/16 0850  AST 59*  ALT 48  ALKPHOS 72  BILITOT 1.4*  PROT 8.0  ALBUMIN 3.7   No results for input(s): LIPASE, AMYLASE in the last 168 hours. No results for input(s): AMMONIA in the last 168 hours. Coagulation Profile:  Recent Labs Lab 10/17/16 0850  INR 1.25   Cardiac Enzymes:  Recent Labs Lab 10/17/16 1948 10/18/16 1452 10/18/16 2044 10/19/16 0311 10/20/16 0604  CKTOTAL  --   --   --  116 85  TROPONINI 0.21* 0.10* 0.10* 0.11*  --    BNP (last 3 results) No results for input(s): PROBNP in the last 8760 hours. HbA1C:  Recent Labs  10/18/16 1452  HGBA1C 4.7*   CBG:  Recent Labs Lab 10/17/16 0857  GLUCAP 107*   Lipid Profile:  Recent Labs  10/18/16 1452  CHOL 177  HDL 20*  LDLCALC 111*  TRIG 232*  CHOLHDL 8.9   Thyroid Function Tests: No results for input(s): TSH, T4TOTAL, FREET4, T3FREE, THYROIDAB in the last 72 hours. Anemia Panel: No results for input(s): VITAMINB12, FOLATE, FERRITIN, TIBC, IRON, RETICCTPCT in the last 72 hours. Urine analysis:    Component Value Date/Time   COLORURINE YELLOW 10/17/2016 0836   APPEARANCEUR HAZY (A) 10/17/2016 0836   LABSPEC 1.023 10/17/2016 0836   PHURINE 5.0 10/17/2016 0836   GLUCOSEU NEGATIVE 10/17/2016 0836   HGBUR NEGATIVE 10/17/2016 0836   BILIRUBINUR NEGATIVE  10/17/2016 0836   KETONESUR 5 (A) 10/17/2016 0836   PROTEINUR 100 (A) 10/17/2016 0836   UROBILINOGEN 1.0 04/08/2008 2101   NITRITE NEGATIVE 10/17/2016 0836   LEUKOCYTESUR NEGATIVE 10/17/2016 0836   Sepsis Labs: (procalcitonin:4,lacticidven:4)  )No results found for this or any previous visit (from the past 240 hour(s)).    Radiology Studies: Nm Myocar Single W/planar W/Berninger Motion And Ef  Result Date: 10/20/2016 CLINICAL DATA:  61 year old male with chest pain. EXAM: MYOCARDIAL IMAGING WITH SPECT (REST AND PHARMACOLOGIC-STRESS) GATED LEFT VENTRICULAR Miltner MOTION STUDY LEFT VENTRICULAR EJECTION FRACTION TECHNIQUE: Standard myocardial SPECT imaging was performed after resting intravenous injection of 10 mCi Tc-22m tetrofosmin. Subsequently, intravenous infusion of Lexiscan was performed under the supervision of the Cardiology staff. At peak effect of the drug, 30 mCi Tc-34m tetrofosmin was injected intravenously and standard myocardial SPECT imaging was performed. Quantitative gated imaging was  also performed to evaluate left ventricular Ormond motion, and estimate left ventricular ejection fraction. COMPARISON:  Chest radiograph 10/17/2016 FINDINGS: Perfusion: No decreased activity in the left ventricle on stress imaging to suggest reversible ischemia or infarction. Berg Motion: The LEFT ventricle is dilated at rest and stress. Global hypokinesia. No focal Clinkscales motion abnormality Left Ventricular Ejection Fraction: 43 % End diastolic volume 150 ml End systolic volume 86 ml IMPRESSION: 1. No reversible ischemia or infarction. 2. LEFT ventricle dilatation and global hypokinesia. 3. Left ventricular ejection fraction 43% 4. Non invasive risk stratification*: Intermediate (classification due to low ejection fraction) *2012 Appropriate Use Criteria for Coronary Revascularization Focused Update: J Am Coll Cardiol. 2012;59(9):857-881. http://content.dementiazones.com.aspx?articleid=1201161  Electronically Signed   By: Genevive Bi M.D.   On: 10/20/2016 12:35     Scheduled Meds: .  stroke: mapping our early stages of recovery book   Does not apply Once  . amLODipine  5 mg Oral Daily  . aspirin  325 mg Oral Daily  . atorvastatin  40 mg Oral q1800  . heparin  5,000 Units Subcutaneous Q8H  . lisinopril  10 mg Oral Daily  . metoprolol tartrate  25 mg Oral BID  . regadenoson      . senna-docusate  1 tablet Oral BID   Continuous Infusions: . 0.9 % NaCl with KCl 40 mEq / L 75 mL/hr (10/20/16 0336)     LOS: 3 days   Time Spent in minutes   30 minutes  Yomaira Solar D.O. on 10/20/2016 at 2:41 PM  Between 7am to 7pm - Pager - [phone]  After 7pm go to [email or web address] - password [email or web address]  And look for the night coverage person covering for me after hours  [facility] Office  [phone]

## 2016-10-20 NOTE — Progress Notes (Signed)
STROKE TEAM PROGRESS NOTE   SUBJECTIVE (INTERVAL HISTORY) Wife and daughter are at the bedside.  The pt is lying in bed. TEE with Dr. Jacinto Halim showed moderate to large PFO. LE venous doppler negative for DVT. Discussed with Dr. Jacinto Halim over the phone and agree with PFO closure next week.    OBJECTIVE Temp:  [97.5 F (36.4 C)-98.5 F (36.9 C)] 97.9 F (36.6 C) (09/22 0100) Pulse Rate:  [65-106] 81 (09/22 0100) Cardiac Rhythm: Normal sinus rhythm (09/22 0700) Resp:  [12-21] 20 (09/22 0100) BP: (155-219)/(87-138) 165/101 (09/22 0100) SpO2:  [94 %-100 %] 97 % (09/22 0100) Weight:  [254 lb (115.2 kg)] 254 lb (115.2 kg) (09/21 1413)  CBC:   Recent Labs Lab 10/17/16 0850  10/19/16 0311 10/20/16 0604  WBC 12.3*  < > 9.2 7.4  NEUTROABS 8.1*  --   --   --   HGB 17.8*  < > 14.9 14.9  HCT 48.4  < > 42.7 43.5  MCV 94.2  < > 95.7 97.8  PLT 257  < > 205 196  < > = values in this interval not displayed.  Basic Metabolic Panel:   Recent Labs Lab 10/19/16 0311 10/20/16 0604  NA 140 140  K 3.3* 3.7  CL 110 112*  CO2 24 22  GLUCOSE 102* 101*  BUN 27* 20  CREATININE 1.54* 1.51*  CALCIUM 8.3* 8.6*    Lipid Panel:     Component Value Date/Time   CHOL 177 10/18/2016 1452   TRIG 232 (H) 10/18/2016 1452   HDL 20 (L) 10/18/2016 1452   CHOLHDL 8.9 10/18/2016 1452   VLDL 46 (H) 10/18/2016 1452   LDLCALC 111 (H) 10/18/2016 1452   HgbA1c:  Lab Results  Component Value Date   HGBA1C 4.7 (L) 10/18/2016   Urine Drug Screen:     Component Value Date/Time   LABOPIA NONE DETECTED 10/17/2016 0836   COCAINSCRNUR NONE DETECTED 10/17/2016 0836   LABBENZ NONE DETECTED 10/17/2016 0836   AMPHETMU NONE DETECTED 10/17/2016 0836   THCU NONE DETECTED 10/17/2016 0836   LABBARB NONE DETECTED 10/17/2016 0836    Alcohol Level     Component Value Date/Time   ETH <5 10/17/2016 0850    IMAGING I have personally reviewed the radiological images below and agree with the radiology  interpretations.  Dg Chest 2 View 10/17/2016 IMPRESSION: No active cardiopulmonary disease.  Ct Head Wo Contrast 10/17/2016 IMPRESSION: 1. No acute hemorrhage or definite acute infarct. 2. Advanced chronic small vessel ischemia which could easily obscure an acute white matter or deep gray nucleus infarct. 3. Small remote appearing right frontal cortex infarct.   Mr Laqueta Jean Wo Contrast 10/17/2016 MRI HEAD IMPRESSION: 1. Multifocal acute to early subacute ischemic infarcts involving the bilateral cerebral and left cerebellar hemispheres as above, most prominent of which is a 2 cm infarct at the posterior left lentiform nucleus/corona radiata. No associated hemorrhage or mass effect. Possible central thromboembolic etiology suspected given the various vascular distributions involved. 2. Generalized age-related cerebral atrophy with advanced chronic microvascular ischemic disease.   Mr Maxine Glenn Head Wo Contrast 10/17/2016 MRA HEAD IMPRESSION: 1. Negative intracranial MRA. No large or proximal arterial branch occlusion. No high-grade or correctable stenosis. 2. Question short-segment moderate to severe bilateral A2 and proximal left M2 stenoses, somewhat limited in evaluation due to extensive motion artifact on this exam.   Mr Angiogram Neck W Or Wo Contrast 10/17/2016 MRA NECK IMPRESSION: 1. Widely patent carotid artery systems bilaterally without high-grade or critical flow  limiting stenosis. 2. Short-segment moderate proximal right V1 stenosis. Vertebral arteries otherwise widely patent within the neck. Left vertebral artery is dominant with a diffusely hypoplastic right vertebral artery.   Carotid US 10/18/2016 Preliminary findings: Bilateral 1-39% ICA stenosis, antegrade vertebral flow.  TTE 10/18/2016 Left ventricle: The cavity size was normal. There was severe   concentric hypertrophy. Systolic function was normal. The   estimated ejection fraction was in the range of 60% to 65%. Fryer   motion  was normal; there were no regional Laubach motion   abnormalities  TEE (Dr. Jacinto Halim) 10/19/2016 LV: Normal. Normal EF.  Moderate LVH RV: Normal LA: Normal. Left atrial appendage: Normal without thrombus. Normal function. Inter atrial septum shows a moderate sized PFO with moderately positive right to left shunting with double contrast injection at rest and very strongly positive shunting with strain. Eusthesian valve noted RA: Normal MV: Redundant MV chordae with prolapse of the chordae into the LVOT without LVOT obstruction. Trace MVP with trace MR. No myxomatous degeneration noted TV: Normal Trace TR AV: Normal. Mild AI or AS. PV: Normal. Trace PI. Thoracic and ascending aorta: Normal without significant plaque or atheromatous changes.  LE venous doppler - no DVT or SVT   PHYSICAL EXAM Pleasant middle aged male not in distress. . Afebrile. Head is nontraumatic. Neck is supple without bruit.    Cardiac exam no murmur or gallop. Lungs are clear to auscultation. Distal pulses are well felt.  Neurological Exam :  Awake alert oriented 3. Mild dysarthria. Follows commands well. Extraocular moments are full range without nystagmus. Right upper eyelid hordeolum. Face is asymmetric with right lower facial weakness. Tongue is midline. Motor system exam right hemiplegia with 1/5 right-sided strength. Good normal left-sided strength. Slight diminished sensation on the right. Coordination cannot be tested on the right normal on the left. Gait not tested.   ASSESSMENT/PLAN Mr. Breon Rehm Beske is a 61 y.o. male with history of  hypertension, hyperlipidemia, depression, alcohol abuse, chronic kidney disease and obesity, who presented via EMS to [facility] with right-sided weakness. He did not receive IV t-PA due to arriving outside of the tPA treatment window.   Stroke: Multifocal acute/early subacute bilateral cerebral and left cerebellar ischemic infarcts, embolic, etiology unclear. Pt does have multiple  stroke risk factors including HTN, HLD, obesity, alcohol abuse, moderate to large PFO.  Resultant  right hemiplegia and facial weakness  CT head: no acute stroke.  Advanced small vessel disease.  MRI head: Multifocal acute/early subacute bilateral cerebral and left cerebellar ischemic infarcts  MRA head: no LVO or high-grade stenosis  Carotid Doppler unremarkable  2D Echo: Left ventricle EF 60-65%  TEE: moderate to large PFO  LE venous doppler no DVT  Cardiology recommend 30 day cardiac event monitoring as outpt  LDL  111  HgbA1c 4.7   Heparin 5,000 units Edwards Q8H for VTE prophylaxis Diet Heart Room service appropriate? Yes; Fluid consistency: Thin  No antithrombotic prior to admission, now on aspirin 325 mg daily.  Patient counseled to be compliant with his antithrombotic medications  Ongoing aggressive stroke risk factor management  Therapy recommendations: CIR  Disposition: pending  PFO  Moderate to large PFO confirmed on TEE  Discussed with Dr. Jacinto Halim. Although pt has multiple other risk factors, Dr. Jacinto Halim concerns this is significant risk for recurrent stroke  Agree with PFO closure procedure next week  Hypertension  Stable  Permissive hypertension (OK if < 220/120) but gradually normalize in 5-7 days  Long-term BP goal normotensive  Hyperlipidemia  Home meds:  none  LDL  111, goal < 70  Add atorvastatin 40 mg PO daily  Continue statin at discharge  Other Stroke Risk Factors  ETOH use, advised to drink no more than 2 drink(s) a day  Obesity, Body mass index is 35.43 kg/m., recommend weight loss, diet and exercise as appropriate   Other Active Problems  CKD, Stage 3: Scr 1.56->1.54 (chronic baseline), GFR 47  Downward deflecting ST segment on EKG: troponin negative x 3 Likely chronic CAD.   Hospital day # 3  Neurology will sign off. Please call with questions. Pt will follow up with Dr. Pearlean Brownie at [facility] in about 6 weeks. Thanks for the  consult.  Marvel Plan, MD PhD Stroke Neurology 10/20/2016 8:52 PM   To contact Stroke Continuity provider, please refer to [email or web address]. After hours, contact General Neurology

## 2016-10-20 NOTE — Progress Notes (Signed)
Subjective:  No specific complaints today. Continues to have dense right hemiplegia.  Objective:  Vital Signs in the last 24 hours: Temp:  [97.5 F (36.4 C)-98.5 F (36.9 C)] 97.9 F (36.6 C) (09/22 0100) Pulse Rate:  [65-106] 81 (09/22 0100) Resp:  [12-21] 20 (09/22 0100) BP: (155-219)/(87-138) 165/101 (09/22 0100) SpO2:  [94 %-100 %] 97 % (09/22 0100) Weight:  [115.2 kg (254 lb)] 115.2 kg (254 lb) (09/21 1413)  Intake/Output from previous day: 09/21 0701 - 09/22 0700 In: 3968.8 [P.O.:120; I.V.:3848.8] Out: 1130 [Urine:1130]  Physical Exam:  Blood pressure (!) 165/101, pulse 81, temperature 97.9 F (36.6 C), temperature source Oral, resp. rate 20, height  (1.803 m), weight 115.2 kg (254 lb), SpO2 97 %.  General appearance: alert, cooperative, no distress and mildly obese Eyes: negative findings: lids and lashes normal Neck: no adenopathy, no carotid bruit, no JVD, supple, symmetrical, trachea midline and thyroid not enlarged, symmetric, no tenderness/mass/nodules Neck: JVP - normal, carotids 2+= without bruits Resp: clear to auscultation bilaterally Chest Heid: no tenderness Cardio: regular rate and rhythm, S1, S2 normal, no murmur, click, rub or gallop GI: soft, non-tender; bowel sounds normal; no masses,  no organomegaly Extremities: extremities normal, atraumatic, no cyanosis or edema and Neurologic: Dense right hemiplegia noted.  Vascular: ll the peripheral pulses are normal, Bilateral DP is faint. Probably normal variant. Lab Results: BMP  Recent Labs  10/18/16 0733 10/19/16 0311 10/20/16 0604  NA 143 140 140  K 3.5 3.3* 3.7  CL 111 110 112*  CO2 GLUCOSE 97 102* 101*  BUN 33* 27* 20  CREATININE 1.56* 1.54* 1.51*  CALCIUM 8.7* 8.3* 8.6*  GFRNONAA 47* 47* 49*  GFRAA 54* 55* 56*   CBC  Recent Labs Lab 10/17/16 0850  10/20/16 0604  WBC 12.3*  < > 7.4  RBC 5.14  < > 4.45  HGB 17.8*  < > 14.9  HCT 48.4  < > 43.5  PLT 257  < > 196  MCV  94.2  < > 97.8  MCH 34.6*  < > 33.5  MCHC 36.8*  < > 34.3  RDW 12.5  < > 13.1  LYMPHSABS 2.4  --   --   MONOABS 1.0  --   --   EOSABS 0.8*  --   --   BASOSABS 0.1  --   --   < > = values in this interval not displayed.  HEMOGLOBIN A1C Lab Results  Component Value Date   HGBA1C 4.7 (L) 10/18/2016   MPG 88.19 10/18/2016    Cardiac Panel (last 3 results)  Recent Labs  10/18/16 1452 10/18/16 2044 10/19/16 0311 10/20/16 0604  CKTOTAL  --   --  116 85  TROPONINI 0.10* 0.10* 0.11*  --   Lipid Panel     Component Value Date/Time   CHOL 177 10/18/2016 1452   TRIG 232 (H) 10/18/2016 1452   HDL 20 (L) 10/18/2016 1452   CHOLHDL 8.9 10/18/2016 1452   VLDL 46 (H) 10/18/2016 1452   LDLCALC 111 (H) 10/18/2016 1452   Hepatic Function Panel  Recent Labs  10/17/16 0850  PROT 8.0  ALBUMIN 3.7  AST 59*  ALT 48  ALKPHOS 72  BILITOT 1.4*   Ct Head Wo Contrast 10/17/2016 IMPRESSION: 1. No acute hemorrhage or definite acute infarct. 2. Advanced chronic small vessel ischemia which could easily obscure an acute white matter or deep gray nucleus infarct. 3. Small remote appearing right frontal cortex infarct.  Mr Laqueta Jean Wo Contrast 10/17/2016 MRI HEAD IMPRESSION: 1. Multifocal acute to early subacute ischemic infarcts involving the bilateral cerebral and left cerebellar hemispheres as above, most prominent of which is a 2 cm infarct at the posterior left lentiform nucleus/corona radiata. No associated hemorrhage or mass effect. Possible central thromboembolic etiology suspected given the various vascular distributions involved. 2. Generalized age-related cerebral atrophy with advanced chronic microvascular ischemic disease.   Mr Maxine Glenn Head Wo Contrast 10/17/2016 MRA HEAD IMPRESSION: 1. Negative intracranial MRA. No large or proximal arterial branch occlusion. No high-grade or correctable stenosis. 2. Question short-segment moderate to severe bilateral A2 and proximal left M2 stenoses,  somewhat limited in evaluation due to extensive motion artifact on this exam.   Cardiac Studies: EKG 10/17/2016: Sinus rhythm 79 bpm.  Borderline left axis deviation.  LVH.  ST T changes likely secondary to LVH.  Echocardiogram  10/18/2016 Left ventricle: The cavity size was normal. There was severe   concentric hypertrophy. Systolic function was normal. The   estimated ejection fraction was in the range of 60% to 65%. Schack   motion was normal; there were no regional [place] motion   abnormalities. Doppler parameters are consistent with abnormal   left ventricular relaxation (grade 1 diastolic dysfunction). - Left atrium: The atrium was mildly dilated. - Atrial septum: No defect or patent foramen ovale was identified.  TEE 10/19/2016: Severe LVH, normal LVEF. No significant valvular abnormality.there is mild redundancy of the mitral valve chordae, borderline minimal prolapse without significant myxomatous degeneration of the mitral valve, mild mitral regurgitation. Trace to mild aortic regurgitation.  A very large PFO with very strongly positive right to left shunting noted both at rest and with strain Normal aorta.  Scheduled Meds: .  stroke: mapping our early stages of recovery book   Does not apply Once  . aspirin  325 mg Oral Daily  . atorvastatin  40 mg Oral q1800  . heparin  5,000 Units Subcutaneous Q8H  . senna-docusate  1 tablet Oral BID   Continuous Infusions: . 0.9 % NaCl with KCl 40 mEq / L 75 mL/hr (10/20/16 0336)   PRN Meds:.acetaminophen **OR** acetaminophen (TYLENOL) oral liquid 160 mg/5 mL **OR** acetaminophen, ALPRAZolam, hydrALAZINE, ondansetron (ZOFRAN) IV   Assessment/Plan:  1. Cardio-embolic stroke 2. Very large PFO with strongly positive right to left shunting by double contrast injection. 3. Hypertension with hypertensive heart disease with severe LVH. 4. 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 5. Abnormal EKG due to  hypertensive heart disease suggestive of LVH with strain, cannot suspect ischemia.  Recommendation: Patient will probably need cardiac ischemic workup at some point. Given a very large PFO with right-to-left shunting, imaging studies suggestive of cardioembolic stroke, he will probably benefit from closure of the PFO for secondary prevention of stroke. Will set him up for Lexiscan Myoview stress test on Monday. For hypertension, I will start the patient on beta blocker, could also try low-dose of ARB or ACE inhibitor and closely monitor the serum creatinine. We'll also add calcium channel blocker, amlodipine. Avoid hypotension in view of recent stroke. However there is no significant vascular stenosis hence watershed infarct is less likely.   Yates Decamp, M.D. 10/20/2016, 8:41 AM [facility] Pager: [phone] Office: [phone] If no answer: [phone]

## 2016-10-21 ENCOUNTER — Encounter (HOSPITAL_COMMUNITY): Payer: Self-pay | Admitting: Cardiology

## 2016-10-21 DIAGNOSIS — E7800 Pure hypercholesterolemia, unspecified: Secondary | ICD-10-CM

## 2016-10-21 DIAGNOSIS — Q211 Atrial septal defect: Secondary | ICD-10-CM

## 2016-10-21 LAB — BASIC METABOLIC PANEL
ANION GAP: 8 (ref 5–15)
BUN: 20 mg/dL (ref 6–20)
CALCIUM: 8.9 mg/dL (ref 8.9–10.3)
CO2: 22 mmol/L (ref 22–32)
Chloride: 109 mmol/L (ref 101–111)
Creatinine, Ser: 1.56 mg/dL — ABNORMAL HIGH (ref 0.61–1.24)
GFR calc Af Amer: 54 mL/min — ABNORMAL LOW (ref >60)
GFR, EST NON AFRICAN AMERICAN: 47 mL/min — AB (ref >60)
Glucose, Bld: 85 mg/dL (ref 65–99)
POTASSIUM: 3.9 mmol/L (ref 3.5–5.1)
SODIUM: 139 mmol/L (ref 135–145)

## 2016-10-21 LAB — CBC
HCT: 48.4 % (ref 39.0–52.0)
Hemoglobin: 16.6 g/dL (ref 13.0–17.0)
MCH: 33.7 pg (ref 26.0–34.0)
MCHC: 34.3 g/dL (ref 30.0–36.0)
MCV: 98.2 fL (ref 78.0–100.0)
Platelets: 199 10*3/uL (ref 150–400)
RBC: 4.93 MIL/uL (ref 4.22–5.81)
RDW: 13 % (ref 11.5–15.5)
WBC: 9.4 10*3/uL (ref 4.0–10.5)

## 2016-10-21 MED ORDER — HYDRALAZINE HCL 25 MG PO TABS
25.0000 mg | ORAL_TABLET | Freq: Three times a day (TID) | ORAL | Status: DC
  Administered 2016-10-21 – 2016-10-26 (×14): 25 mg via ORAL
  Filled 2016-10-21 (×14): qty 1

## 2016-10-21 MED ORDER — AMLODIPINE BESYLATE 10 MG PO TABS
10.0000 mg | ORAL_TABLET | Freq: Every day | ORAL | Status: DC
  Administered 2016-10-21 – 2016-10-26 (×6): 10 mg via ORAL
  Filled 2016-10-21 (×6): qty 1

## 2016-10-21 MED ORDER — METOPROLOL TARTRATE 50 MG PO TABS
50.0000 mg | ORAL_TABLET | Freq: Two times a day (BID) | ORAL | Status: DC
  Administered 2016-10-21 – 2016-10-26 (×11): 50 mg via ORAL
  Filled 2016-10-21 (×11): qty 1

## 2016-10-21 NOTE — Progress Notes (Signed)
PROGRESS NOTE    Brandon [name]  ZOX:[id number] DOB: 1955-04-25 DOA: 10/17/2016 PCP: Patient, No Pcp Per   Chief Complaint  Patient presents with  . Cerebrovascular Accident    Brief Narrative:  HPI on 10/17/2016 by Dr. Richarda Overlie 62 year old male with a history of depression, alcohol abuse, chronic kidney disease, morbid obesity, found on the floor by his father, EMS was called. Patient noted to have right-sided weakness including right upper right lower extremity weakness, according to the patient onset 48 hours ago prior to presentation. Patient also had a headache but no nausea vomiting. Denied any blurry vision, slurred speech, difficulty swallowing.  Assessment & Plan   Acute CVA -CTA showed no acute hemorrhage or definite acute infarct -MRI head showed multifocal acute to early subacute ischemic infarcts involving bilateral cerebral and left cerebellar hemispheres -MRA head unremarkable -MRA neck widely patent carotid artery systems bilaterally without high-grade critical flow limiting stenosis. -LDL 111, hemglobin A1c 4.7 -Echocardiogram EF 60-65%, grade 1 diastolic dysfunction -Carotid Doppler shows 139% ICA stenosis, antegrade vertebral flow -Continue aspirin and statin -Neurology consulted and appreciated -PT recommended CIR -will also ask SW to look into SNF -TEE showed large PFO -Ordered lower extremity doppler: negative for DVT or SVT -Cardiology recommended closure of the PFO- likely to occur on 10/23/16 (Discussed with Dr. Jacinto Halim)  Elevated troponin -likely secondary to the above -currently chest pain free -Troponins trending downward and flat -heparin drip held given CVA -Cardiology consulted and appreciated: Recommended aspirin and Plavix, hold heparin until the size of infarct is noted. Risk of hemorrhage with anticoagulation in setting of large infarct.  -s/p stress test, EF 43%, no reversible ischemia or infarction, LV dilatation and global hypokinesia.  Intermediate risk  Leukocytosis -resolved, likely secondary to the above -no complaints of shortnes of breath/cough, or urinary issues -CXR and UA unremarkable for infection -Currently afebrile   Hypokalemia -Replaced, continue to monitor BMP  Chronic kidney disease, stage III  -Creatinine appears to be stable, currently 1.56 -continue to monitor BMP  DVT Prophylaxis  Heparin SQ   Code Status: Full  Family Communication: Family at bedside  Disposition Plan: Admitted. Pending PFO closure on 10/23/2016. Dispo TBD  Consultants Neurology Cardiology  Procedures  Echocardiogram Carotid doppler  Antibiotics   Anti-infectives    None      Subjective:   Brandon [name] seen and examined today.  Continues to have right sided weakness. Currently sitting up in a chair. Denies chest pain, shortness of breath, abdominal pain, nausea, vomiting, diarrhea, constipation, headache, dizziness.   Objective:   Vitals:   10/20/16 2108 10/21/16 0120 10/21/16 0522 10/21/16 0938  BP: (!) 143/72 (!) 150/79 (!) 154/76 (!) 146/91  Pulse: 67 (!) 56 (!) 54 79  Resp: Temp: 97.8 F (36.6 C) 98.4 F (36.9 C) 97.7 F (36.5 C) 98.8 F (37.1 C)  TempSrc: Oral Oral Oral Oral  SpO2: 97% 98% 100% 94%  Weight:      Height:        Intake/Output Summary (Last 24 hours) at 10/21/16 1059 Last data filed at 10/21/16 1010  Gross per 24 hour  Intake              360 ml  Output             1300 ml  Net             -940 ml   Filed Weights   10/17/16 2132 10/19/16 1413  Weight:  115.5 kg (254 lb 10.1 oz) 115.2 kg (254 lb)   Exam  General: Well developed, well nourished, NAD  HEENT: NCAT, mucous membranes moist.   Cardiovascular: S1 S2 auscultated, RRR, no murmurs  Respiratory: Clear to auscultation bilaterally  Abdomen: Soft, nontender, nondistended, + bowel sounds  Extremities: warm dry without cyanosis clubbing or edema  Neuro: AAOx3, nonfocal- no new findings   Psych:  Appropriate mood and affect, pleasant  Data Reviewed: I have personally reviewed following labs and imaging studies  CBC:  Recent Labs Lab 10/17/16 0850 10/18/16 0733 10/19/16 0311 10/20/16 0604 10/21/16 0523  WBC 12.3* 9.3 9.2 7.4 9.4  NEUTROABS 8.1*  --   --   --   --   HGB 17.8* 15.9 14.9 14.9 16.6  HCT 48.4 45.2 42.7 43.5 48.4  MCV 94.2 96.2 95.7 97.8 98.2  PLT 257 219 205 196 199   Basic Metabolic Panel:  Recent Labs Lab 10/17/16 0850 10/18/16 0733 10/19/16 0311 10/20/16 0604 10/21/16 0523  NA 142 143 140 140 139  K 3.4* 3.5 3.3* 3.7 3.9  CL 105 111 110 112* 109  CO2 GLUCOSE 101* 97 102* 101* 85  BUN 48* 33* 27* 20 20  CREATININE 1.80* 1.56* 1.54* 1.51* 1.56*  CALCIUM 9.2 8.7* 8.3* 8.6* 8.9   GFR: Estimated Creatinine Clearance: 65 mL/min (A) (by C-G formula based on SCr of 1.56 mg/dL (H)). Liver Function Tests:  Recent Labs Lab 10/17/16 0850  AST 59*  ALT 48  ALKPHOS 72  BILITOT 1.4*  PROT 8.0  ALBUMIN 3.7   No results for input(s): LIPASE, AMYLASE in the last 168 hours. No results for input(s): AMMONIA in the last 168 hours. Coagulation Profile:  Recent Labs Lab 10/17/16 0850  INR 1.25   Cardiac Enzymes:  Recent Labs Lab 10/17/16 1948 10/18/16 1452 10/18/16 2044 10/19/16 0311 10/20/16 0604  CKTOTAL  --   --   --  116 85  TROPONINI 0.21* 0.10* 0.10* 0.11*  --    BNP (last 3 results) No results for input(s): PROBNP in the last 8760 hours. HbA1C:  Recent Labs  10/18/16 1452  HGBA1C 4.7*   CBG:  Recent Labs Lab 10/17/16 0857  GLUCAP 107*   Lipid Profile:  Recent Labs  10/18/16 1452  CHOL 177  HDL 20*  LDLCALC 111*  TRIG 232*  CHOLHDL 8.9   Thyroid Function Tests: No results for input(s): TSH, T4TOTAL, FREET4, T3FREE, THYROIDAB in the last 72 hours. Anemia Panel: No results for input(s): VITAMINB12, FOLATE, FERRITIN, TIBC, IRON, RETICCTPCT in the last 72 hours. Urine analysis:    Component  Value Date/Time   COLORURINE YELLOW 10/17/2016 0836   APPEARANCEUR HAZY (A) 10/17/2016 0836   LABSPEC 1.023 10/17/2016 0836   PHURINE 5.0 10/17/2016 0836   GLUCOSEU NEGATIVE 10/17/2016 0836   HGBUR NEGATIVE 10/17/2016 0836   BILIRUBINUR NEGATIVE 10/17/2016 0836   KETONESUR 5 (A) 10/17/2016 0836   PROTEINUR 100 (A) 10/17/2016 0836   UROBILINOGEN 1.0 04/08/2008 2101   NITRITE NEGATIVE 10/17/2016 0836   LEUKOCYTESUR NEGATIVE 10/17/2016 0836   Sepsis Labs: (procalcitonin:4,lacticidven:4)  )No results found for this or any previous visit (from the past 240 hour(s)).    Radiology Studies: Nm Myocar Single W/planar W/[employer] Motion And Ef  Result Date: 10/20/2016 CLINICAL DATA:  61 year old male with chest pain. EXAM: MYOCARDIAL IMAGING WITH SPECT (REST AND PHARMACOLOGIC-STRESS) GATED LEFT VENTRICULAR [place] MOTION STUDY LEFT VENTRICULAR EJECTION FRACTION TECHNIQUE: Standard myocardial SPECT imaging was performed  after resting intravenous injection of 10 mCi Tc-81m tetrofosmin. Subsequently, intravenous infusion of Lexiscan was performed under the supervision of the Cardiology staff. At peak effect of the drug, 30 mCi Tc-59m tetrofosmin was injected intravenously and standard myocardial SPECT imaging was performed. Quantitative gated imaging was also performed to evaluate left ventricular Previti motion, and estimate left ventricular ejection fraction. COMPARISON:  Chest radiograph 10/17/2016 FINDINGS: Perfusion: No decreased activity in the left ventricle on stress imaging to suggest reversible ischemia or infarction. [place]eter Motion: The LEFT ventricle is dilated at rest and stress. Global hypokinesia. No focal [place] motion abnormality Left Ventricular Ejection Fraction: 43 % End diastolic volume 150 ml End systolic volume 86 ml IMPRESSION: 1. No reversible ischemia or infarction. 2. LEFT ventricle dilatation and global hypokinesia. 3. Left ventricular ejection fraction 43% 4. Non invasive risk  stratification*: Intermediate (classification due to low ejection fraction) *2012 Appropriate Use Criteria for Coronary Revascularization Focused Update: J Am Coll Cardiol. 2012;59(9):857-881. http://content.dementiazones.com.aspx?articleid=[email or web address] Electronically Signed   By: Genevive Bi M.D.   On: 10/20/2016 12:35     Scheduled Meds: .  stroke: mapping our early stages of recovery book   Does not apply Once  . amLODipine  10 mg Oral Daily  . aspirin  325 mg Oral Daily  . atorvastatin  40 mg Oral q1800  . heparin  5,000 Units Subcutaneous Q8H  . hydrALAZINE  25 mg Oral Q8H  . lisinopril  10 mg Oral Daily  . metoprolol tartrate  50 mg Oral BID  . senna-docusate  1 tablet Oral BID   Continuous Infusions: . 0.9 % NaCl with KCl 40 mEq / L 75 mL/hr (10/20/16 0336)     LOS: 4 days   Time Spent in minutes   30 minutes  Prudencio Velazco D.O. on 10/21/2016 at 10:59 AM  Between 7am to 7pm - Pager - [phone]  After 7pm go to [email or web address] - password [email or web address]  And look for the night coverage person covering for me after hours  [facility] Office  [phone]

## 2016-10-21 NOTE — Progress Notes (Signed)
Occupational Therapy Treatment Patient Details Name: Brandon [name] MRN: [id number] DOB: 08-25-1955 Today's Date: 10/21/2016    History of present illness 61 year old male with a history of depression, alcohol abuse, chronic kidney disease, morbid obesity, found on the floor by his father. Imaging revealed Multifocal acute to early subacute ischemic infarcts involving he bilateral cerebral and left cerebellar hemispheres.   OT comments  Pt. Does not have any trace muscle contraction noted on r ue. Pt. Was educated on performing SROM for R ue. Pt. requried min a to perform correctly. Pt. Had just gotten back to bed and declined EOB sitting. Pt. Was able to wash face at S level and was Min A to wash B hands. Pt. Was Max a with doffing and donning of gown. Pt. Was able to state year but not month or day. Continue with skilled OT services.   Follow Up Recommendations       Equipment Recommendations       Recommendations for Other Services      Precautions / Restrictions Precautions Precautions: Fall Precaution Comments: right sided weakness       Mobility Bed Mobility                  Transfers                      Balance                                           ADL either performed or assessed with clinical judgement   ADL       Grooming: Wash/dry hands;Wash/dry face;Minimal assistance           Upper Body Dressing : Maximal assistance;Bed level                     General ADL Comments: Pt. had just gotten back to bed and deferred EOB activity.      Vision       Perception     Praxis      Cognition Arousal/Alertness: Awake/alert Behavior During Therapy: Flat affect (a tad flat) Overall Cognitive Status: Impaired/Different from baseline Area of Impairment: Following commands;Memory;Orientation;Attention;Awareness                 Orientation Level: Time Current Attention Level: Sustained Memory:  Decreased short-term memory Following Commands: Follows one step commands consistently                Exercises Exercises: General Upper Extremity General Exercises - Upper Extremity Shoulder Flexion: PROM;Self ROM;Right;20 reps;Supine Shoulder ABduction: PROM;Right;20 reps;Supine Elbow Flexion: PROM;Self ROM;Right;20 reps;Supine Elbow Extension: PROM;Self ROM;Right;20 reps;Supine Wrist Flexion: PROM;Self ROM;Right;20 reps Wrist Extension: PROM;Self ROM;Right;20 reps Digit Composite Flexion: PROM;Self ROM;Right;20 reps Composite Extension: PROM;Right;15 reps;Supine   Shoulder Instructions       General Comments      Pertinent Vitals/ Pain       Pain Assessment: No/denies pain  Home Living                                          Prior Functioning/Environment              Frequency  Min 3X/week        Progress Toward Goals  OT  Goals(current goals can now be found in the care plan section)  Progress towards OT goals: Progressing toward goals  Acute Rehab OT Goals Patient Stated Goal: to get better  Plan Discharge plan remains appropriate    Co-evaluation                 AM-PAC PT "6 Clicks" Daily Activity     Outcome Measure   Help from another person eating meals?: A Little Help from another person taking care of personal grooming?: A Lot Help from another person toileting, which includes using toliet, bedpan, or urinal?: Total Help from another person bathing (including washing, rinsing, drying)?: A Lot Help from another person to put on and taking off regular upper body clothing?: A Lot Help from another person to put on and taking off regular lower body clothing?: Total 6 Click Score: 11    End of Session    OT Visit Diagnosis: Unsteadiness on feet (R26.81);Other abnormalities of gait and mobility (R26.89);Hemiplegia and hemiparesis Hemiplegia - Right/Left: Right Hemiplegia - dominant/non-dominant:  Dominant Hemiplegia - caused by: Cerebral infarction   Activity Tolerance Patient tolerated treatment well   Patient Left in bed;with call bell/phone within reach;with bed alarm set   Nurse Communication          Time: 9604-5409 OT Time Calculation (min): 38 min  Charges: OT General Charges $OT Visit: 1 Visit OT Treatments $Self Care/Home Management : 8-22 mins $Neuromuscular Re-education: 23-37 mins  6 clicks   Brandon [name] 10/21/2016, 2:33 PM

## 2016-10-21 NOTE — Progress Notes (Addendum)
Subjective:  No specific complaints today. Continues to have dense right hemiplegia. Didi well getting up with support. No headache, visual disturbances.   Objective:  Vital Signs in the last 24 hours: Temp:  [97.7 F (36.5 C)-98.8 F (37.1 C)] 98.8 F (37.1 C) (09/23 0938) Pulse Rate:  [54-97] 79 (09/23 0938) Resp:  [20] 20 (09/23 0938) BP: (143-179)/(72-110) 146/91 (09/23 0938) SpO2:  [94 %-100 %] 94 % (09/23 0938)  Intake/Output from previous day: 09/22 0701 - 09/23 0700 In: -  Out: 600 [Urine:600]  Physical Exam:  Blood pressure (!) 165/101, pulse 81, temperature 97.9 F (36.6 C), temperature source Oral, resp. rate 20, height  (1.803 m), weight 115.2 kg (254 lb), SpO2 97 %.  General appearance: alert, cooperative, no distress and mildly obese Eyes: negative findings: lids and lashes normal Neck: no adenopathy, no carotid bruit, no JVD, supple, symmetrical, trachea midline and thyroid not enlarged, symmetric, no tenderness/mass/nodules Neck: JVP - normal, carotids 2+= without bruits Resp: clear to auscultation bilaterally Chest [place]: no tenderness Cardio: regular rate and rhythm, S1, S2 normal, no murmur, click, rub or gallop GI: soft, non-tender; bowel sounds normal; no masses,  no organomegaly Extremities: extremities normal, atraumatic, no cyanosis or edema and Neurologic: Dense right hemiplegia noted.  Vascular: ll the peripheral pulses are normal, Bilateral DP is faint. Probably normal variant. Lab Results: BMP  Recent Labs  10/19/16 0311 10/20/16 0604 10/21/16 0523  NA 140 140 139  K 3.3* 3.7 3.9  CL 110 112* 109  CO2 GLUCOSE 102* 101* 85  BUN 27* 20 20  CREATININE 1.54* 1.51* 1.56*  CALCIUM 8.3* 8.6* 8.9  GFRNONAA 47* 49* 47*  GFRAA 55* 56* 54*   CBC  Recent Labs Lab 10/17/16 0850  10/21/16 0523  WBC 12.3*  < > 9.4  RBC 5.14  < > 4.93  HGB 17.8*  < > 16.6  HCT 48.4  < > 48.4  PLT 257  < > 199  MCV 94.2  < > 98.2  MCH 34.6*  <  > 33.7  MCHC 36.8*  < > 34.3  RDW 12.5  < > 13.0  LYMPHSABS 2.4  --   --   MONOABS 1.0  --   --   EOSABS 0.8*  --   --   BASOSABS 0.1  --   --   < > = values in this interval not displayed.  HEMOGLOBIN A1C Lab Results  Component Value Date   HGBA1C 4.7 (L) 10/18/2016   MPG 88.19 10/18/2016    Cardiac Panel (last 3 results)  Recent Labs  10/18/16 1452 10/18/16 2044 10/19/16 0311 10/20/16 0604  CKTOTAL  --   --  116 85  TROPONINI 0.10* 0.10* 0.11*  --   Lipid Panel     Component Value Date/Time   CHOL 177 10/18/2016 1452   TRIG 232 (H) 10/18/2016 1452   HDL 20 (L) 10/18/2016 1452   CHOLHDL 8.9 10/18/2016 1452   VLDL 46 (H) 10/18/2016 1452   LDLCALC 111 (H) 10/18/2016 1452   Hepatic Function Panel  Recent Labs  10/17/16 0850  PROT 8.0  ALBUMIN 3.7  AST 59*  ALT 48  ALKPHOS 72  BILITOT 1.4*   Ct Head Wo Contrast 10/17/2016 IMPRESSION: 1. No acute hemorrhage or definite acute infarct. 2. Advanced chronic small vessel ischemia which could easily obscure an acute white matter or deep gray nucleus infarct. 3. Small remote appearing right frontal cortex infarct.   Mr  Brain W Wo Contrast 10/17/2016 MRI HEAD IMPRESSION: 1. Multifocal acute to early subacute ischemic infarcts involving the bilateral cerebral and left cerebellar hemispheres as above, most prominent of which is a 2 cm infarct at the posterior left lentiform nucleus/corona radiata. No associated hemorrhage or mass effect. Possible central thromboembolic etiology suspected given the various vascular distributions involved. 2. Generalized age-related cerebral atrophy with advanced chronic microvascular ischemic disease.   Mr Maxine Glenn Head Wo Contrast 10/17/2016 MRA HEAD IMPRESSION: 1. Negative intracranial MRA. No large or proximal arterial branch occlusion. No high-grade or correctable stenosis. 2. Question short-segment moderate to severe bilateral A2 and proximal left M2 stenoses, somewhat limited in evaluation  due to extensive motion artif/act on this exam.   Cardiac Studies: EKG 10/17/2016: Sinus rhythm 79 bpm.  Borderline left axis deviation.  LVH.  ST T changes likely secondary to LVH.  Echocardiogram  10/18/2016 Left ventricle: The cavity size was normal. There was severe   concentric hypertrophy. Systolic function was normal. The   estimated ejection fraction was in the range of 60% to 65%. Galik   motion was normal; there were no regional Frayre motion   abnormalities. Doppler parameters are consistent with abnormal   left ventricular relaxation (grade 1 diastolic dysfunction). - Left atrium: The atrium was mildly dilated. - Atrial septum: No defect or patent foramen ovale was identified.  TEE 10/19/2016: Severe LVH, normal LVEF. No significant valvular abnormality.there is mild redundancy of the mitral valve chordae, borderline minimal prolapse without significant myxomatous degeneration of the mitral valve, mild mitral regurgitation. Trace to mild aortic regurgitation.  A very large PFO with very strongly positive right to left shunting noted both at rest and with strain Normal aorta.  Lexiscan sestamibi 10/20/2016:  1. No reversible ischemia or infarction.  2. LEFT ventricle dilatation and global hypokinesia.  3. Left ventricular ejection fraction 43%  4. Non invasive risk stratification*: Intermediate (classification due to low ejection fraction)   Scheduled Meds: .  stroke: mapping our early stages of recovery book   Does not apply Once  . amLODipine  5 mg Oral Daily  . aspirin  325 mg Oral Daily  . atorvastatin  40 mg Oral q1800  . heparin  5,000 Units Subcutaneous Q8H  . lisinopril  10 mg Oral Daily  . metoprolol tartrate  25 mg Oral BID  . senna-docusate  1 tablet Oral BID   Continuous Infusions: . 0.9 % NaCl with KCl 40 mEq / L 75 mL/hr (10/20/16 0336)   PRN Meds:.acetaminophen **OR** acetaminophen (TYLENOL) oral liquid 160 mg/5 mL **OR** acetaminophen,  ALPRAZolam, hydrALAZINE, ondansetron (ZOFRAN) IV   Assessment/Plan:  1. Cardio-embolic stroke 2. Very large PFO with strongly positive right to left shunting by double contrast injection. 3. Hypertension with hypertensive heart disease with severe LVH. 4. 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 5. Abnormal EKG due to hypertensive heart disease suggestive of LVH with strain, cannot suspect ischemia.  Recommendation: D/W Dr. Marvel Plan that the PFO is very large and very strongly positive right to left shunting both at rest and strain. No vascular disease by MRA/MRI. Telemetry: NSR without PVC or frequent PAC.  I strongly suspect PFO to be the etiology for paradoxical embolic stroke and recommend closure. Neurology team also in agreement and will schedule for this Tuesday 10/23/16.  I have discussed with the patient regarding the procedure and risks including <1% risk of recurrent stroke, device embolization, embolic MI and need for surgical intervention but not limited to  these including bleeding and pericardial effusion. He is willing to proceed.  Needs control of BP. Low risk stress test clinically. EF normal by Echo. Will have social worker consult for disability and insurance needs as he will be disabled for at least 3 months to 50m to a reasonable recovery.  Yates Decamp, M.D. 10/21/2016, 10:22 AM [facility] Pager: [phone] Office: [phone] If no answer: [phone]

## 2016-10-22 ENCOUNTER — Inpatient Hospital Stay (HOSPITAL_COMMUNITY): Payer: Medicaid Other

## 2016-10-22 DIAGNOSIS — I6300 Cerebral infarction due to thrombosis of unspecified precerebral artery: Secondary | ICD-10-CM

## 2016-10-22 MED ORDER — SODIUM CHLORIDE 0.9% FLUSH
3.0000 mL | Freq: Two times a day (BID) | INTRAVENOUS | Status: DC
  Administered 2016-10-22 – 2016-10-23 (×2): 3 mL via INTRAVENOUS

## 2016-10-22 MED ORDER — CEFAZOLIN SODIUM-DEXTROSE 2-4 GM/100ML-% IV SOLN
2.0000 g | INTRAVENOUS | Status: DC
  Filled 2016-10-22: qty 100

## 2016-10-22 MED ORDER — ASPIRIN 81 MG PO CHEW
81.0000 mg | CHEWABLE_TABLET | Freq: Every day | ORAL | Status: DC
  Administered 2016-10-22 – 2016-10-26 (×5): 81 mg via ORAL
  Filled 2016-10-22 (×5): qty 1

## 2016-10-22 MED ORDER — SODIUM CHLORIDE 0.9 % IV SOLN: 250.0000 mL | INTRAVENOUS | Status: DC | PRN

## 2016-10-22 MED ORDER — SODIUM CHLORIDE 0.9 % WEIGHT BASED INFUSION: 1.0000 mL/kg/h | INTRAVENOUS | Status: DC

## 2016-10-22 MED ORDER — SODIUM CHLORIDE 0.9% FLUSH: 3.0000 mL | INTRAVENOUS | Status: DC | PRN

## 2016-10-22 MED ORDER — ASPIRIN 81 MG PO CHEW: 81.0000 mg | CHEWABLE_TABLET | ORAL | Status: AC

## 2016-10-22 MED ORDER — SODIUM CHLORIDE 0.9 % WEIGHT BASED INFUSION: 3.0000 mL/kg/h | INTRAVENOUS | Status: DC

## 2016-10-22 NOTE — Plan of Care (Signed)
Tried to perform TCD bubble study before the PFO closure. However, pt has no window for TCD. Procedure aborted.   Marvel Plan, MD PhD Stroke Neurology 10/22/2016 4:53 PM

## 2016-10-22 NOTE — Progress Notes (Signed)
Physical Therapy Treatment Patient Details Name: Brandon [name] MRN: [id number] DOB: 1955/05/28 Today's Date: 10/22/2016    History of Present Illness 61 year old male with a history of depression, alcohol abuse, chronic kidney disease, morbid obesity, found on the floor by his father. Imaging revealed Multifocal acute to early subacute ischemic infarcts involving he bilateral cerebral and left cerebellar hemispheres.    PT Comments    Patient seen for activity progression. Tolerated well. Initially flat affect and appears somewhat withdrawn but as session progressed and patient began to see improvements and ability to move RLE patient became more engaged. Patient tolerated increased activities both in bed as well as EOB prior to transfer training and OOB to chair. Patient showing good use of left side and improvements in right side throughout session. Continue to feel that patient would benefit significantly from comprehensive therapies to maximize functional recovery during. Will continue to see and progress as tolerated. Continue to recommend CIR at this time.    Follow Up Recommendations  CIR     Equipment Recommendations  Wheelchair (measurements PT);Wheelchair cushion (measurements PT) (20x 20)    Recommendations for Other Services Rehab consult     Precautions / Restrictions Precautions Precautions: Fall Precaution Comments: right sided weakness Restrictions Weight Bearing Restrictions: No    Mobility  Bed Mobility Overal bed mobility: Needs Assistance Bed Mobility: Rolling;Sidelying to Sit Rolling: Mod assist;+2 for physical assistance Sidelying to sit: +2 for physical assistance;Mod assist       General bed mobility comments: cues for sequencing, assist of pad to roll and raise trunk.  Transfers Overall transfer level: Needs assistance   Transfers: Sit to/from Stand;Stand Pivot Transfers Sit to Stand: +2 physical assistance;Mod assist;From elevated surface Stand  pivot transfers: +2 physical assistance;Mod assist       General transfer comment: stood x 2 from bed, second trial pt pivoted with increased time toward L to chair, flexed posture, assist to rise, shift weight and steady, increased time  Ambulation/Gait                 Stairs            Wheelchair Mobility    Modified Rankin (Stroke Patients Only) Modified Rankin (Stroke Patients Only) Pre-Morbid Rankin Score: No symptoms Modified Rankin: Severe disability     Balance Overall balance assessment: Needs assistance Sitting-balance support: Feet supported Sitting balance-Leahy Scale: Fair Sitting balance - Comments: no LOB statically, worked on reaching activities, min to mod assist to return to midline when displaced to R     Standing balance-Leahy Scale: Poor Standing balance comment: flexed posture, +2 assist with R knee blocked                            Cognition Arousal/Alertness: Awake/alert Behavior During Therapy: Flat affect Overall Cognitive Status: Impaired/Different from baseline Area of Impairment: Memory;Problem solving                     Memory: Decreased short-term memory       Problem Solving: Slow processing;Difficulty sequencing;Requires verbal cues        Exercises Other Exercises Other Exercises: PNF patterns at EOB with max assist R UE Other Exercises: Resisted AROM RLE in supine from flexion to extension Other Exercises: Resisted plantar flexion in supine Other Exercises: AAROM Knee flexion, dorsiflexion RLE Other Exercises: Trunk control activities at EOB    General Comments  Pertinent Vitals/Pain Pain Assessment: No/denies pain    Home Living                      Prior Function            PT Goals (current goals can now be found in the care plan section) Acute Rehab PT Goals Patient Stated Goal: to get better PT Goal Formulation: With patient Time For Goal Achievement:  11/01/16 Potential to Achieve Goals: Good Progress towards PT goals: Progressing toward goals    Frequency    Min 4X/week      PT Plan Current plan remains appropriate    Co-evaluation PT/OT/SLP Co-Evaluation/Treatment: Yes Reason for Co-Treatment: For patient/therapist safety PT goals addressed during session: Mobility/safety with mobility OT goals addressed during session: ADL's and self-care      AM-PAC PT "6 Clicks" Daily Activity  Outcome Measure  Difficulty turning over in bed (including adjusting bedclothes, sheets and blankets)?: Unable Difficulty moving from lying on back to sitting on the side of the bed? : Unable Difficulty sitting down on and standing up from a chair with arms (e.g., wheelchair, bedside commode, etc,.)?: Unable Help needed moving to and from a bed to chair (including a wheelchair)?: A Lot Help needed walking in hospital room?: Total Help needed climbing 3-5 steps with a railing? : Total 6 Click Score: 7    End of Session Equipment Utilized During Treatment: Gait belt Activity Tolerance: Patient tolerated treatment well Patient left: in chair;with call bell/phone within reach;with chair alarm set Nurse Communication: Mobility status PT Visit Diagnosis: Hemiplegia and hemiparesis Hemiplegia - Right/Left: Right Hemiplegia - dominant/non-dominant: Dominant Hemiplegia - caused by: Cerebral infarction     Time: [id number] PT Time Calculation (min) (ACUTE ONLY): 23 min  Charges:  $Therapeutic Activity: 8-22 mins                    G Codes:       Charlotte Crumb, PT DPT  Board Certified Neurologic Specialist [phone]    Fabio Asa 10/22/2016, 11:20 AM

## 2016-10-22 NOTE — Progress Notes (Signed)
PRELIMINARY RESULTS: Transcranial Doppler with Bubbles attempted, patient has inadequate windows. Dr. Roda Shutters made aware.  [employer], RVS 10/22/16 16:58

## 2016-10-22 NOTE — Progress Notes (Signed)
Rehab admissions - I met with patient again this am.  He does not want me to talk to his family about who could be a caregiver after a potential rehab stay.  Patient is concerned about costs of rehab.  He mentioned [facility] as he had a son who was at that facility.  If he does not come up with someone to assist after rehab stay, I feel he will need SNF to allow sufficient recovery time.  Call me for questions.  #[phone]

## 2016-10-22 NOTE — Progress Notes (Signed)
Occupational Therapy Treatment Patient Details Name: Brandon [name] MRN: [id number] DOB: 21-Oct-1955 Today's Date: 10/22/2016    History of present illness 61 year old male with a history of depression, alcohol abuse, chronic kidney disease, morbid obesity, found on the floor by his father. Imaging revealed Multifocal acute to early subacute ischemic infarcts involving he bilateral cerebral and left cerebellar hemispheres.   OT comments  Pt is making steady gains. Demonstrating increased R elbow and shoulder extension strength. Able to sit at EOB and withstand min challenge to sitting balance through reaching activities. Began educating pt in compensatory strategies for UB dressing. Pt able to transfer with 2 person assist to chair and self feed with set up. Will continue to follow.  Follow Up Recommendations  CIR;Supervision/Assistance - 24 hour    Equipment Recommendations       Recommendations for Other Services Rehab consult    Precautions / Restrictions Precautions Precautions: Fall Precaution Comments: right sided weakness Restrictions Weight Bearing Restrictions: No       Mobility Bed Mobility Overal bed mobility: Needs Assistance Bed Mobility: Rolling;Sidelying to Sit Rolling: Mod assist;+2 for physical assistance Sidelying to sit: +2 for physical assistance;Mod assist       General bed mobility comments: cues for sequencing, assist of pad to roll and raise trunk.  Transfers Overall transfer level: Needs assistance   Transfers: Sit to/from Stand;Stand Pivot Transfers Sit to Stand: +2 physical assistance;Mod assist;From elevated surface Stand pivot transfers: +2 physical assistance;Mod assist       General transfer comment: stood x 2 from bed, second trial pt pivoted with increased time toward L to chair, flexed posture, assist to rise, shift weight and steady, increased time    Balance     Sitting balance-Leahy Scale: Fair Sitting balance - Comments: no  LOB statically, worked on reaching activities, min to mod assist to return to midline when displaced to R     Standing balance-Leahy Scale: Poor Standing balance comment: flexed posture, +2 assist with R knee blocked                           ADL either performed or assessed with clinical judgement   ADL Overall ADL's : Needs assistance/impaired Eating/Feeding: Set up;Sitting Eating/Feeding Details (indicate cue type and reason): in chair Grooming: Wash/dry hands;Sitting;Minimal assistance Grooming Details (indicate cue type and reason): assist to position R hand as he washed it Upper Body Bathing: Maximal assistance;Sitting       Upper Body Dressing : Maximal assistance;Sitting Upper Body Dressing Details (indicate cue type and reason): instructed in hemi techniques Lower Body Dressing: Total assistance;Bed level Lower Body Dressing Details (indicate cue type and reason): socks Toilet Transfer: +2 for physical assistance;Moderate assistance;Stand-pivot Toilet Transfer Details (indicate cue type and reason): simulated bed to chair Toileting- Clothing Manipulation and Hygiene: Total assistance;+2 for physical assistance;Sit to/from stand         General ADL Comments: Addressed trunk control at EOB with reaching activities outside of BoS. Requires min to mod assist when displaced to R to regain midline.     Vision   Additional Comments: cues to visually attend during reaching activities   Perception     Praxis      Cognition Arousal/Alertness: Awake/alert Behavior During Therapy: Flat affect Overall Cognitive Status: Impaired/Different from baseline Area of Impairment: Memory;Problem solving                     Memory: Decreased short-term  memory       Problem Solving: Slow processing;Difficulty sequencing;Requires verbal cues          Exercises Exercises: Other exercises Other Exercises Other Exercises: PNF patterns at EOB with max assist R  UE   Shoulder Instructions       General Comments      Pertinent Vitals/ Pain       Pain Assessment: No/denies pain  Home Living                                          Prior Functioning/Environment              Frequency  Min 3X/week        Progress Toward Goals  OT Goals(current goals can now be found in the care plan section)  Progress towards OT goals: Progressing toward goals  Acute Rehab OT Goals Patient Stated Goal: to get better OT Goal Formulation: With patient Time For Goal Achievement: 11/01/16 Potential to Achieve Goals: Good  Plan Discharge plan remains appropriate    Co-evaluation    PT/OT/SLP Co-Evaluation/Treatment: Yes Reason for Co-Treatment: For patient/therapist safety   OT goals addressed during session: ADL's and self-care      AM-PAC PT "6 Clicks" Daily Activity     Outcome Measure   Help from another person eating meals?: A Little Help from another person taking care of personal grooming?: A Lot Help from another person toileting, which includes using toliet, bedpan, or urinal?: Total Help from another person bathing (including washing, rinsing, drying)?: A Lot Help from another person to put on and taking off regular upper body clothing?: A Lot Help from another person to put on and taking off regular lower body clothing?: Total 6 Click Score: 11    End of Session    OT Visit Diagnosis: Unsteadiness on feet (R26.81);Other abnormalities of gait and mobility (R26.89);Hemiplegia and hemiparesis Hemiplegia - Right/Left: Right Hemiplegia - dominant/non-dominant: Dominant Hemiplegia - caused by: Cerebral infarction   Activity Tolerance Patient tolerated treatment well   Patient Left in chair;with call bell/phone within reach;with chair alarm set   Nurse Communication Mobility status;Need for lift equipment        Time: [phone] OT Time Calculation (min): 29 min  Charges: OT General Charges $OT  Visit: 1 Visit OT Treatments $Therapeutic Activity: 8-22 mins  10/22/2016 Martie Round, OTR/L Pager: [phone]   Iran Planas [place] B[employer]iff 10/22/2016, 9:51 AM

## 2016-10-22 NOTE — Progress Notes (Signed)
Subjective:  No complaints. Still unable to move right upper and lower extremities.  Objective:  Vital Signs in the last 24 hours: Temp:  [97.7 F (36.5 C)-99 F (37.2 C)] 99 F (37.2 C) (09/24 0950) Pulse Rate:  [56-90] 90 (09/24 0950) Resp:  [16-18] 18 (09/24 0950) BP: (126-172)/(73-92) 149/86 (09/24 0950) SpO2:  [97 %-100 %] 98 % (09/24 0950)  Intake/Output from previous day: 09/23 0701 - 09/24 0700 In: 600 [P.O.:600] Out: 1800 [Urine:1800] Intake/Output from this shift: No intake/output data recorded.  Physical Exam: General appearance: alert, cooperative, no distress and mildly obese Eyes: negative findings: lids and lashes normal Neck: no adenopathy, no carotid bruit, no JVD, supple, symmetrical, trachea midline and thyroid not enlarged, symmetric, no tenderness/mass/nodules Neck: JVP - normal, carotids 2+= without bruits Resp: clear to auscultation bilaterally Chest [place]: no tenderness Cardio: regular rate and rhythm, S1, S2 normal, no murmur, click, rub or gallop GI: soft, non-tender; bowel sounds normal; no masses,  no organomegaly Extremities: extremities normal, atraumatic, no cyanosis or edema and Neurologic: Dense right hemiplegia noted.  Vascular: ll the peripheral pulses are normal, Bilateral DP is faint. Probably normal variant.   Lab Results:  Recent Labs  10/20/16 0604 10/21/16 0523  WBC 7.4 9.4  HGB 14.9 16.6  PLT 196 199    Recent Labs  10/20/16 0604 10/21/16 0523  NA 140 139  K 3.7 3.9  CL 112* 109  CO2 22 22  GLUCOSE 101* 85  BUN 20 20  CREATININE 1.51* 1.56*   Imaging: MRA HEAD IMPRESSION:  1. Negative intracranial MRA. No large or proximal arterial branch occlusion. No high-grade or correctable stenosis. 2. Question short-segment moderate to severe bilateral A2 and proximal left M2 stenoses, somewhat limited in evaluation due to extensive motion artifact on this exam.  MRA NECK IMPRESSION:  1. Widely patent carotid artery  systems bilaterally without high-grade or critical flow limiting stenosis. 2. Short-segment moderate proximal right V1 stenosis. Vertebral arteries otherwise widely patent within the neck. 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  LE venous Doppler Summary: No evidence of deep vein or superficial thrombosis involving the right lower extremity and left lower extremity.  Other specific details can be found in the table(s) above. Prepared and Electronically Authenticated by  Wells Brabham IV, MD 2018-09-23T19:17:36   Cardiac Studies: TEE 10/19/2016 Large PFO seen.   10/20/2016: SPECT MPI IMPRESSION: 1. No reversible ischemia or infarction.  2. LEFT ventricle dilatation and global hypokinesia.  3. Left ventricular ejection fraction 43%  4. Non invasive risk stratification*: Intermediate (classification due to low ejection fraction)  Assessment: Right-sided hemiplegia, acute ischemic CVA Mild troponin elevation: Supply demand ischemia type 2 MI, in the setting of the above. No ischemia/infarct on stress test Large PFO, possible cause for cardioembolic CVA Hyperlipdemia  Recommendations: Continue ASA 81 mg. Will add plavix 75 mg. Plan for PFO closure 10/23/2016. No indication for cardiac catherization at this time. Recommend medical management for possible CAD. Continue hypertension and diabetes control as you are doing. Continue statin,   LOS: 5 days    Lieutenant Abarca J Tamalyn Wadsworth 10/22/2016, 12:06 PM  Kassius Battiste J Octavia Velador, MD [facility] Pager: [phone] Office: [phone] If no answer Cell [phone]    

## 2016-10-22 NOTE — Progress Notes (Signed)
PROGRESS NOTE    Brandon [name]  ZOX:[id number] DOB: 1955-03-24 DOA: 10/17/2016 PCP: Patient, No Pcp Per   Chief Complaint  Patient presents with  . Cerebrovascular Accident    Brief Narrative:  HPI on 10/17/2016 by Dr. Richarda Overlie 60 year old male with a history of depression, alcohol abuse, chronic kidney disease, morbid obesity, found on the floor by his father, EMS was called. Patient noted to have right-sided weakness including right upper right lower extremity weakness, according to the patient onset 48 hours ago prior to presentation. Patient also had a headache but no nausea vomiting. Denied any blurry vision, slurred speech, difficulty swallowing.  Assessment & Plan   Acute CVA -CTA showed no acute hemorrhage or definite acute infarct -MRI head showed multifocal acute to early subacute ischemic infarcts involving bilateral cerebral and left cerebellar hemispheres -MRA head unremarkable -MRA neck widely patent carotid artery systems bilaterally without high-grade critical flow limiting stenosis. -LDL 111, hemglobin A1c 4.7 -Echocardiogram EF 60-65%, grade 1 diastolic dysfunction -Carotid Doppler shows 139% ICA stenosis, antegrade vertebral flow -Continue aspirin and statin -Neurology consulted and appreciated -PT recommended CIR -will also ask SW to look into SNF -TEE showed large PFO -Ordered lower extremity doppler: negative for DVT or SVT -Cardiology recommended closure of the PFO- likely to occur on 10/23/16 (Discussed with Dr. Jacinto Halim)  Elevated troponin -likely secondary to the above -currently chest pain free -Troponins trending downward and flat -heparin drip held given CVA -Cardiology consulted and appreciated: Recommended aspirin and Plavix, hold heparin until the size of infarct is noted. Risk of hemorrhage with anticoagulation in setting of large infarct.  -s/p stress test, EF 43%, no reversible ischemia or infarction, LV dilatation and global hypokinesia.  Intermediate risk  Leukocytosis -resolved, likely secondary to the above -no complaints of shortnes of breath/cough, or urinary issues -CXR and UA unremarkable for infection -Currently afebrile   Hypokalemia -Replaced, continue to monitor BMP  Chronic kidney disease, stage III  -Creatinine appears to be stable -continue to monitor BMP  DVT Prophylaxis  Heparin SQ   Code Status: Full  Family Communication: Family at bedside  Disposition Plan: Admitted. Pending PFO closure on 10/23/2016. Dispo TBD  Consultants Neurology Cardiology  Procedures  Echocardiogram Carotid doppler  Antibiotics   Anti-infectives    None      Subjective:   Brandon [name] seen and examined today.  Continues to have right-sided weakness. Able to move to chair with the aid of therapy today. Denies chest pain comes on his breath, bone pain, nausea vomiting, diarrhea constipation, dizziness or headache.  Objective:   Vitals:   10/21/16 2216 10/22/16 0100 10/22/16 0602 10/22/16 0950  BP: 140/77 132/76 (!) 172/92 (!) 149/86  Pulse: 60 (!) 56 (!) 56 90  Resp: Temp: 97.8 F (36.6 C) 97.7 F (36.5 C) 98 F (36.7 C) 99 F (37.2 C)  TempSrc: Oral Oral Oral Oral  SpO2: 98% 100% 100% 98%  Weight:      Height:        Intake/Output Summary (Last 24 hours) at 10/22/16 1313 Last data filed at 10/22/16 0404  Gross per 24 hour  Intake              240 ml  Output              900 ml  Net             -660 ml   Filed Weights   10/17/16 2132 10/19/16 1413  Weight: 115.5 kg (254 lb 10.1 oz) 115.2 kg (254 lb)   Exam  General: Well developed, well nourished, NAD, appears stated age  HEENT: NCAT,mucous membranes moist.   Cardiovascular: S1 S2 auscultated, R, no murmurs.  Respiratory: Clear to auscultation bilaterally with equal chest rise  Abdomen: Soft, nontender, nondistended, + bowel sounds  Extremities: warm dry without cyanosis clubbing or edema  Neuro: AAOx3, right-sided  weakness, no new findings.  Psych: pleasant, appropriate mood and affect  Data Reviewed: I have personally reviewed following labs and imaging studies  CBC:  Recent Labs Lab 10/17/16 0850 10/18/16 0733 10/19/16 0311 10/20/16 0604 10/21/16 0523  WBC 12.3* 9.3 9.2 7.4 9.4  NEUTROABS 8.1*  --   --   --   --   HGB 17.8* 15.9 14.9 14.9 16.6  HCT 48.4 45.2 42.7 43.5 48.4  MCV 94.2 96.2 95.7 97.8 98.2  PLT 257 219 205 196 199   Basic Metabolic Panel:  Recent Labs Lab 10/17/16 0850 10/18/16 0733 10/19/16 0311 10/20/16 0604 10/21/16 0523  NA 142 143 140 140 139  K 3.4* 3.5 3.3* 3.7 3.9  CL 105 111 110 112* 109  CO2 GLUCOSE 101* 97 102* 101* 85  BUN 48* 33* 27* 20 20  CREATININE 1.80* 1.56* 1.54* 1.51* 1.56*  CALCIUM 9.2 8.7* 8.3* 8.6* 8.9   GFR: Estimated Creatinine Clearance: 65 mL/min (A) (by C-G formula based on SCr of 1.56 mg/dL (H)). Liver Function Tests:  Recent Labs Lab 10/17/16 0850  AST 59*  ALT 48  ALKPHOS 72  BILITOT 1.4*  PROT 8.0  ALBUMIN 3.7   No results for input(s): LIPASE, AMYLASE in the last 168 hours. No results for input(s): AMMONIA in the last 168 hours. Coagulation Profile:  Recent Labs Lab 10/17/16 0850  INR 1.25   Cardiac Enzymes:  Recent Labs Lab 10/17/16 1948 10/18/16 1452 10/18/16 2044 10/19/16 0311 10/20/16 0604  CKTOTAL  --   --   --  116 85  TROPONINI 0.21* 0.10* 0.10* 0.11*  --    BNP (last 3 results) No results for input(s): PROBNP in the last 8760 hours. HbA1C: No results for input(s): HGBA1C in the last 72 hours. CBG:  Recent Labs Lab 10/17/16 0857  GLUCAP 107*   Lipid Profile: No results for input(s): CHOL, HDL, LDLCALC, TRIG, CHOLHDL, LDLDIRECT in the last 72 hours. Thyroid Function Tests: No results for input(s): TSH, T4TOTAL, FREET4, T3FREE, THYROIDAB in the last 72 hours. Anemia Panel: No results for input(s): VITAMINB12, FOLATE, FERRITIN, TIBC, IRON, RETICCTPCT in the last 72  hours. Urine analysis:    Component Value Date/Time   COLORURINE YELLOW 10/17/2016 0836   APPEARANCEUR HAZY (A) 10/17/2016 0836   LABSPEC 1.023 10/17/2016 0836   PHURINE 5.0 10/17/2016 0836   GLUCOSEU NEGATIVE 10/17/2016 0836   HGBUR NEGATIVE 10/17/2016 0836   BILIRUBINUR NEGATIVE 10/17/2016 0836   KETONESUR 5 (A) 10/17/2016 0836   PROTEINUR 100 (A) 10/17/2016 0836   UROBILINOGEN 1.0 04/08/2008 2101   NITRITE NEGATIVE 10/17/2016 0836   LEUKOCYTESUR NEGATIVE 10/17/2016 0836   Sepsis Labs: (procalcitonin:4,lacticidven:4)  )No results found for this or any previous visit (from the past 240 hour(s)).    Radiology Studies: No results found.   Scheduled Meds: .  stroke: mapping our early stages of recovery book   Does not apply Once  . amLODipine  10 mg Oral Daily  . aspirin  81 mg Oral Daily  . atorvastatin  40 mg Oral q1800  .  heparin  5,000 Units Subcutaneous Q8H  . hydrALAZINE  25 mg Oral Q8H  . lisinopril  10 mg Oral Daily  . metoprolol tartrate  50 mg Oral BID  . senna-docusate  1 tablet Oral BID   Continuous Infusions: . 0.9 % NaCl with KCl 40 mEq / L 75 mL/hr (10/20/16 0336)     LOS: 5 days   Time Spent in minutes   30 minutes  Taelyr Jantz D.O. on 10/22/2016 at 1:13 PM  Between 7am to 7pm - Pager - [phone]  After 7pm go to [email or web address] - password [email or web address]  And look for the night coverage person covering for me after hours  [facility] Office  [phone]

## 2016-10-23 ENCOUNTER — Inpatient Hospital Stay (HOSPITAL_COMMUNITY)
Admission: EM | Disposition: A | Discharge status: Skilled Nursing Facility | Payer: Self-pay | Source: Home / Self Care | Attending: Internal Medicine

## 2016-10-23 HISTORY — PX: ULTRASOUND GUIDANCE FOR VASCULAR ACCESS: SHX6516

## 2016-10-23 HISTORY — PX: PATENT FORAMEN OVALE(PFO) CLOSURE: CATH118300

## 2016-10-23 LAB — BASIC METABOLIC PANEL
Anion gap: 9 (ref 5–15)
BUN: 25 mg/dL — ABNORMAL HIGH (ref 6–20)
CALCIUM: 8.8 mg/dL — AB (ref 8.9–10.3)
CO2: 22 mmol/L (ref 22–32)
CREATININE: 1.58 mg/dL — AB (ref 0.61–1.24)
Chloride: 106 mmol/L (ref 101–111)
GFR calc Af Amer: 53 mL/min — ABNORMAL LOW (ref >60)
GFR calc non Af Amer: 46 mL/min — ABNORMAL LOW (ref >60)
Glucose, Bld: 94 mg/dL (ref 65–99)
Potassium: 4 mmol/L (ref 3.5–5.1)
SODIUM: 137 mmol/L (ref 135–145)

## 2016-10-23 LAB — POCT ACTIVATED CLOTTING TIME
ACTIVATED CLOTTING TIME: 252 s
Activated Clotting Time: 224 seconds
Activated Clotting Time: 235 seconds

## 2016-10-23 LAB — CBC
HCT: 46.2 % (ref 39.0–52.0)
Hemoglobin: 16.1 g/dL (ref 13.0–17.0)
MCH: 33.8 pg (ref 26.0–34.0)
MCHC: 34.8 g/dL (ref 30.0–36.0)
MCV: 97.1 fL (ref 78.0–100.0)
PLATELETS: 195 10*3/uL (ref 150–400)
RBC: 4.76 MIL/uL (ref 4.22–5.81)
RDW: 12.9 % (ref 11.5–15.5)
WBC: 9.1 10*3/uL (ref 4.0–10.5)

## 2016-10-23 LAB — SURGICAL PCR SCREEN
MRSA, PCR: NEGATIVE
STAPHYLOCOCCUS AUREUS: NEGATIVE

## 2016-10-23 LAB — ANTITHROMBIN III: AntiThromb III Func: 83 % (ref 75–120)

## 2016-10-23 SURGERY — PATENT FORAMEN OVALE (PFO) CLOSURE
Anesthesia: LOCAL

## 2016-10-23 MED ORDER — MIDAZOLAM HCL 2 MG/2ML IJ SOLN
INTRAMUSCULAR | Status: AC
  Filled 2016-10-23: qty 2

## 2016-10-23 MED ORDER — MIDAZOLAM HCL 2 MG/2ML IJ SOLN
INTRAMUSCULAR | Status: DC | PRN
  Administered 2016-10-23: 1 mg via INTRAVENOUS

## 2016-10-23 MED ORDER — SODIUM CHLORIDE 0.9 % IV SOLN: 250.0000 mL | INTRAVENOUS | Status: DC | PRN

## 2016-10-23 MED ORDER — LIDOCAINE HCL 2 % IJ SOLN
INTRAMUSCULAR | Status: AC
  Filled 2016-10-23: qty 10

## 2016-10-23 MED ORDER — ONDANSETRON HCL 4 MG/2ML IJ SOLN
INTRAMUSCULAR | Status: AC
  Filled 2016-10-23: qty 2

## 2016-10-23 MED ORDER — HEPARIN SODIUM (PORCINE) 1000 UNIT/ML IJ SOLN
INTRAMUSCULAR | Status: DC | PRN
  Administered 2016-10-23: 2000 [IU] via INTRAVENOUS
  Administered 2016-10-23: 6000 [IU] via INTRAVENOUS
  Administered 2016-10-23: 2000 [IU] via INTRAVENOUS
  Administered 2016-10-23: 1500 [IU] via INTRAVENOUS

## 2016-10-23 MED ORDER — HEPARIN SODIUM (PORCINE) 1000 UNIT/ML IJ SOLN
INTRAMUSCULAR | Status: AC
  Filled 2016-10-23: qty 1

## 2016-10-23 MED ORDER — CEFAZOLIN SODIUM-DEXTROSE 2-3 GM-% IV SOLR
INTRAVENOUS | Status: DC | PRN
  Administered 2016-10-23: 2 g via INTRAVENOUS

## 2016-10-23 MED ORDER — SODIUM CHLORIDE 0.9% FLUSH
3.0000 mL | Freq: Two times a day (BID) | INTRAVENOUS | Status: DC
  Administered 2016-10-23 – 2016-10-25 (×3): 3 mL via INTRAVENOUS

## 2016-10-23 MED ORDER — LIDOCAINE HCL (PF) 1 % IJ SOLN
INTRAMUSCULAR | Status: DC | PRN
  Administered 2016-10-23: 20 mL

## 2016-10-23 MED ORDER — FENTANYL CITRATE (PF) 100 MCG/2ML IJ SOLN
INTRAMUSCULAR | Status: DC | PRN
  Administered 2016-10-23: 25 ug via INTRAVENOUS

## 2016-10-23 MED ORDER — IOPAMIDOL (ISOVUE-370) INJECTION 76%
INTRAVENOUS | Status: AC
  Filled 2016-10-23: qty 50

## 2016-10-23 MED ORDER — SODIUM CHLORIDE 0.9% FLUSH
3.0000 mL | INTRAVENOUS | Status: DC | PRN
  Administered 2016-10-25: 3 mL via INTRAVENOUS
  Filled 2016-10-23: qty 3

## 2016-10-23 MED ORDER — CEFAZOLIN SODIUM-DEXTROSE 2-4 GM/100ML-% IV SOLN
INTRAVENOUS | Status: AC
  Filled 2016-10-23: qty 100

## 2016-10-23 MED ORDER — FENTANYL CITRATE (PF) 100 MCG/2ML IJ SOLN
INTRAMUSCULAR | Status: AC
  Filled 2016-10-23: qty 2

## 2016-10-23 MED ORDER — LABETALOL HCL 5 MG/ML IV SOLN: 10.0000 mg | INTRAVENOUS | Status: AC | PRN

## 2016-10-23 MED ORDER — CLOPIDOGREL BISULFATE 300 MG PO TABS
ORAL_TABLET | ORAL | Status: AC
  Filled 2016-10-23: qty 2

## 2016-10-23 MED ORDER — CLOPIDOGREL BISULFATE 300 MG PO TABS
ORAL_TABLET | ORAL | Status: DC | PRN
  Administered 2016-10-23: 600 mg via ORAL

## 2016-10-23 MED ORDER — HEPARIN (PORCINE) IN NACL 2-0.9 UNIT/ML-% IJ SOLN
INTRAMUSCULAR | Status: AC | PRN
  Administered 2016-10-23: 1500 mL

## 2016-10-23 MED ORDER — HEPARIN (PORCINE) IN NACL 2-0.9 UNIT/ML-% IJ SOLN
INTRAMUSCULAR | Status: AC
  Filled 2016-10-23: qty 1000

## 2016-10-23 MED ORDER — CLOPIDOGREL BISULFATE 75 MG PO TABS
75.0000 mg | ORAL_TABLET | Freq: Every day | ORAL | Status: DC
  Administered 2016-10-24 – 2016-10-26 (×3): 75 mg via ORAL
  Filled 2016-10-23 (×3): qty 1

## 2016-10-23 SURGICAL SUPPLY — 23 items
BALLN SIZING AMPLATZER 24 (BALLOONS) ×3
BALLN SIZING AMPLATZER 34 (BALLOONS)
BALLOON SIZING AMPLATZER 24 (BALLOONS) ×2 IMPLANT
BALLOON SIZING AMPLATZER 34 (BALLOONS) IMPLANT
CATH ACUNAV 8FR 90CM (CATHETERS) ×3 IMPLANT
CATH INFINITI 6F MPA2 100CM (CATHETERS) ×3 IMPLANT
COVER PRB 48X5XTLSCP FOLD TPE (BAG) ×2 IMPLANT
COVER PROBE 5X48 (BAG) ×1
COVER SWIFTLINK CONNECTOR (BAG) ×3 IMPLANT
GUIDEWIRE AMPLATZER 1.5JX260 (WIRE) ×3 IMPLANT
GUIDEWIRE ANGLED .035X260CM (WIRE) ×3 IMPLANT
HOVERMATT SINGLE USE (MISCELLANEOUS) ×3 IMPLANT
KIT MICROINTRODUCER STIFF 5F (SHEATH) ×3 IMPLANT
OCCLUDER AMPLATZER PFO 25MM (Prosthesis & Implant Heart) ×3 IMPLANT
PACK CARDIAC CATHETERIZATION (CUSTOM PROCEDURE TRAY) ×3 IMPLANT
PROTECTION STATION PRESSURIZED (MISCELLANEOUS) ×3
SHEATH INTROD W/O MIN 9FR 25CM (SHEATH) ×3 IMPLANT
SHEATH PINNACLE 8F 10CM (SHEATH) ×3 IMPLANT
STATION PROTECTION PRESSURIZED (MISCELLANEOUS) ×2 IMPLANT
SYSTEM DELIVERY AMPLATZER 8FR (SHEATH) ×3 IMPLANT
TRANSDUCER W/STOPCOCK (MISCELLANEOUS) ×3 IMPLANT
TUBING CIL FLEX 10 FLL-RA (TUBING) ×3 IMPLANT
WIRE EMERALD 3MM-J .035X150CM (WIRE) ×3 IMPLANT

## 2016-10-23 NOTE — H&P (View-Only) (Signed)
Subjective:  No complaints. Still unable to move right upper and lower extremities.  Objective:  Vital Signs in the last 24 hours: Temp:  [97.7 F (36.5 C)-99 F (37.2 C)] 99 F (37.2 C) (09/24 0950) Pulse Rate:  [56-90] 90 (09/24 0950) Resp:  [16-18] 18 (09/24 0950) BP: (126-172)/(73-92) 149/86 (09/24 0950) SpO2:  [97 %-100 %] 98 % (09/24 0950)  Intake/Output from previous day: 09/23 0701 - 09/24 0700 In: 600 [P.O.:600] Out: 1800 [Urine:1800] Intake/Output from this shift: No intake/output data recorded.  Physical Exam: General appearance: alert, cooperative, no distress and mildly obese Eyes: negative findings: lids and lashes normal Neck: no adenopathy, no carotid bruit, no JVD, supple, symmetrical, trachea midline and thyroid not enlarged, symmetric, no tenderness/mass/nodules Neck: JVP - normal, carotids 2+= without bruits Resp: clear to auscultation bilaterally Chest [place]: no tenderness Cardio: regular rate and rhythm, S1, S2 normal, no murmur, click, rub or gallop GI: soft, non-tender; bowel sounds normal; no masses,  no organomegaly Extremities: extremities normal, atraumatic, no cyanosis or edema and Neurologic: Dense right hemiplegia noted.  Vascular: ll the peripheral pulses are normal, Bilateral DP is faint. Probably normal variant.   Lab Results:  Recent Labs  10/20/16 0604 10/21/16 0523  WBC 7.4 9.4  HGB 14.9 16.6  PLT 196 199    Recent Labs  10/20/16 0604 10/21/16 0523  NA 140 139  K 3.7 3.9  CL 112* 109  CO2 22 22  GLUCOSE 101* 85  BUN 20 20  CREATININE 1.51* 1.56*   Imaging: MRA HEAD IMPRESSION:  1. Negative intracranial MRA. No large or proximal arterial branch occlusion. No high-grade or correctable stenosis. 2. Question short-segment moderate to severe bilateral A2 and proximal left M2 stenoses, somewhat limited in evaluation due to extensive motion artifact on this exam.  MRA NECK IMPRESSION:  1. Widely patent carotid artery  systems bilaterally without high-grade or critical flow limiting stenosis. 2. Short-segment moderate proximal right V1 stenosis. Vertebral arteries otherwise widely patent within the neck. 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  LE venous Doppler Summary: No evidence of deep vein or superficial thrombosis involving the right lower extremity and left lower extremity.  Other specific details can be found in the table(s) above. Prepared and Electronically Authenticated by  Charlena Cross, MD 2018-09-23T19:17:36   Cardiac Studies: TEE 10/19/2016 Large PFO seen.   10/20/2016: SPECT MPI IMPRESSION: 1. No reversible ischemia or infarction.  2. LEFT ventricle dilatation and global hypokinesia.  3. Left ventricular ejection fraction 43%  4. Non invasive risk stratification*: Intermediate (classification due to low ejection fraction)  Assessment: Right-sided hemiplegia, acute ischemic CVA Mild troponin elevation: Supply demand ischemia type 2 MI, in the setting of the above. No ischemia/infarct on stress test Large PFO, possible cause for cardioembolic CVA Hyperlipdemia  Recommendations: Continue ASA 81 mg. Will add plavix 75 mg. Plan for PFO closure 10/23/2016. No indication for cardiac catherization at this time. Recommend medical management for possible CAD. Continue hypertension and diabetes control as you are doing. Continue statin,   LOS: 5 days    Chaselyn Nanney J Paetyn Pietrzak 10/22/2016, 12:06 PM  Cedrik Heindl Emiliano Dyer, MD [facility] [place] Pager: [phone] Office: [phone] If no answer Cell [phone]

## 2016-10-23 NOTE — Progress Notes (Signed)
Pt left unit for procedure- PFO closure with no noted distress. Pt and family aware of procedure. Pt denies pain or discomfort.

## 2016-10-23 NOTE — Care Management Note (Signed)
Case Management Note  Patient Details  Name: Brandon [name] MRN: [id number] Date of Birth: Oct 07, 1955  Subjective/Objective:  Pt admitted with CVA. He is from home with his father.                   Action/Plan: PT/OT recommending CIR. CSW also following for possible SNF rehab.  Pt having PFO closure today. CM following for d/c disposition.   Expected Discharge Date:   (unknown)               Expected Discharge Plan:  IP Rehab Facility  In-House Referral:  Clinical Social Work  Discharge planning Services  CM Consult  Post Acute Care Choice:    Choice offered to:     DME Arranged:    DME Agency:     HH Arranged:    HH Agency:     Status of Service:  In process, will continue to follow  If discussed at Long Length of Stay Meetings, dates discussed:    Additional Comments:  Kermit Balo, RN 10/23/2016, 1:43 PM

## 2016-10-23 NOTE — Progress Notes (Signed)
Paged K. Sofia with [facility] to address IVF order. Awaiting call back/order verification.

## 2016-10-23 NOTE — Interval H&P Note (Signed)
History and Physical Interval Note:  10/23/2016 3:55 PM  Brandon [name]  has presented today for surgery, with the diagnosis of pfo  The various methods of treatment have been discussed with the patient and family. After consideration of risks, benefits and other options for treatment, the patient has consented to  Procedure(s): Patent [place](PFO) Closure (N/A) as a surgical intervention .  The patient's history has been reviewed, patient examined, no change in status, stable for surgery.  I have reviewed the patient's chart and labs.  Questions were answered to the patient's satisfaction.     Geraldina Parrott J Aubrianne Molyneux

## 2016-10-23 NOTE — Progress Notes (Signed)
Patient seen for LE exercise session for facilitation of RLE function. Limited to bed level exercises as patient to head OTF for procedure following session. Patient tolerated well.   10/23/16 1600  PT Visit Information  Last PT Received On 10/23/16  Assistance Needed +2  PT/OT/SLP Co-Evaluation/Treatment Yes  History of Present Illness 61 year old male with a history of depression, alcohol abuse, chronic kidney disease, morbid obesity, found on the floor by his father. Imaging revealed Multifocal acute to early subacute ischemic infarcts involving he bilateral cerebral and left cerebellar hemispheres.  Subjective Data  Subjective Patient agreeable to therapies, surprised to realize that he is getting some return in function  Precautions  Precautions Fall  Restrictions  Weight Bearing Restrictions No  Pain Assessment  Pain Assessment Faces  Faces Pain Scale 4  Pain Location RLE with movement  Pain Descriptors / Indicators Sore;Spasm  Pain Intervention(s) Monitored during session  Cognition  Arousal/Alertness Awake/alert  Behavior During Therapy Flat affect  Other Exercises  Other Exercises AAROM Knee flexion/hip flexion  Other Exercises AAROM D2 LE pattern RLE  Other Exercises Resisted ROM knee extension and plantar flexion LE, Internal/external rotation   Other Exercises Short arc quads AAROM  Other Exercises Quad set   PT - Assessment/Plan  PT Plan Current plan remains appropriate  PT Visit Diagnosis Hemiplegia and hemiparesis  Hemiplegia - Right/Left Right  Hemiplegia - dominant/non-dominant Dominant  Hemiplegia - caused by Cerebral infarction  PT Frequency (ACUTE ONLY) Min 4X/week  Recommendations for Other Services Rehab consult  Follow Up Recommendations SNF (plan for SNF per chart)  PT equipment Wheelchair (measurements PT);Wheelchair cushion (measurements PT) (20x 20)  PT Goal Progression  Progress towards PT goals Progressing toward goals  Acute Rehab PT Goals   PT Goal Formulation With patient  Time For Goal Achievement 11/01/16  Potential to Achieve Goals Good  PT Time Calculation  PT Start Time (ACUTE ONLY) 1418  PT Stop Time (ACUTE ONLY) 1436  PT Time Calculation (min) (ACUTE ONLY) 18 min  PT General Charges  $$ ACUTE PT VISIT 1 Visit  PT Treatments  $Therapeutic Exercise 8-22 mins    Charlotte Crumb, PT DPT  Board Certified Neurologic Specialist [phone]

## 2016-10-23 NOTE — Progress Notes (Signed)
PROGRESS NOTE    Brandon [name]  ZOX:[id number] DOB: Apr 13, 1955 DOA: 10/17/2016 PCP: Patient, No Pcp Per   Chief Complaint  Patient presents with  . Cerebrovascular Accident    Brief Narrative:  HPI on 10/17/2016 by Dr. Richarda Overlie 61 year old male with a history of depression, alcohol abuse, chronic kidney disease, morbid obesity, found on the floor by his father, EMS was called. Patient noted to have right-sided weakness including right upper right lower extremity weakness, according to the patient onset 48 hours ago prior to presentation. Patient also had a headache but no nausea vomiting. Denied any blurry vision, slurred speech, difficulty swallowing.  Interim history Admitted for CVA and found to have large PFO, going for closure today. Will need SNF or CIR at discharge.   Assessment & Plan   Acute CVA -CTA showed no acute hemorrhage or definite acute infarct -MRI head showed multifocal acute to early subacute ischemic infarcts involving bilateral cerebral and left cerebellar hemispheres -MRA head unremarkable -MRA neck widely patent carotid artery systems bilaterally without high-grade critical flow limiting stenosis. -LDL 111, hemglobin A1c 4.7 -Echocardiogram EF 60-65%, grade 1 diastolic dysfunction -Carotid Doppler shows 139% ICA stenosis, antegrade vertebral flow -Continue aspirin and statin -Neurology consulted and appreciated -PT recommended CIR -will also ask SW to look into SNF -TEE showed large PFO -Ordered lower extremity doppler: negative for DVT or SVT -Cardiology recommended closure of the PFO- hopefully will occur today 10/23/16 (Discussed with Dr. Jacinto Halim)  Elevated troponin -likely secondary to the above -currently chest pain free -Troponins trending downward and flat -heparin drip held given CVA -Cardiology consulted and appreciated: Recommended aspirin and Plavix, hold heparin until the size of infarct is noted. Risk of hemorrhage with anticoagulation in  setting of large infarct.  -s/p stress test, EF 43%, no reversible ischemia or infarction, LV dilatation and global hypokinesia. Intermediate risk  Leukocytosis -resolved, likely secondary to the above -no complaints of shortness of breath/cough, or urinary issues -CXR and UA unremarkable for infection -Currently afebrile   Hypokalemia -Replaced, continue to monitor BMP  Chronic kidney disease, stage III  -Creatinine appears to be stable, creatinine currently 1.58 -continue to monitor BMP  DVT Prophylaxis  Heparin SQ   Code Status: Full  Family Communication: None at bedside  Disposition Plan: Admitted. Pending PFO closure today 10/23/2016. Dispo TBD  Consultants Neurology Cardiology  Procedures  Echocardiogram Carotid doppler  Antibiotics   Anti-infectives    Start     Dose/Rate Route Frequency Ordered Stop   10/23/16 1500  ceFAZolin (ANCEF) IVPB 2g/100 mL premix     2 g 200 mL/hr over 30 Minutes Intravenous On call 10/22/16 1716 10/24/16 1500      Subjective:   Brandon [name] seen and examined today.  Continues to have right sided weakness. Looking forward to his PFO closure today. Denies chest pain, shortness of breath, abdominal pain, nausea, vomiting, diarrhea, constipation. dizziness, headache.  Objective:   Vitals:   10/23/16 0100 10/23/16 0500 10/23/16 0800 10/23/16 1022  BP: 134/72 138/77 139/77 (!) 142/83  Pulse: 62 (!) 59 60   Resp: Temp: 97.8 F (36.6 C) 97.9 F (36.6 C) 97.9 F (36.6 C)   TempSrc: Oral Oral Oral   SpO2: 94% 94% 98%   Weight:      Height:        Intake/Output Summary (Last 24 hours) at 10/23/16 1303 Last data filed at 10/23/16 1050  Gross per 24 hour  Intake  3 ml  Output             1300 ml  Net            -1297 ml   Filed Weights   10/17/16 2132 10/19/16 1413  Weight: 115.5 kg (254 lb 10.1 oz) 115.2 kg (254 lb)   Exam  General: Well developed, well nourished, NAD, appears stated age  HEENT:  NCAT, mucous membranes moist.   Cardiovascular: S1 S2 auscultated, RRR, no murmurs  Respiratory: Clear to auscultation bilaterally with equal chest rise  Abdomen: Soft, nontender, nondistended, + bowel sounds  Extremities: warm dry without cyanosis clubbing or edema  Neuro: AAOx3, nonfocal  Psych: Appropriate mood and affect  Data Reviewed: I have personally reviewed following labs and imaging studies  CBC:  Recent Labs Lab 10/17/16 0850 10/18/16 0733 10/19/16 0311 10/20/16 0604 10/21/16 0523 10/23/16 0533  WBC 12.3* 9.3 9.2 7.4 9.4 9.1  NEUTROABS 8.1*  --   --   --   --   --   HGB 17.8* 15.9 14.9 14.9 16.6 16.1  HCT 48.4 45.2 42.7 43.5 48.4 46.2  MCV 94.2 96.2 95.7 97.8 98.2 97.1  PLT 257 219 205 196 199 195   Basic Metabolic Panel:  Recent Labs Lab 10/18/16 0733 10/19/16 0311 10/20/16 0604 10/21/16 0523 10/23/16 0533  NA 143 140 140 139 137  K 3.5 3.3* 3.7 3.9 4.0  CL 111 110 112* 109 106  CO2 GLUCOSE 97 102* 101* 85 94  BUN 33* 27* 20 20 25*  CREATININE 1.56* 1.54* 1.51* 1.56* 1.58*  CALCIUM 8.7* 8.3* 8.6* 8.9 8.8*   GFR: Estimated Creatinine Clearance: 64.2 mL/min (A) (by C-G formula based on SCr of 1.58 mg/dL (H)). Liver Function Tests:  Recent Labs Lab 10/17/16 0850  AST 59*  ALT 48  ALKPHOS 72  BILITOT 1.4*  PROT 8.0  ALBUMIN 3.7   No results for input(s): LIPASE, AMYLASE in the last 168 hours. No results for input(s): AMMONIA in the last 168 hours. Coagulation Profile:  Recent Labs Lab 10/17/16 0850  INR 1.25   Cardiac Enzymes:  Recent Labs Lab 10/17/16 1948 10/18/16 1452 10/18/16 2044 10/19/16 0311 10/20/16 0604  CKTOTAL  --   --   --  116 85  TROPONINI 0.21* 0.10* 0.10* 0.11*  --    BNP (last 3 results) No results for input(s): PROBNP in the last 8760 hours. HbA1C: No results for input(s): HGBA1C in the last 72 hours. CBG:  Recent Labs Lab 10/17/16 0857  GLUCAP 107*   Lipid Profile: No results  for input(s): CHOL, HDL, LDLCALC, TRIG, CHOLHDL, LDLDIRECT in the last 72 hours. Thyroid Function Tests: No results for input(s): TSH, T4TOTAL, FREET4, T3FREE, THYROIDAB in the last 72 hours. Anemia Panel: No results for input(s): VITAMINB12, FOLATE, FERRITIN, TIBC, IRON, RETICCTPCT in the last 72 hours. Urine analysis:    Component Value Date/Time   COLORURINE YELLOW 10/17/2016 0836   APPEARANCEUR HAZY (A) 10/17/2016 0836   LABSPEC 1.023 10/17/2016 0836   PHURINE 5.0 10/17/2016 0836   GLUCOSEU NEGATIVE 10/17/2016 0836   HGBUR NEGATIVE 10/17/2016 0836   BILIRUBINUR NEGATIVE 10/17/2016 0836   KETONESUR 5 (A) 10/17/2016 0836   PROTEINUR 100 (A) 10/17/2016 0836   UROBILINOGEN 1.0 04/08/2008 2101   NITRITE NEGATIVE 10/17/2016 0836   LEUKOCYTESUR NEGATIVE 10/17/2016 0836   Sepsis Labs: (procalcitonin:4,lacticidven:4)  ) Recent Results (from the past 240 hour(s))  Surgical pcr screen  Status: None   Collection Time: 10/23/16  4:23 AM  Result Value Ref Range Status   MRSA, PCR NEGATIVE NEGATIVE Final   Staphylococcus aureus NEGATIVE NEGATIVE Final    Comment: (NOTE) The Xpert SA Assay (FDA approved for NASAL specimens in patients 97 years of age and older), is one component of a comprehensive surveillance program. It is not intended to diagnose infection nor to guide or monitor treatment.       Radiology Studies: No results found.   Scheduled Meds: .  stroke: mapping our early stages of recovery book   Does not apply Once  . amLODipine  10 mg Oral Daily  . aspirin  81 mg Oral Daily  . atorvastatin  40 mg Oral q1800  . heparin  5,000 Units Subcutaneous Q8H  . hydrALAZINE  25 mg Oral Q8H  . lisinopril  10 mg Oral Daily  . metoprolol tartrate  50 mg Oral BID  . senna-docusate  1 tablet Oral BID  . sodium chloride flush  3 mL Intravenous Q12H   Continuous Infusions: . sodium chloride    . 0.9 % NaCl with KCl 40 mEq / L 75 mL/hr (10/20/16 0336)  . sodium  chloride    .  ceFAZolin (ANCEF) IV       LOS: 6 days   Time Spent in minutes   30 minutes  Jamey Demchak D.O. on 10/23/2016 at 1:03 PM  Between 7am to 7pm - Pager - [phone]  After 7pm go to [email or web address] - password [email or web address]  And look for the night coverage person covering for me after hours  [facility] Office  [phone]

## 2016-10-24 ENCOUNTER — Encounter (HOSPITAL_COMMUNITY): Payer: Self-pay | Admitting: Cardiology

## 2016-10-24 ENCOUNTER — Inpatient Hospital Stay (HOSPITAL_COMMUNITY): Payer: Medicaid Other

## 2016-10-24 LAB — POCT ACTIVATED CLOTTING TIME
ACTIVATED CLOTTING TIME: 213 s
Activated Clotting Time: 197 seconds

## 2016-10-24 LAB — BASIC METABOLIC PANEL
Anion gap: 10 (ref 5–15)
BUN: 26 mg/dL — AB (ref 6–20)
CHLORIDE: 105 mmol/L (ref 101–111)
CO2: 21 mmol/L — AB (ref 22–32)
Calcium: 8.8 mg/dL — ABNORMAL LOW (ref 8.9–10.3)
Creatinine, Ser: 1.61 mg/dL — ABNORMAL HIGH (ref 0.61–1.24)
GFR calc Af Amer: 52 mL/min — ABNORMAL LOW (ref >60)
GFR calc non Af Amer: 45 mL/min — ABNORMAL LOW (ref >60)
Glucose, Bld: 98 mg/dL (ref 65–99)
POTASSIUM: 4.2 mmol/L (ref 3.5–5.1)
SODIUM: 136 mmol/L (ref 135–145)

## 2016-10-24 LAB — CBC
HCT: 48.6 % (ref 39.0–52.0)
HEMOGLOBIN: 17 g/dL (ref 13.0–17.0)
MCH: 34.1 pg — AB (ref 26.0–34.0)
MCHC: 35 g/dL (ref 30.0–36.0)
MCV: 97.4 fL (ref 78.0–100.0)
Platelets: 157 10*3/uL (ref 150–400)
RBC: 4.99 MIL/uL (ref 4.22–5.81)
RDW: 13 % (ref 11.5–15.5)
WBC: 9.3 10*3/uL (ref 4.0–10.5)

## 2016-10-24 LAB — LUPUS ANTICOAGULANT PANEL
DRVVT: 38.3 s (ref 0.0–47.0)
PTT Lupus Anticoagulant: 33.7 s (ref 0.0–51.9)

## 2016-10-24 LAB — PROTEIN C ACTIVITY: Protein C Activity: 95 % (ref 73–180)

## 2016-10-24 LAB — PROTEIN S, TOTAL: PROTEIN S AG TOTAL: 81 % (ref 60–150)

## 2016-10-24 LAB — PROTEIN S ACTIVITY: PROTEIN S ACTIVITY: 90 % (ref 63–140)

## 2016-10-24 LAB — HOMOCYSTEINE: Homocysteine: 27.6 umol/L — ABNORMAL HIGH (ref 0.0–15.0)

## 2016-10-24 MED ORDER — SODIUM CHLORIDE 0.9 % IV SOLN
INTRAVENOUS | Status: DC
  Administered 2016-10-24: 22:00:00 via INTRAVENOUS

## 2016-10-24 MED FILL — Lidocaine HCl Local Inj 2%: INTRAMUSCULAR | Qty: 10 | Status: AC

## 2016-10-24 NOTE — Progress Notes (Signed)
[facility]                                                                              Patient Demographics  Brandon [name], is a 61 y.o. male, DOB - [birth year], ZOX:[id number]  Admit date - 10/17/2016   Admitting Physician Richarda Overlie, MD  Outpatient Primary MD for the patient is Patient, No Pcp Per  Outpatient specialists:   LOS - 7  days   Medical records reviewed and are as summarized below:    Chief Complaint  Patient presents with  . Cerebrovascular Accident       Brief summary   HPI on 10/17/2016 by Dr. Richarda Overlie 61 year old male with a history of depression, alcohol abuse, chronic kidney disease, morbid obesity, found on the floor by his father, EMS was called. Patient noted to have right-sided weakness including right upper right lower extremity weakness, according to the patient onset 48 hours ago prior to presentation. Patient also had a headache but no nausea vomiting. Denied any blurry vision, slurred speech, difficulty swallowing.  Interim history Admitted for CVA and found to have large PFO, Underwent closure procedure on 9/25   Assessment & Plan    Principal Problem:   Acute CVA (cerebral vascular accident) [facility]) - presented with right-sided weakness, headache - MRI of the brain showed multifocal acute to early subacute ischemic infarcts involving bilateral cerebral and left cerebellar hemispheres. - MRA head and neck unremarkable, widely patent carotid artery systems bilaterally without high grade critical flow-limiting stenosis - LDL 111 placed on statin/Lipitor - Hemoglobin A1c 4.7 - 2-D echo showed EF of 60-65%, grade I diastolic dysfunction - Carotid Doppler showed 1-39% ICA stenosis, antegrade vertebral flow - PT recommended CIR, CIR recommended skilled nursing facility - TEE showed a large PFO, lower extremity Dopplers negative for DVT or SVT - 2-D echo post PFO closure pending - continue aspirin 81 mg daily, Plavix  Active  Problems: Large PFO (patent foramen ovale) - status post closure on 9/25 - Cardiology following, 2-D echo post PFO closure pending  Elevated troponin -Likely secondary to acute CVA, currently no chest pain - 2-D echo showed EF of 60-65% with grade 1 diastolic dysfunction - Cardiology recommended aspirin, Plavix, statin  -stress test 9/22 showed EF of 43% with no reversible ischemia or infarction, elevated dilatation and global hypokinesia, intermediate risk  Hyperlipidemia - Continue statin  Leukocytosis - Resolved, chest x-ray, UA unremarkable for infection, no fevers  Acute on Chronic kidney disease stage III - presented with creatinine of 1.8,Baseline creatinine 1.5-1.6 - currently at baseline   Code Status: full  DVT Prophylaxis:   SCD's Family Communication: Discussed in detail with the patient, all imaging results, lab results explained to the patient   Disposition Plan:  SNF when a bed available, Transfer to the floor today  Time Spent in minutes   35 minutes  Procedures:  PFO closure   Consultants:   Cardiology Neurology  Antimicrobials:      Medications  Scheduled Meds: .  stroke: mapping our early stages of recovery book   Does not apply Once  . amLODipine  10 mg Oral Daily  .  aspirin  81 mg Oral Daily  . atorvastatin  40 mg Oral q1800  . clopidogrel  75 mg Oral Q breakfast  . heparin  5,000 Units Subcutaneous Q8H  . hydrALAZINE  25 mg Oral Q8H  . metoprolol tartrate  50 mg Oral BID  . senna-docusate  1 tablet Oral BID  . sodium chloride flush  3 mL Intravenous Q12H   Continuous Infusions: . sodium chloride    . sodium chloride     PRN Meds:.sodium chloride, acetaminophen **OR** acetaminophen (TYLENOL) oral liquid 160 mg/5 mL **OR** acetaminophen, ALPRAZolam, hydrALAZINE, ondansetron (ZOFRAN) IV, sodium chloride flush   Antibiotics   Anti-infectives    Start     Dose/Rate Route Frequency Ordered Stop   10/23/16 1557  ceFAZolin (ANCEF) IVPB  2 g/50 mL premix  Status:  Discontinued     over 30 Minutes  Continuous PRN 10/23/16 1557 10/23/16 1758   10/23/16 1500  ceFAZolin (ANCEF) IVPB 2g/100 mL premix  Status:  Discontinued     2 g 200 mL/hr over 30 Minutes Intravenous On call 10/22/16 1716 10/23/16 2022        Subjective:   Brandon [name] was seen and examined today.  Somewhat frustrated, didn't sleep well last night. Patient denies dizziness, chest pain, shortness of breath, abdominal pain, N/V/D/C. No acute events overnight.  Still has right-sided weakness.  Objective:   Vitals:   10/23/16 2250 10/24/16 0100 10/24/16 0400 10/24/16 0848  BP:  114/76 136/89 (!) 139/97  Pulse: 79  85 (!) 105  Resp:  13 15   Temp:   98.3 F (36.8 C) 99.5 F (37.5 C)  TempSrc:   Oral Axillary  SpO2:   94% 96%  Weight:   115.2 kg (253 lb 15.5 oz)   Height:        Intake/Output Summary (Last 24 hours) at 10/24/16 1110 Last data filed at 10/24/16 0850  Gross per 24 hour  Intake              360 ml  Output              800 ml  Net             -440 ml     Wt Readings from Last 3 Encounters:  10/24/16 115.2 kg (253 lb 15.5 oz)  06/04/08 (!) 119.9 kg (264 lb 6.1 oz)  04/22/08 (!) 121.7 kg (268 lb 6.1 oz)     Exam  General: Alert and oriented x 3, NAD  Eyes:   HEENT:  Atraumatic, normocephalic  Cardiovascular: S1 S2 auscultated, no rubs, murmurs or gallops. Regular rate and rhythm.  Respiratory: Clear to auscultation bilaterally, no wheezing, rales or rhonchi  Gastrointestinal: Soft, nontender, nondistended, + bowel sounds  Ext: no pedal edema bilaterally  Neuro: right-sided weakness, LLE, LUE 5/5   Musculoskeletal: No digital cyanosis, clubbing  Skin: No rashes  Psych: Normal affect and demeanor, alert and oriented x3    Data Reviewed:  I have personally reviewed following labs and imaging studies  Micro Results Recent Results (from the past 240 hour(s))  Surgical pcr screen     Status: None   Collection Time:  10/23/16  4:23 AM  Result Value Ref Range Status   MRSA, PCR NEGATIVE NEGATIVE Final   Staphylococcus aureus NEGATIVE NEGATIVE Final    Comment: (NOTE) The Xpert SA Assay (FDA approved for NASAL specimens in patients 60 years of age and older), is one component of a comprehensive surveillance program. It  is not intended to diagnose infection nor to guide or monitor treatment.     Radiology Reports Dg Chest 2 View  Result Date: 10/17/2016 CLINICAL DATA:  Uncontrolled hypertension and right-sided weakness EXAM: CHEST  2 VIEW COMPARISON:  04/28/2008 FINDINGS: Cardiac shadow is at the upper limits of normal in size. The lungs are well aerated bilaterally. Previously seen left basilar changes have resolved in the interval. No acute bony abnormality is seen. IMPRESSION: No active cardiopulmonary disease. Electronically Signed   By: Alcide Clever M.D.   On: 10/17/2016 15:17   Dg Ankle 2 Views Right  Result Date: 10/18/2016 CLINICAL DATA:  Right ankle pain.  No injury. EXAM: RIGHT ANKLE - 2 VIEW COMPARISON:  None. FINDINGS: There is no evidence of fracture, dislocation, or joint effusion. The talar dome is intact. The ankle mortise is symmetric. Achilles and plantar enthesopathy. Dorsal spurring of the talar neck. Bone mineralization is normal. Soft tissues are unremarkable. IMPRESSION: No acute osseous abnormality. Electronically Signed   By: Obie Dredge M.D.   On: 10/18/2016 13:36   Ct Head Wo Contrast  Result Date: 10/17/2016 CLINICAL DATA:  Right-sided weakness and facial droop with leaning. Symptoms since waking. EXAM: CT HEAD WITHOUT CONTRAST TECHNIQUE: Contiguous axial images were obtained from the base of the skull through the vertex without intravenous contrast. COMPARISON:  None. FINDINGS: Brain: No evidence of acute cortical infarction. No hemorrhage, hydrocephalus, or masslike findings. There is advanced small-vessel type ischemic injury in the cerebral white matter and likely right  pons. In general these changes are chronic, but specific areas of insults are age-indeterminate by CT. Small remote appearing high right frontal cortex infarct. Vascular: Atherosclerotic calcification. Vessels appear large in tortuous, which can be a sign of chronic hypertension. Skull: No acute or aggressive finding. Sinuses/Orbits: Negative. IMPRESSION: 1. No acute hemorrhage or definite acute infarct. 2. Advanced chronic small vessel ischemia which could easily obscure an acute white matter or deep gray nucleus infarct. 3. Small remote appearing right frontal cortex infarct. Electronically Signed   By: Marnee Spring M.D.   On: 10/17/2016 09:42   Mr Maxine Glenn Head Wo Contrast  Result Date: 10/17/2016 CLINICAL DATA:  Initial evaluation for acute right-sided weakness, right-sided facial droop. EXAM: MRI HEAD WITHOUT AND WITH CONTRAST MRA HEAD WITHOUT CONTRAST MRA NECK WITHOUT AND WITH CONTRAST TECHNIQUE: Multiplanar, multiecho pulse sequences of the brain and surrounding structures were obtained without intravenous contrast. Angiographic images of the Circle of Willis were obtained using MRA technique without intravenous contrast. Angiographic images of the neck were obtained using MRA technique without and with intravenous contrast. Carotid stenosis measurements (when applicable) are obtained utilizing NASCET criteria, using the distal internal carotid diameter as the denominator. CONTRAST:  20mL MULTIHANCE GADOBENATE DIMEGLUMINE 529 MG/ML IV SOLN COMPARISON:  Prior CT from earlier same day. FINDINGS: MRI HEAD FINDINGS Study degraded by motion artifact. Diffuse prominence of the CSF containing spaces compatible with generalized cerebral atrophy. Extensive patchy and confluent T2/FLAIR hyperintensity within the periventricular and deep white matter both cerebral hemispheres, consistent with chronic microvascular disease. Superimposed remote lacunar infarcts present within the bilateral basal ganglia as well as the  periventricular and deep white matter both cerebral hemispheres. Remote lacunar infarct present within the pons and middle cerebellar peduncle is as well. Approximate 2 cm acute ischemic infarct seen involving the posterior limb of the left and sternal capsule extending into the posterior left corona radiata (series 3, image 27). Patchy extension into the left subinsular white matter (series 3, image  24). Additional 5 mm ischemic infarct at the posterior left mesial temporal lobe (series 3, image 21). Few additional punctate cortical and subcortical infarcts seen involving the right cerebral hemisphere (series 3, image 33, 16) as well as the superior left cerebral hemisphere (series 3, image 35). Probable additional acute/early subacute punctate left cerebellar infarct (series 7, image 14). No associated hemorrhage or mass effect. The no evidence for acute intracranial hemorrhage. Single subcentimeter chronic microhemorrhage noted at the right external capsule (series 10, image 16). No mass lesion, midline shift or mass effect. No hydrocephalus. No extra-axial fluid collection. Major dural sinuses patent. No abnormal enhancement. Pituitary gland normal. Right vertebral artery diminutive and not well visualized. Major intracranial vascular flow voids otherwise maintained. Craniocervical junction normal. Upper cervical spine within normal limits. Bone marrow signal intensity normal. No scalp soft tissue abnormality. Globes and orbital soft tissues within normal limits. Mild mucosal thickening within the ethmoidal air cells and maxillary sinuses. Superimposed small left maxillary sinus retention cyst noted. Mastoids are clear. Inner ear structures normal. MRA HEAD FINDINGS ANTERIOR CIRCULATION: Study moderately degraded by motion artifact. Petrous, cavernous, and supraclinoid segments of the internal carotid arteries are patent without flow-limiting stenosis. Origin of the ophthalmic arteries patent bilaterally. ICA  termini widely patent. A1 segments patent bilaterally. Anterior communicating artery not well seen on this motion degraded exam. There is an apparent focal severe stenosis of the mid-distal left A2 segment (series 6, image 116). Possible additional focal moderate stenosis involving the distal right A2 segment (series 6, image 133). M1 segments patent without stenosis or occlusion. MCA bifurcations normal. No obvious proximal M2 occlusion. Possible short-segment moderate to severe proximal left M2 stenosis noted (series 604, image 13). Distal MCA branches not well evaluated on this motion degraded exam, but are grossly symmetric and well perfused. POSTERIOR CIRCULATION: Left vertebral artery dominant and widely patent to the vertebrobasilar junction. Right vertebral artery diffusely hypoplastic and patent as well without obvious stenosis. Origin of the posterior inferior cerebral arteries not seen on this exam. Basilar artery mildly tortuous but widely patent to its distal aspect without stenosis. Superior cerebral arteries patent proximally. Both of the posterior cerebral arteries primarily supplied via the basilar. PCAs widely patent proximally, not well evaluated distally due to motion. No aneurysm or vascular malformation. MRA NECK FINDINGS Source images reviewed. Visualized aortic arch of normal caliber with normal 3 vessel morphology. No flow-limiting stenosis about the origin of the great vessels. Visualized subclavian arteries widely patent. Right common and internal carotid artery is widely patent within the neck without high-grade or critical stenosis. No significant atheromatous narrowing about the right carotid bifurcation. Left common and internal carotid artery's widely patent within the neck without high-grade or critical stenosis. Mild atheromatous irregularity about the left carotid bifurcation without flow-limiting stenosis. Both vertebral arteries arise from the subclavian arteries. Left vertebral  artery dominant with a diffusely hypoplastic right vertebral artery. Short-segment moderate stenosis at the proximal right P1 segment (series 16109, image 10). Mild atheromatous irregularity within the distal right V2/V3 segments. Pre foraminal left V1 segment tortuous. No other high-grade or flow-limiting stenosis within the vertebral arteries bilaterally. IMPRESSION: MRI HEAD IMPRESSION: 1. Multifocal acute to early subacute ischemic infarcts involving the bilateral cerebral and left cerebellar hemispheres as above, most prominent of which is a 2 cm infarct at the posterior left lentiform nucleus/corona radiata. No associated hemorrhage or mass effect. Possible central thromboembolic etiology suspected given the various vascular distributions involved. 2. Generalized age-related cerebral atrophy with advanced chronic microvascular  ischemic disease. MRA HEAD IMPRESSION: 1. Negative intracranial MRA. No large or proximal arterial branch occlusion. No high-grade or correctable stenosis. 2. Question short-segment moderate to severe bilateral A2 and proximal left M2 stenoses, somewhat limited in evaluation due to extensive motion artifact on this exam. MRA NECK IMPRESSION: 1. Widely patent carotid artery systems bilaterally without high-grade or critical flow limiting stenosis. 2. Short-segment moderate proximal right V1 stenosis. Vertebral arteries otherwise widely patent within the neck. Left vertebral artery is dominant with a diffusely hypoplastic right vertebral artery. Electronically Signed   By: Rise Mu M.D.   On: 10/17/2016 15:24   Mr Angiogram Neck W Or Wo Contrast  Result Date: 10/17/2016 CLINICAL DATA:  Initial evaluation for acute right-sided weakness, right-sided facial droop. EXAM: MRI HEAD WITHOUT AND WITH CONTRAST MRA HEAD WITHOUT CONTRAST MRA NECK WITHOUT AND WITH CONTRAST TECHNIQUE: Multiplanar, multiecho pulse sequences of the brain and surrounding structures were obtained without  intravenous contrast. Angiographic images of the Circle of Willis were obtained using MRA technique without intravenous contrast. Angiographic images of the neck were obtained using MRA technique without and with intravenous contrast. Carotid stenosis measurements (when applicable) are obtained utilizing NASCET criteria, using the distal internal carotid diameter as the denominator. CONTRAST:  20mL MULTIHANCE GADOBENATE DIMEGLUMINE 529 MG/ML IV SOLN COMPARISON:  Prior CT from earlier same day. FINDINGS: MRI HEAD FINDINGS Study degraded by motion artifact. Diffuse prominence of the CSF containing spaces compatible with generalized cerebral atrophy. Extensive patchy and confluent T2/FLAIR hyperintensity within the periventricular and deep white matter both cerebral hemispheres, consistent with chronic microvascular disease. Superimposed remote lacunar infarcts present within the bilateral basal ganglia as well as the periventricular and deep white matter both cerebral hemispheres. Remote lacunar infarct present within the pons and middle cerebellar peduncle is as well. Approximate 2 cm acute ischemic infarct seen involving the posterior limb of the left and sternal capsule extending into the posterior left corona radiata (series 3, image 27). Patchy extension into the left subinsular white matter (series 3, image 24). Additional 5 mm ischemic infarct at the posterior left mesial temporal lobe (series 3, image 21). Few additional punctate cortical and subcortical infarcts seen involving the right cerebral hemisphere (series 3, image 33, 16) as well as the superior left cerebral hemisphere (series 3, image 35). Probable additional acute/early subacute punctate left cerebellar infarct (series 7, image 14). No associated hemorrhage or mass effect. The no evidence for acute intracranial hemorrhage. Single subcentimeter chronic microhemorrhage noted at the right external capsule (series 10, image 16). No mass lesion,  midline shift or mass effect. No hydrocephalus. No extra-axial fluid collection. Major dural sinuses patent. No abnormal enhancement. Pituitary gland normal. Right vertebral artery diminutive and not well visualized. Major intracranial vascular flow voids otherwise maintained. Craniocervical junction normal. Upper cervical spine within normal limits. Bone marrow signal intensity normal. No scalp soft tissue abnormality. Globes and orbital soft tissues within normal limits. Mild mucosal thickening within the ethmoidal air cells and maxillary sinuses. Superimposed small left maxillary sinus retention cyst noted. Mastoids are clear. Inner ear structures normal. MRA HEAD FINDINGS ANTERIOR CIRCULATION: Study moderately degraded by motion artifact. Petrous, cavernous, and supraclinoid segments of the internal carotid arteries are patent without flow-limiting stenosis. Origin of the ophthalmic arteries patent bilaterally. ICA termini widely patent. A1 segments patent bilaterally. Anterior communicating artery not well seen on this motion degraded exam. There is an apparent focal severe stenosis of the mid-distal left A2 segment (series 6, image 116). Possible additional focal moderate stenosis  involving the distal right A2 segment (series 6, image 133). M1 segments patent without stenosis or occlusion. MCA bifurcations normal. No obvious proximal M2 occlusion. Possible short-segment moderate to severe proximal left M2 stenosis noted (series 604, image 13). Distal MCA branches not well evaluated on this motion degraded exam, but are grossly symmetric and well perfused. POSTERIOR CIRCULATION: Left vertebral artery dominant and widely patent to the vertebrobasilar junction. Right vertebral artery diffusely hypoplastic and patent as well without obvious stenosis. Origin of the posterior inferior cerebral arteries not seen on this exam. Basilar artery mildly tortuous but widely patent to its distal aspect without stenosis.  Superior cerebral arteries patent proximally. Both of the posterior cerebral arteries primarily supplied via the basilar. PCAs widely patent proximally, not well evaluated distally due to motion. No aneurysm or vascular malformation. MRA NECK FINDINGS Source images reviewed. Visualized aortic arch of normal caliber with normal 3 vessel morphology. No flow-limiting stenosis about the origin of the great vessels. Visualized subclavian arteries widely patent. Right common and internal carotid artery is widely patent within the neck without high-grade or critical stenosis. No significant atheromatous narrowing about the right carotid bifurcation. Left common and internal carotid artery's widely patent within the neck without high-grade or critical stenosis. Mild atheromatous irregularity about the left carotid bifurcation without flow-limiting stenosis. Both vertebral arteries arise from the subclavian arteries. Left vertebral artery dominant with a diffusely hypoplastic right vertebral artery. Short-segment moderate stenosis at the proximal right P1 segment (series 16109, image 10). Mild atheromatous irregularity within the distal right V2/V3 segments. Pre foraminal left V1 segment tortuous. No other high-grade or flow-limiting stenosis within the vertebral arteries bilaterally. IMPRESSION: MRI HEAD IMPRESSION: 1. Multifocal acute to early subacute ischemic infarcts involving the bilateral cerebral and left cerebellar hemispheres as above, most prominent of which is a 2 cm infarct at the posterior left lentiform nucleus/corona radiata. No associated hemorrhage or mass effect. Possible central thromboembolic etiology suspected given the various vascular distributions involved. 2. Generalized age-related cerebral atrophy with advanced chronic microvascular ischemic disease. MRA HEAD IMPRESSION: 1. Negative intracranial MRA. No large or proximal arterial branch occlusion. No high-grade or correctable stenosis. 2. Question  short-segment moderate to severe bilateral A2 and proximal left M2 stenoses, somewhat limited in evaluation due to extensive motion artifact on this exam. MRA NECK IMPRESSION: 1. Widely patent carotid artery systems bilaterally without high-grade or critical flow limiting stenosis. 2. Short-segment moderate proximal right V1 stenosis. Vertebral arteries otherwise widely patent within the neck. Left vertebral artery is dominant with a diffusely hypoplastic right vertebral artery. Electronically Signed   By: Rise Mu M.D.   On: 10/17/2016 15:24   Mr Laqueta Jean UE Contrast  Result Date: 10/17/2016 CLINICAL DATA:  Initial evaluation for acute right-sided weakness, right-sided facial droop. EXAM: MRI HEAD WITHOUT AND WITH CONTRAST MRA HEAD WITHOUT CONTRAST MRA NECK WITHOUT AND WITH CONTRAST TECHNIQUE: Multiplanar, multiecho pulse sequences of the brain and surrounding structures were obtained without intravenous contrast. Angiographic images of the Circle of Willis were obtained using MRA technique without intravenous contrast. Angiographic images of the neck were obtained using MRA technique without and with intravenous contrast. Carotid stenosis measurements (when applicable) are obtained utilizing NASCET criteria, using the distal internal carotid diameter as the denominator. CONTRAST:  20mL MULTIHANCE GADOBENATE DIMEGLUMINE 529 MG/ML IV SOLN COMPARISON:  Prior CT from earlier same day. FINDINGS: MRI HEAD FINDINGS Study degraded by motion artifact. Diffuse prominence of the CSF containing spaces compatible with generalized cerebral atrophy. Extensive patchy and confluent  T2/FLAIR hyperintensity within the periventricular and deep white matter both cerebral hemispheres, consistent with chronic microvascular disease. Superimposed remote lacunar infarcts present within the bilateral basal ganglia as well as the periventricular and deep white matter both cerebral hemispheres. Remote lacunar infarct present  within the pons and middle cerebellar peduncle is as well. Approximate 2 cm acute ischemic infarct seen involving the posterior limb of the left and sternal capsule extending into the posterior left corona radiata (series 3, image 27). Patchy extension into the left subinsular white matter (series 3, image 24). Additional 5 mm ischemic infarct at the posterior left mesial temporal lobe (series 3, image 21). Few additional punctate cortical and subcortical infarcts seen involving the right cerebral hemisphere (series 3, image 33, 16) as well as the superior left cerebral hemisphere (series 3, image 35). Probable additional acute/early subacute punctate left cerebellar infarct (series 7, image 14). No associated hemorrhage or mass effect. The no evidence for acute intracranial hemorrhage. Single subcentimeter chronic microhemorrhage noted at the right external capsule (series 10, image 16). No mass lesion, midline shift or mass effect. No hydrocephalus. No extra-axial fluid collection. Major dural sinuses patent. No abnormal enhancement. Pituitary gland normal. Right vertebral artery diminutive and not well visualized. Major intracranial vascular flow voids otherwise maintained. Craniocervical junction normal. Upper cervical spine within normal limits. Bone marrow signal intensity normal. No scalp soft tissue abnormality. Globes and orbital soft tissues within normal limits. Mild mucosal thickening within the ethmoidal air cells and maxillary sinuses. Superimposed small left maxillary sinus retention cyst noted. Mastoids are clear. Inner ear structures normal. MRA HEAD FINDINGS ANTERIOR CIRCULATION: Study moderately degraded by motion artifact. Petrous, cavernous, and supraclinoid segments of the internal carotid arteries are patent without flow-limiting stenosis. Origin of the ophthalmic arteries patent bilaterally. ICA termini widely patent. A1 segments patent bilaterally. Anterior communicating artery not well seen  on this motion degraded exam. There is an apparent focal severe stenosis of the mid-distal left A2 segment (series 6, image 116). Possible additional focal moderate stenosis involving the distal right A2 segment (series 6, image 133). M1 segments patent without stenosis or occlusion. MCA bifurcations normal. No obvious proximal M2 occlusion. Possible short-segment moderate to severe proximal left M2 stenosis noted (series 604, image 13). Distal MCA branches not well evaluated on this motion degraded exam, but are grossly symmetric and well perfused. POSTERIOR CIRCULATION: Left vertebral artery dominant and widely patent to the vertebrobasilar junction. Right vertebral artery diffusely hypoplastic and patent as well without obvious stenosis. Origin of the posterior inferior cerebral arteries not seen on this exam. Basilar artery mildly tortuous but widely patent to its distal aspect without stenosis. Superior cerebral arteries patent proximally. Both of the posterior cerebral arteries primarily supplied via the basilar. PCAs widely patent proximally, not well evaluated distally due to motion. No aneurysm or vascular malformation. MRA NECK FINDINGS Source images reviewed. Visualized aortic arch of normal caliber with normal 3 vessel morphology. No flow-limiting stenosis about the origin of the great vessels. Visualized subclavian arteries widely patent. Right common and internal carotid artery is widely patent within the neck without high-grade or critical stenosis. No significant atheromatous narrowing about the right carotid bifurcation. Left common and internal carotid artery's widely patent within the neck without high-grade or critical stenosis. Mild atheromatous irregularity about the left carotid bifurcation without flow-limiting stenosis. Both vertebral arteries arise from the subclavian arteries. Left vertebral artery dominant with a diffusely hypoplastic right vertebral artery. Short-segment moderate  stenosis at the proximal right P1 segment (series  11505, image 10). Mild atheromatous irregularity within the distal right V2/V3 segments. Pre foraminal left V1 segment tortuous. No other high-grade or flow-limiting stenosis within the vertebral arteries bilaterally. IMPRESSION: MRI HEAD IMPRESSION: 1. Multifocal acute to early subacute ischemic infarcts involving the bilateral cerebral and left cerebellar hemispheres as above, most prominent of which is a 2 cm infarct at the posterior left lentiform nucleus/corona radiata. No associated hemorrhage or mass effect. Possible central thromboembolic etiology suspected given the various vascular distributions involved. 2. Generalized age-related cerebral atrophy with advanced chronic microvascular ischemic disease. MRA HEAD IMPRESSION: 1. Negative intracranial MRA. No large or proximal arterial branch occlusion. No high-grade or correctable stenosis. 2. Question short-segment moderate to severe bilateral A2 and proximal left M2 stenoses, somewhat limited in evaluation due to extensive motion artifact on this exam. MRA NECK IMPRESSION: 1. Widely patent carotid artery systems bilaterally without high-grade or critical flow limiting stenosis. 2. Short-segment moderate proximal right V1 stenosis. Vertebral arteries otherwise widely patent within the neck. Left vertebral artery is dominant with a diffusely hypoplastic right vertebral artery. Electronically Signed   By: Rise Mu M.D.   On: 10/17/2016 15:24   Nm Myocar Single W/planar W/Punches Motion And Ef  Result Date: 10/20/2016 CLINICAL DATA:  61 year old male with chest pain. EXAM: MYOCARDIAL IMAGING WITH SPECT (REST AND PHARMACOLOGIC-STRESS) GATED LEFT VENTRICULAR [place] MOTION STUDY LEFT VENTRICULAR EJECTION FRACTION TECHNIQUE: Standard myocardial SPECT imaging was performed after resting intravenous injection of 10 mCi Tc-61m tetrofosmin. Subsequently, intravenous infusion of Lexiscan was performed under the  supervision of the Cardiology staff. At peak effect of the drug, 30 mCi Tc-69m tetrofosmin was injected intravenously and standard myocardial SPECT imaging was performed. Quantitative gated imaging was also performed to evaluate left ventricular Berkheimer motion, and estimate left ventricular ejection fraction. COMPARISON:  Chest radiograph 10/17/2016 FINDINGS: Perfusion: No decreased activity in the left ventricle on stress imaging to suggest reversible ischemia or infarction. Camilli Motion: The LEFT ventricle is dilated at rest and stress. Global hypokinesia. No focal [place]perance motion abnormality Left Ventricular Ejection Fraction: 43 % End diastolic volume 150 ml End systolic volume 86 ml IMPRESSION: 1. No reversible ischemia or infarction. 2. LEFT ventricle dilatation and global hypokinesia. 3. Left ventricular ejection fraction 43% 4. Non invasive risk stratification*: Intermediate (classification due to low ejection fraction) *2012 Appropriate Use Criteria for Coronary Revascularization Focused Update: J Am Coll Cardiol. 2012;59(9):857-881. http://content.dementiazones.com.aspx?articleid=[email or web address] Electronically Signed   By: Genevive Bi M.D.   On: 10/20/2016 12:35    Lab Data:  CBC:  Recent Labs Lab 10/19/16 0311 10/20/16 0604 10/21/16 0523 10/23/16 0533 10/24/16 0301  WBC 9.2 7.4 9.4 9.1 9.3  HGB 14.9 14.9 16.6 16.1 17.0  HCT 42.7 43.5 48.4 46.2 48.6  MCV 95.7 97.8 98.2 97.1 97.4  PLT 205 196 199 195 157   Basic Metabolic Panel:  Recent Labs Lab 10/19/16 0311 10/20/16 0604 10/21/16 0523 10/23/16 0533 10/24/16 0301  NA 140 140 139 137 136  K 3.3* 3.7 3.9 4.0 4.2  CL 110 112* 109 106 105  CO2 24 22 22 22  21*  GLUCOSE 102* 101* 85 94 98  BUN 27* 20 20 25* 26*  CREATININE 1.54* 1.51* 1.56* 1.58* 1.61*  CALCIUM 8.3* 8.6* 8.9 8.8* 8.8*   GFR: Estimated Creatinine Clearance: 63 mL/min (A) (by C-G formula based on SCr of 1.61 mg/dL (H)). Liver Function Tests: No results for  input(s): AST, ALT, ALKPHOS, BILITOT, PROT, ALBUMIN in the last 168 hours. No results for input(s): LIPASE, AMYLASE in  the last 168 hours. No results for input(s): AMMONIA in the last 168 hours. Coagulation Profile: No results for input(s): INR, PROTIME in the last 168 hours. Cardiac Enzymes:  Recent Labs Lab 10/17/16 1948 10/18/16 1452 10/18/16 2044 10/19/16 0311 10/20/16 0604  CKTOTAL  --   --   --  116 85  TROPONINI 0.21* 0.10* 0.10* 0.11*  --    BNP (last 3 results) No results for input(s): PROBNP in the last 8760 hours. HbA1C: No results for input(s): HGBA1C in the last 72 hours. CBG: No results for input(s): GLUCAP in the last 168 hours. Lipid Profile: No results for input(s): CHOL, HDL, LDLCALC, TRIG, CHOLHDL, LDLDIRECT in the last 72 hours. Thyroid Function Tests: No results for input(s): TSH, T4TOTAL, FREET4, T3FREE, THYROIDAB in the last 72 hours. Anemia Panel: No results for input(s): VITAMINB12, FOLATE, FERRITIN, TIBC, IRON, RETICCTPCT in the last 72 hours. Urine analysis:    Component Value Date/Time   COLORURINE YELLOW 10/17/2016 0836   APPEARANCEUR HAZY (A) 10/17/2016 0836   LABSPEC 1.023 10/17/2016 0836   PHURINE 5.0 10/17/2016 0836   GLUCOSEU NEGATIVE 10/17/2016 0836   HGBUR NEGATIVE 10/17/2016 0836   BILIRUBINUR NEGATIVE 10/17/2016 0836   KETONESUR 5 (A) 10/17/2016 0836   PROTEINUR 100 (A) 10/17/2016 0836   UROBILINOGEN 1.0 04/08/2008 2101   NITRITE NEGATIVE 10/17/2016 0836   LEUKOCYTESUR NEGATIVE 10/17/2016 0836     Luqman Perrelli M.D. [facility] 10/24/2016, 11:10 AM  Pager: [phone] Between 7am to 7pm - call Pager - [phone]  After 7pm go to [email or web address] - password [email or web address]  Call night coverage person covering after 7pm

## 2016-10-24 NOTE — Care Management Note (Addendum)
Case Management Note  Patient Details  Name: Raymont Andreoni MRN: [id number] Date of Birth: 01-May-1955  Subjective/Objective:    From home with his father, admitted with CVA, s/p PFO closure per CIR is rec SNF, CSW aware.   9/27 1105 Letha Cape RN, BSN - MD is planning for dc to SNF on Friday.               Action/Plan: NCM will follow along with CSW for dc needs.  Expected Discharge Date:   (unknown)               Expected Discharge Plan:  Skilled Nursing Facility  In-House Referral:  Clinical Social Work  Discharge planning Services  CM Consult  Post Acute Care Choice:    Choice offered to:     DME Arranged:    DME Agency:     HH Arranged:    HH Agency:     Status of Service:  Completed, signed off  If discussed at [employer], dates discussed:    Additional Comments:  Leone Haven, RN 10/24/2016, 10:13 AM

## 2016-10-24 NOTE — Progress Notes (Signed)
  Speech Language Pathology Treatment: Cognitive-Linquistic  Patient Details Name: Brandon [name] MRN: [id number] DOB: [birth year] Today's Date: 10/24/2016 Time: 4098-1191 SLP Time Calculation (min) (ACUTE ONLY): 26 min  Assessment / Plan / Recommendation Clinical Impression  SLP followed up for cognitive linguistic intervention and education post CVA. Pts ex wife present who helped obtain more information regarding PLOF.  Pt alert during interaction however appears restless and stated pain in back despite repositioning attempts, RN notified of pts pain. Pt admits to acute cognitive deficits post CVA which include decreased recall, decreased speed of processing, poor frustration tolerance, and decreased executive function skills. Educated regarding compensatory strategies to aid in increased independence including use of visual aids, memory log, and intentional cognitive stimulation. Expressive and receptive language skills appear intact though pt with very delayed responses to questions which pts ex wife states is his baseline, "He likes to think a lot before he responds to questions". Pt denied word finding difficulty. Pt will benefit from further ST intervention post acute stay to improve safety and independence as prior level pt was independent with all ADLs per ex wife.       HPI HPI: Pt is a 61 y.o.malewas a past medical history of hypertension, hyperlipidemia, depression, alcohol abuse, chronic kidney disease and obesity, who was found on the floor by his family. Patient was noted to have right-sided weakness and some dysarthria. MRI of the brain was done, that showed multiple acute/subacute ischemic infarcts in bilateral cerebral hemispheres and left cerebellar hemisphere.       SLP Plan  Continue with current plan of care       Recommendations                   General recommendations: Rehab consult Plan: Continue with current plan of care       GO                Marcene Duos MA, CCC-SLP Acute Care Speech Language Pathologist    Brandon [name] 10/24/2016, 1:59 PM

## 2016-10-24 NOTE — Progress Notes (Signed)
*  PRELIMINARY RESULTS* Echocardiogram 2D Echocardiogram limited with color has been performed.  Brandon [name] 10/24/2016, 4:17 PM

## 2016-10-24 NOTE — Progress Notes (Signed)
Subjective:  No complaints. Still unable to move right upper and lower extremities. S/p PFO closure 10/23/16  Objective:  Vital Signs in the last 24 hours: Temp:  [97.9 F (36.6 C)-99.5 F (37.5 C)] 97.9 F (36.6 C) (09/26 1448) Pulse Rate:  [59-105] 94 (09/26 1448) Resp:  [13-23] 15 (09/26 0400) BP: (114-152)/(76-99) 135/84 (09/26 1448) SpO2:  [94 %-100 %] 96 % (09/26 1448) Weight:  [115.2 kg (253 lb 15.5 oz)] 115.2 kg (253 lb 15.5 oz) (09/26 0400)  Intake/Output from previous day: 09/25 0701 - 09/26 0700 In: 123 [P.O.:120; I.V.:3] Out: 1600 [Urine:1600] Intake/Output from this shift: Total I/O In: 480 [P.O.:480] Out: 300 [Urine:300]  Physical Exam: General appearance: alert, cooperative, no distress and mildly obese Eyes: negative findings: lids and lashes normal Neck: no adenopathy, no carotid bruit, no JVD, supple, symmetrical, trachea midline and thyroid not enlarged, symmetric, no tenderness/mass/nodules Neck: JVP - normal, carotids 2+= without bruits Resp: clear to auscultation bilaterally Chest [place]: no tenderness Cardio: regular rate and rhythm, S1, S2 normal, no murmur, click, rub or gallop. Rt groin site stable. No hematoma GI: soft, non-tender; bowel sounds normal; no masses,  no organomegaly Extremities: extremities normal, atraumatic, no cyanosis or edema and Neurologic: Dense right hemiplegia noted.  Vascular: ll the peripheral pulses are normal, Bilateral DP is faint. Probably normal variant.   Lab Results:  Recent Labs  10/23/16 0533 10/24/16 0301  WBC 9.1 9.3  HGB 16.1 17.0  PLT 195 157    Recent Labs  10/23/16 0533 10/24/16 0301  NA 137 136  K 4.0 4.2  CL 106 105  CO2 22 21*  GLUCOSE 94 98  BUN 25* 26*  CREATININE 1.58* 1.61*   Imaging: MRA HEAD IMPRESSION:  1. Negative intracranial MRA. No large or proximal arterial branch occlusion. No high-grade or correctable stenosis. 2. Question short-segment moderate to severe bilateral A2  and proximal left M2 stenoses, somewhat limited in evaluation due to extensive motion artifact on this exam.  MRA NECK IMPRESSION:  1. Widely patent carotid artery systems bilaterally without high-grade or critical flow limiting stenosis. 2. Short-segment moderate proximal right V1 stenosis. Vertebral arteries otherwise widely patent within the neck. 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  LE venous Doppler Summary: No evidence of deep vein or superficial thrombosis involving the right lower extremity and left lower extremity.  Other specific details can be found in the table(s) above. Prepared and Electronically Authenticated by  Charlena Cross, MD 2018-09-23T19:17:36   Cardiac Studies: TEE 10/19/2016 Large PFO seen.   10/20/2016: SPECT MPI IMPRESSION: 1. No reversible ischemia or infarction.  2. LEFT ventricle dilatation and global hypokinesia.  3. Left ventricular ejection fraction 43%  4. Non invasive risk stratification*: Intermediate (classification due to low ejection fraction)  Assessment: Right-sided hemiplegia, acute ischemic CVA Mild troponin elevation: Supply demand ischemia type 2 MI, in the setting of the above. No ischemia/infarct on stress test Large PFO, possible cause for cardioembolic CVA. S/p Successful closure of the atrial septal defect/PFO under intracardiac echo guidance with implantation of a  ASD with a 25 mm Amplatzer PFO Septal Occluder. (10/23/2016) Post-deployment double contrast study revealed No residual shunting. Formal echocardiogram pending Hyperlipdemia  Recommendations: Continue ASA 81 mg, plavix 75 mg for at least 3 months No indication for cardiac catherization at this time. Recommend medical management for possible CAD. Continue hypertension and diabetes control as you are doing.  Continue statin,   LOS: 7 days  Kathye Cipriani J Page Pucciarelli 10/24/2016, 5:55  PM  Suhaila Troiano Emiliano Dyer, MD [facility] Pager: [phone] Office: [phone] If no answer Cell [phone]

## 2016-10-25 LAB — BASIC METABOLIC PANEL
ANION GAP: 5 (ref 5–15)
BUN: 24 mg/dL — ABNORMAL HIGH (ref 6–20)
CALCIUM: 8.7 mg/dL — AB (ref 8.9–10.3)
CO2: 26 mmol/L (ref 22–32)
Chloride: 103 mmol/L (ref 101–111)
Creatinine, Ser: 1.65 mg/dL — ABNORMAL HIGH (ref 0.61–1.24)
GFR calc Af Amer: 51 mL/min — ABNORMAL LOW (ref >60)
GFR, EST NON AFRICAN AMERICAN: 44 mL/min — AB (ref >60)
GLUCOSE: 96 mg/dL (ref 65–99)
Potassium: 4.1 mmol/L (ref 3.5–5.1)
SODIUM: 134 mmol/L — AB (ref 135–145)

## 2016-10-25 LAB — BETA-2-GLYCOPROTEIN I ABS, IGG/M/A
Beta-2 Glyco I IgG: <9 GPI IgG units (ref 0–20)
Beta-2-Glycoprotein I IgA: <9 GPI IgA units (ref 0–25)

## 2016-10-25 LAB — ECHOCARDIOGRAM LIMITED
Height: 71 in
Weight: 4063.52 oz

## 2016-10-25 LAB — PROTEIN C, TOTAL: Protein C, Total: 88 % (ref 60–150)

## 2016-10-25 LAB — CARDIOLIPIN ANTIBODIES, IGG, IGM, IGA: Anticardiolipin IgG: <9 GPL U/mL (ref 0–14)

## 2016-10-25 NOTE — Progress Notes (Signed)
Physical Therapy Treatment Patient Details Name: Brandon [name] MRN: [id number] DOB: 24-Oct-1955 Today's Date: 10/25/2016    History of Present Illness 61 year old male with a history of depression, alcohol abuse, chronic kidney disease, morbid obesity, found on the floor by his father. Imaging revealed Multifocal acute to early subacute ischemic infarcts involving he bilateral cerebral and left cerebellar hemispheres.    PT Comments    Patient progressing with sitting activities and able to perform several ADL tasks with OT while working on awareness, progression of trunk control and spatial orientation.  Continue to feel pt will need STSNF rehab at d/c.  Pt. Very motivated and works hard to achieve goals of each session.  PT to follow acutely.   Follow Up Recommendations  SNF     Equipment Recommendations  Wheelchair (measurements PT);Wheelchair cushion (measurements PT) (20x20)    Recommendations for Other Services       Precautions / Restrictions Precautions Precautions: Fall Precaution Comments: right sided weakness Restrictions Weight Bearing Restrictions: No    Mobility  Bed Mobility Overal bed mobility: Needs Assistance Bed Mobility: Rolling;Sidelying to Sit Rolling: Supervision Sidelying to sit: Mod assist       General bed mobility comments: able to reach for rail and roll to R with cues, assist for trunk upright and R LE off bed to come to sit, cues for hand placement/technique  Transfers Overall transfer level: Needs assistance Equipment used: 2 person hand held assist Transfers: Sit to/from Stand;Stand Pivot Transfers Sit to Stand: +2 physical assistance;Mod assist;From elevated surface Stand pivot transfers: +2 physical assistance;Mod assist       General transfer comment: stood x 2 from bed, second trial pt pivoted with increased time toward L to chair, flexed posture, assist to rise, shift weight and steady, increased time  Ambulation/Gait             General Gait Details: unable at this time.     Stairs            Wheelchair Mobility    Modified Rankin (Stroke Patients Only) Modified Rankin (Stroke Patients Only) Pre-Morbid Rankin Score: No symptoms Modified Rankin: Severe disability     Balance Overall balance assessment: Needs assistance Sitting-balance support: Feet supported Sitting balance-Leahy Scale: Poor Sitting balance - Comments: close S to min A seated EOB about 20 minutes for scooting, brushing teeth, R UE weight bearing, bath, etc; cues for posture and L weight shift throughout due to caving in chest, flexed head/neck and leaning to R   Standing balance support: Single extremity supported Standing balance-Leahy Scale: Poor Standing balance comment: flexed posture, +2 assist with R knee blocked, heavy R side lean                            Cognition Arousal/Alertness: Awake/alert Behavior During Therapy: Flat affect Overall Cognitive Status: Impaired/Different from baseline Area of Impairment: Memory;Problem solving;Safety/judgement                     Memory: Decreased short-term memory Following Commands: Follows one step commands consistently Safety/Judgement: Decreased awareness of safety;Decreased awareness of deficits   Problem Solving: Slow processing;Difficulty sequencing;Requires verbal cues General Comments: did not recall previous OT session this week, continues to demonstrate slower processing speed when attempting to use compensatory strategies      Exercises Other Exercises Other Exercises: stabilized bridging for scooting in bed x 5    General Comments General comments (skin integrity, edema,  etc.): incontinent of urine in brief, assist to change      Pertinent Vitals/Pain Pain Assessment: Faces Faces Pain Scale: Hurts even more Pain Location: R LE Pain Descriptors / Indicators: Other (Comment) (tearing) Pain Intervention(s): Monitored during  session;Repositioned    Home Living                      Prior Function            PT Goals (current goals can now be found in the care plan section) Acute Rehab PT Goals Patient Stated Goal: to get better Progress towards PT goals: Progressing toward goals    Frequency    Min 4X/week      PT Plan Current plan remains appropriate    Co-evaluation PT/OT/SLP Co-Evaluation/Treatment: Yes Reason for Co-Treatment: For patient/therapist safety PT goals addressed during session: Mobility/safety with mobility;Balance;Strengthening/ROM OT goals addressed during session: ADL's and self-care      AM-PAC PT "6 Clicks" Daily Activity  Outcome Measure  Difficulty turning over in bed (including adjusting bedclothes, sheets and blankets)?: Unable Difficulty moving from lying on back to sitting on the side of the bed? : Unable Difficulty sitting down on and standing up from a chair with arms (e.g., wheelchair, bedside commode, etc,.)?: Unable Help needed moving to and from a bed to chair (including a wheelchair)?: A Lot Help needed walking in hospital room?: Total Help needed climbing 3-5 steps with a railing? : Total 6 Click Score: 7    End of Session Equipment Utilized During Treatment: Gait belt Activity Tolerance: Patient tolerated treatment well Patient left: in chair;with call bell/phone within reach;with chair alarm set Nurse Communication: Mobility status;Need for lift equipment PT Visit Diagnosis: Muscle weakness (generalized) (M62.81);Other symptoms and signs involving the nervous system (R29.898);Hemiplegia and hemiparesis Hemiplegia - Right/Left: Right Hemiplegia - dominant/non-dominant: Dominant Hemiplegia - caused by: Cerebral infarction     Time: 1610-9604 PT Time Calculation (min) (ACUTE ONLY): 37 min  Charges:  $Therapeutic Activity: 8-22 mins                    G CodesSheran Lawless, [place] [phone] 10/25/2016    Elray Mcgregor 10/25/2016,  1:09 PM

## 2016-10-25 NOTE — Progress Notes (Signed)
[facility]                                                                              Patient Demographics  Brandon [name], is a 61 y.o. male, DOB - 1955/11/17, ZOX:[id number]  Admit date - 10/17/2016   Admitting Physician Richarda Overlie, MD  Outpatient Primary MD for the patient is Patient, No Pcp Per  Outpatient specialists:   LOS - 8  days   Medical records reviewed and are as summarized below:    Chief Complaint  Patient presents with  . Cerebrovascular Accident       Brief summary   HPI on 10/17/2016 by Dr. Richarda Overlie 61 year old male with a history of depression, alcohol abuse, chronic kidney disease, morbid obesity, found on the floor by his father, EMS was called. Patient noted to have right-sided weakness including right upper right lower extremity weakness, according to the patient onset 48 hours ago prior to presentation. Patient also had a headache but no nausea vomiting. Denied any blurry vision, slurred speech, difficulty swallowing.  Interim history Admitted for CVA and found to have large PFO, Underwent closure procedure on 9/25   Assessment & Plan    Principal Problem:   Acute CVA (cerebral vascular accident) [facility]) - presented with right-sided weakness, headache - MRI of the brain showed multifocal acute to early subacute ischemic infarcts involving bilateral cerebral and left cerebellar hemispheres. - MRA head and neck unremarkable, widely patent carotid artery systems bilaterally without high grade critical flow-limiting stenosis - LDL 111 placed on statin/Lipitor - Hemoglobin A1c 4.7 - 2-D echo showed EF of 60-65%, grade I diastolic dysfunction - Carotid Doppler showed 1-39% ICA stenosis, antegrade vertebral flow - PT recommended CIR, CIR recommended skilled nursing facility - TEE showed a large PFO, lower extremity Dopplers negative for DVT or SVT - continue aspirin 81 mg daily, Plavix - 2-D echo post PFO closure showed normal Weaver  motion, normal systolic function,Atrial septum: An Amplatzer closure device in the fossa ovalis region was present. Doppler showed no shunt, in the baseline state.  Active Problems: Large PFO (patent foramen ovale) - status post closure on 9/25 - Cardiology following, 2-D echo post PFO closure with device in fossa ovalis region, no shunt   Elevated troponin -Likely secondary to acute CVA, currently no chest pain - 2-D echo showed EF of 60-65% with grade 1 diastolic dysfunction - Cardiology recommended aspirin, Plavix, statin  -stress test 9/22 showed EF of 43% with no reversible ischemia or infarction, elevated dilatation and global hypokinesia, intermediate risk  Hyperlipidemia - Continue statin  Leukocytosis - Resolved, chest x-ray, UA unremarkable for infection, no fevers  Acute on Chronic kidney disease stage III - presented with creatinine of 1.8, Baseline creatinine 1.5-1.6 - currently at baseline   Code Status: full  DVT Prophylaxis:   SCD's Family Communication: Discussed in detail with the patient, all imaging results, lab results explained to the patient   Disposition Plan:  SNF available in am   Time Spent in minutes  25 minutes  Procedures:  PFO closure   Consultants:   Cardiology Neurology  Antimicrobials:  Medications  Scheduled Meds: .  stroke: mapping our early stages of recovery book   Does not apply Once  . amLODipine  10 mg Oral Daily  . aspirin  81 mg Oral Daily  . atorvastatin  40 mg Oral q1800  . clopidogrel  75 mg Oral Q breakfast  . heparin  5,000 Units Subcutaneous Q8H  . hydrALAZINE  25 mg Oral Q8H  . metoprolol tartrate  50 mg Oral BID  . senna-docusate  1 tablet Oral BID  . sodium chloride flush  3 mL Intravenous Q12H   Continuous Infusions: . sodium chloride    . sodium chloride 75 mL/hr at 10/25/16 0600   PRN Meds:.sodium chloride, acetaminophen **OR** acetaminophen (TYLENOL) oral liquid 160 mg/5 mL **OR** acetaminophen,  ALPRAZolam, hydrALAZINE, ondansetron (ZOFRAN) IV, sodium chloride flush   Antibiotics   Anti-infectives    Start     Dose/Rate Route Frequency Ordered Stop   10/23/16 1557  ceFAZolin (ANCEF) IVPB 2 g/50 mL premix  Status:  Discontinued     over 30 Minutes  Continuous PRN 10/23/16 1557 10/23/16 1758   10/23/16 1500  ceFAZolin (ANCEF) IVPB 2g/100 mL premix  Status:  Discontinued     2 g 200 mL/hr over 30 Minutes Intravenous On call 10/22/16 1716 10/23/16 2022        Subjective:   Brandon [name] was seen and examined today.  Right-sided weakness persisting, otherwise no complaints. Patient denies dizziness, chest pain, shortness of breath, abdominal pain, N/V/D/C. No acute events overnight.    Objective:   Vitals:   10/24/16 2033 10/25/16 0216 10/25/16 0711 10/25/16 1029  BP: 125/76 136/83    Pulse: 65 (!) 55 73 90  Resp: 18 11    Temp: (!) 97.4 F (36.3 C) 98.5 F (36.9 C) 97.7 F (36.5 C) 97.7 F (36.5 C)  TempSrc: Oral Oral Oral Oral  SpO2: 96% 97% 97%   Weight:  114.9 kg (253 lb 4.9 oz)    Height:        Intake/Output Summary (Last 24 hours) at 10/25/16 1347 Last data filed at 10/25/16 1200  Gross per 24 hour  Intake           838.75 ml  Output              900 ml  Net           -61.25 ml     Wt Readings from Last 3 Encounters:  10/25/16 114.9 kg (253 lb 4.9 oz)  06/04/08 (!) 119.9 kg (264 lb 6.1 oz)  04/22/08 (!) 121.7 kg (268 lb 6.1 oz)     Exam General: Alert and oriented x 3, NAD Eyes:  HEENT:  Atraumatic, normocephalic Cardiovascular: S1 S2 auscultated, no rubs, murmurs or gallops. Regular rate and rhythm. No pedal edema b/l Respiratory: Clear to auscultation bilaterally, no wheezing, rales or rhonchi Gastrointestinal: Soft, nontender, nondistended, + bowel sounds Ext: no pedal edema bilaterally Neuro: right-sided hemiplegia Musculoskeletal: No digital cyanosis, clubbing Skin: No rashes Psych: Normal affect and demeanor, alert and oriented x3       Data Reviewed:  I have personally reviewed following labs and imaging studies  Micro Results Recent Results (from the past 240 hour(s))  Surgical pcr screen     Status: None   Collection Time: 10/23/16  4:23 AM  Result Value Ref Range Status   MRSA, PCR NEGATIVE NEGATIVE Final   Staphylococcus aureus NEGATIVE NEGATIVE Final    Comment: (NOTE) The Xpert SA Assay (FDA  approved for NASAL specimens in patients 59 years of age and older), is one component of a comprehensive surveillance program. It is not intended to diagnose infection nor to guide or monitor treatment.     Radiology Reports Dg Chest 2 View  Result Date: 10/17/2016 CLINICAL DATA:  Uncontrolled hypertension and right-sided weakness EXAM: CHEST  2 VIEW COMPARISON:  04/28/2008 FINDINGS: Cardiac shadow is at the upper limits of normal in size. The lungs are well aerated bilaterally. Previously seen left basilar changes have resolved in the interval. No acute bony abnormality is seen. IMPRESSION: No active cardiopulmonary disease. Electronically Signed   By: Alcide Clever M.D.   On: 10/17/2016 15:17   Dg Ankle 2 Views Right  Result Date: 10/18/2016 CLINICAL DATA:  Right ankle pain.  No injury. EXAM: RIGHT ANKLE - 2 VIEW COMPARISON:  None. FINDINGS: There is no evidence of fracture, dislocation, or joint effusion. The talar dome is intact. The ankle mortise is symmetric. Achilles and plantar enthesopathy. Dorsal spurring of the talar neck. Bone mineralization is normal. Soft tissues are unremarkable. IMPRESSION: No acute osseous abnormality. Electronically Signed   By: Obie Dredge M.D.   On: 10/18/2016 13:36   Ct Head Wo Contrast  Result Date: 10/17/2016 CLINICAL DATA:  Right-sided weakness and facial droop with leaning. Symptoms since waking. EXAM: CT HEAD WITHOUT CONTRAST TECHNIQUE: Contiguous axial images were obtained from the base of the skull through the vertex without intravenous contrast. COMPARISON:  None.  FINDINGS: Brain: No evidence of acute cortical infarction. No hemorrhage, hydrocephalus, or masslike findings. There is advanced small-vessel type ischemic injury in the cerebral white matter and likely right pons. In general these changes are chronic, but specific areas of insults are age-indeterminate by CT. Small remote appearing high right frontal cortex infarct. Vascular: Atherosclerotic calcification. Vessels appear large in tortuous, which can be a sign of chronic hypertension. Skull: No acute or aggressive finding. Sinuses/Orbits: Negative. IMPRESSION: 1. No acute hemorrhage or definite acute infarct. 2. Advanced chronic small vessel ischemia which could easily obscure an acute white matter or deep gray nucleus infarct. 3. Small remote appearing right frontal cortex infarct. Electronically Signed   By: Marnee Spring M.D.   On: 10/17/2016 09:42   Mr Maxine Glenn Head Wo Contrast  Result Date: 10/17/2016 CLINICAL DATA:  Initial evaluation for acute right-sided weakness, right-sided facial droop. EXAM: MRI HEAD WITHOUT AND WITH CONTRAST MRA HEAD WITHOUT CONTRAST MRA NECK WITHOUT AND WITH CONTRAST TECHNIQUE: Multiplanar, multiecho pulse sequences of the brain and surrounding structures were obtained without intravenous contrast. Angiographic images of the Circle of Willis were obtained using MRA technique without intravenous contrast. Angiographic images of the neck were obtained using MRA technique without and with intravenous contrast. Carotid stenosis measurements (when applicable) are obtained utilizing NASCET criteria, using the distal internal carotid diameter as the denominator. CONTRAST:  20mL MULTIHANCE GADOBENATE DIMEGLUMINE 529 MG/ML IV SOLN COMPARISON:  Prior CT from earlier same day. FINDINGS: MRI HEAD FINDINGS Study degraded by motion artifact. Diffuse prominence of the CSF containing spaces compatible with generalized cerebral atrophy. Extensive patchy and confluent T2/FLAIR hyperintensity within the  periventricular and deep white matter both cerebral hemispheres, consistent with chronic microvascular disease. Superimposed remote lacunar infarcts present within the bilateral basal ganglia as well as the periventricular and deep white matter both cerebral hemispheres. Remote lacunar infarct present within the pons and middle cerebellar peduncle is as well. Approximate 2 cm acute ischemic infarct seen involving the posterior limb of the left and sternal capsule extending  into the posterior left corona radiata (series 3, image 27). Patchy extension into the left subinsular white matter (series 3, image 24). Additional 5 mm ischemic infarct at the posterior left mesial temporal lobe (series 3, image 21). Few additional punctate cortical and subcortical infarcts seen involving the right cerebral hemisphere (series 3, image 33, 16) as well as the superior left cerebral hemisphere (series 3, image 35). Probable additional acute/early subacute punctate left cerebellar infarct (series 7, image 14). No associated hemorrhage or mass effect. The no evidence for acute intracranial hemorrhage. Single subcentimeter chronic microhemorrhage noted at the right external capsule (series 10, image 16). No mass lesion, midline shift or mass effect. No hydrocephalus. No extra-axial fluid collection. Major dural sinuses patent. No abnormal enhancement. Pituitary gland normal. Right vertebral artery diminutive and not well visualized. Major intracranial vascular flow voids otherwise maintained. Craniocervical junction normal. Upper cervical spine within normal limits. Bone marrow signal intensity normal. No scalp soft tissue abnormality. Globes and orbital soft tissues within normal limits. Mild mucosal thickening within the ethmoidal air cells and maxillary sinuses. Superimposed small left maxillary sinus retention cyst noted. Mastoids are clear. Inner ear structures normal. MRA HEAD FINDINGS ANTERIOR CIRCULATION: Study moderately  degraded by motion artifact. Petrous, cavernous, and supraclinoid segments of the internal carotid arteries are patent without flow-limiting stenosis. Origin of the ophthalmic arteries patent bilaterally. ICA termini widely patent. A1 segments patent bilaterally. Anterior communicating artery not well seen on this motion degraded exam. There is an apparent focal severe stenosis of the mid-distal left A2 segment (series 6, image 116). Possible additional focal moderate stenosis involving the distal right A2 segment (series 6, image 133). M1 segments patent without stenosis or occlusion. MCA bifurcations normal. No obvious proximal M2 occlusion. Possible short-segment moderate to severe proximal left M2 stenosis noted (series 604, image 13). Distal MCA branches not well evaluated on this motion degraded exam, but are grossly symmetric and well perfused. POSTERIOR CIRCULATION: Left vertebral artery dominant and widely patent to the vertebrobasilar junction. Right vertebral artery diffusely hypoplastic and patent as well without obvious stenosis. Origin of the posterior inferior cerebral arteries not seen on this exam. Basilar artery mildly tortuous but widely patent to its distal aspect without stenosis. Superior cerebral arteries patent proximally. Both of the posterior cerebral arteries primarily supplied via the basilar. PCAs widely patent proximally, not well evaluated distally due to motion. No aneurysm or vascular malformation. MRA NECK FINDINGS Source images reviewed. Visualized aortic arch of normal caliber with normal 3 vessel morphology. No flow-limiting stenosis about the origin of the great vessels. Visualized subclavian arteries widely patent. Right common and internal carotid artery is widely patent within the neck without high-grade or critical stenosis. No significant atheromatous narrowing about the right carotid bifurcation. Left common and internal carotid artery's widely patent within the neck  without high-grade or critical stenosis. Mild atheromatous irregularity about the left carotid bifurcation without flow-limiting stenosis. Both vertebral arteries arise from the subclavian arteries. Left vertebral artery dominant with a diffusely hypoplastic right vertebral artery. Short-segment moderate stenosis at the proximal right P1 segment (series 40981, image 10). Mild atheromatous irregularity within the distal right V2/V3 segments. Pre foraminal left V1 segment tortuous. No other high-grade or flow-limiting stenosis within the vertebral arteries bilaterally. IMPRESSION: MRI HEAD IMPRESSION: 1. Multifocal acute to early subacute ischemic infarcts involving the bilateral cerebral and left cerebellar hemispheres as above, most prominent of which is a 2 cm infarct at the posterior left lentiform nucleus/corona radiata. No associated hemorrhage or mass  effect. Possible central thromboembolic etiology suspected given the various vascular distributions involved. 2. Generalized age-related cerebral atrophy with advanced chronic microvascular ischemic disease. MRA HEAD IMPRESSION: 1. Negative intracranial MRA. No large or proximal arterial branch occlusion. No high-grade or correctable stenosis. 2. Question short-segment moderate to severe bilateral A2 and proximal left M2 stenoses, somewhat limited in evaluation due to extensive motion artifact on this exam. MRA NECK IMPRESSION: 1. Widely patent carotid artery systems bilaterally without high-grade or critical flow limiting stenosis. 2. Short-segment moderate proximal right V1 stenosis. Vertebral arteries otherwise widely patent within the neck. Left vertebral artery is dominant with a diffusely hypoplastic right vertebral artery. Electronically Signed   By: Rise Mu M.D.   On: 10/17/2016 15:24   Mr Angiogram Neck W Or Wo Contrast  Result Date: 10/17/2016 CLINICAL DATA:  Initial evaluation for acute right-sided weakness, right-sided facial droop.  EXAM: MRI HEAD WITHOUT AND WITH CONTRAST MRA HEAD WITHOUT CONTRAST MRA NECK WITHOUT AND WITH CONTRAST TECHNIQUE: Multiplanar, multiecho pulse sequences of the brain and surrounding structures were obtained without intravenous contrast. Angiographic images of the Circle of Willis were obtained using MRA technique without intravenous contrast. Angiographic images of the neck were obtained using MRA technique without and with intravenous contrast. Carotid stenosis measurements (when applicable) are obtained utilizing NASCET criteria, using the distal internal carotid diameter as the denominator. CONTRAST:  20mL MULTIHANCE GADOBENATE DIMEGLUMINE 529 MG/ML IV SOLN COMPARISON:  Prior CT from earlier same day. FINDINGS: MRI HEAD FINDINGS Study degraded by motion artifact. Diffuse prominence of the CSF containing spaces compatible with generalized cerebral atrophy. Extensive patchy and confluent T2/FLAIR hyperintensity within the periventricular and deep white matter both cerebral hemispheres, consistent with chronic microvascular disease. Superimposed remote lacunar infarcts present within the bilateral basal ganglia as well as the periventricular and deep white matter both cerebral hemispheres. Remote lacunar infarct present within the pons and middle cerebellar peduncle is as well. Approximate 2 cm acute ischemic infarct seen involving the posterior limb of the left and sternal capsule extending into the posterior left corona radiata (series 3, image 27). Patchy extension into the left subinsular white matter (series 3, image 24). Additional 5 mm ischemic infarct at the posterior left mesial temporal lobe (series 3, image 21). Few additional punctate cortical and subcortical infarcts seen involving the right cerebral hemisphere (series 3, image 33, 16) as well as the superior left cerebral hemisphere (series 3, image 35). Probable additional acute/early subacute punctate left cerebellar infarct (series 7, image 14). No  associated hemorrhage or mass effect. The no evidence for acute intracranial hemorrhage. Single subcentimeter chronic microhemorrhage noted at the right external capsule (series 10, image 16). No mass lesion, midline shift or mass effect. No hydrocephalus. No extra-axial fluid collection. Major dural sinuses patent. No abnormal enhancement. Pituitary gland normal. Right vertebral artery diminutive and not well visualized. Major intracranial vascular flow voids otherwise maintained. Craniocervical junction normal. Upper cervical spine within normal limits. Bone marrow signal intensity normal. No scalp soft tissue abnormality. Globes and orbital soft tissues within normal limits. Mild mucosal thickening within the ethmoidal air cells and maxillary sinuses. Superimposed small left maxillary sinus retention cyst noted. Mastoids are clear. Inner ear structures normal. MRA HEAD FINDINGS ANTERIOR CIRCULATION: Study moderately degraded by motion artifact. Petrous, cavernous, and supraclinoid segments of the internal carotid arteries are patent without flow-limiting stenosis. Origin of the ophthalmic arteries patent bilaterally. ICA termini widely patent. A1 segments patent bilaterally. Anterior communicating artery not well seen on this motion degraded exam. There  is an apparent focal severe stenosis of the mid-distal left A2 segment (series 6, image 116). Possible additional focal moderate stenosis involving the distal right A2 segment (series 6, image 133). M1 segments patent without stenosis or occlusion. MCA bifurcations normal. No obvious proximal M2 occlusion. Possible short-segment moderate to severe proximal left M2 stenosis noted (series 604, image 13). Distal MCA branches not well evaluated on this motion degraded exam, but are grossly symmetric and well perfused. POSTERIOR CIRCULATION: Left vertebral artery dominant and widely patent to the vertebrobasilar junction. Right vertebral artery diffusely hypoplastic  and patent as well without obvious stenosis. Origin of the posterior inferior cerebral arteries not seen on this exam. Basilar artery mildly tortuous but widely patent to its distal aspect without stenosis. Superior cerebral arteries patent proximally. Both of the posterior cerebral arteries primarily supplied via the basilar. PCAs widely patent proximally, not well evaluated distally due to motion. No aneurysm or vascular malformation. MRA NECK FINDINGS Source images reviewed. Visualized aortic arch of normal caliber with normal 3 vessel morphology. No flow-limiting stenosis about the origin of the great vessels. Visualized subclavian arteries widely patent. Right common and internal carotid artery is widely patent within the neck without high-grade or critical stenosis. No significant atheromatous narrowing about the right carotid bifurcation. Left common and internal carotid artery's widely patent within the neck without high-grade or critical stenosis. Mild atheromatous irregularity about the left carotid bifurcation without flow-limiting stenosis. Both vertebral arteries arise from the subclavian arteries. Left vertebral artery dominant with a diffusely hypoplastic right vertebral artery. Short-segment moderate stenosis at the proximal right P1 segment (series 16109, image 10). Mild atheromatous irregularity within the distal right V2/V3 segments. Pre foraminal left V1 segment tortuous. No other high-grade or flow-limiting stenosis within the vertebral arteries bilaterally. IMPRESSION: MRI HEAD IMPRESSION: 1. Multifocal acute to early subacute ischemic infarcts involving the bilateral cerebral and left cerebellar hemispheres as above, most prominent of which is a 2 cm infarct at the posterior left lentiform nucleus/corona radiata. No associated hemorrhage or mass effect. Possible central thromboembolic etiology suspected given the various vascular distributions involved. 2. Generalized age-related cerebral  atrophy with advanced chronic microvascular ischemic disease. MRA HEAD IMPRESSION: 1. Negative intracranial MRA. No large or proximal arterial branch occlusion. No high-grade or correctable stenosis. 2. Question short-segment moderate to severe bilateral A2 and proximal left M2 stenoses, somewhat limited in evaluation due to extensive motion artifact on this exam. MRA NECK IMPRESSION: 1. Widely patent carotid artery systems bilaterally without high-grade or critical flow limiting stenosis. 2. Short-segment moderate proximal right V1 stenosis. Vertebral arteries otherwise widely patent within the neck. Left vertebral artery is dominant with a diffusely hypoplastic right vertebral artery. Electronically Signed   By: Rise Mu M.D.   On: 10/17/2016 15:24   Mr Laqueta Jean UE Contrast  Result Date: 10/17/2016 CLINICAL DATA:  Initial evaluation for acute right-sided weakness, right-sided facial droop. EXAM: MRI HEAD WITHOUT AND WITH CONTRAST MRA HEAD WITHOUT CONTRAST MRA NECK WITHOUT AND WITH CONTRAST TECHNIQUE: Multiplanar, multiecho pulse sequences of the brain and surrounding structures were obtained without intravenous contrast. Angiographic images of the Circle of Willis were obtained using MRA technique without intravenous contrast. Angiographic images of the neck were obtained using MRA technique without and with intravenous contrast. Carotid stenosis measurements (when applicable) are obtained utilizing NASCET criteria, using the distal internal carotid diameter as the denominator. CONTRAST:  20mL MULTIHANCE GADOBENATE DIMEGLUMINE 529 MG/ML IV SOLN COMPARISON:  Prior CT from earlier same day. FINDINGS: MRI HEAD FINDINGS  Study degraded by motion artifact. Diffuse prominence of the CSF containing spaces compatible with generalized cerebral atrophy. Extensive patchy and confluent T2/FLAIR hyperintensity within the periventricular and deep white matter both cerebral hemispheres, consistent with chronic  microvascular disease. Superimposed remote lacunar infarcts present within the bilateral basal ganglia as well as the periventricular and deep white matter both cerebral hemispheres. Remote lacunar infarct present within the pons and middle cerebellar peduncle is as well. Approximate 2 cm acute ischemic infarct seen involving the posterior limb of the left and sternal capsule extending into the posterior left corona radiata (series 3, image 27). Patchy extension into the left subinsular white matter (series 3, image 24). Additional 5 mm ischemic infarct at the posterior left mesial temporal lobe (series 3, image 21). Few additional punctate cortical and subcortical infarcts seen involving the right cerebral hemisphere (series 3, image 33, 16) as well as the superior left cerebral hemisphere (series 3, image 35). Probable additional acute/early subacute punctate left cerebellar infarct (series 7, image 14). No associated hemorrhage or mass effect. The no evidence for acute intracranial hemorrhage. Single subcentimeter chronic microhemorrhage noted at the right external capsule (series 10, image 16). No mass lesion, midline shift or mass effect. No hydrocephalus. No extra-axial fluid collection. Major dural sinuses patent. No abnormal enhancement. Pituitary gland normal. Right vertebral artery diminutive and not well visualized. Major intracranial vascular flow voids otherwise maintained. Craniocervical junction normal. Upper cervical spine within normal limits. Bone marrow signal intensity normal. No scalp soft tissue abnormality. Globes and orbital soft tissues within normal limits. Mild mucosal thickening within the ethmoidal air cells and maxillary sinuses. Superimposed small left maxillary sinus retention cyst noted. Mastoids are clear. Inner ear structures normal. MRA HEAD FINDINGS ANTERIOR CIRCULATION: Study moderately degraded by motion artifact. Petrous, cavernous, and supraclinoid segments of the internal  carotid arteries are patent without flow-limiting stenosis. Origin of the ophthalmic arteries patent bilaterally. ICA termini widely patent. A1 segments patent bilaterally. Anterior communicating artery not well seen on this motion degraded exam. There is an apparent focal severe stenosis of the mid-distal left A2 segment (series 6, image 116). Possible additional focal moderate stenosis involving the distal right A2 segment (series 6, image 133). M1 segments patent without stenosis or occlusion. MCA bifurcations normal. No obvious proximal M2 occlusion. Possible short-segment moderate to severe proximal left M2 stenosis noted (series 604, image 13). Distal MCA branches not well evaluated on this motion degraded exam, but are grossly symmetric and well perfused. POSTERIOR CIRCULATION: Left vertebral artery dominant and widely patent to the vertebrobasilar junction. Right vertebral artery diffusely hypoplastic and patent as well without obvious stenosis. Origin of the posterior inferior cerebral arteries not seen on this exam. Basilar artery mildly tortuous but widely patent to its distal aspect without stenosis. Superior cerebral arteries patent proximally. Both of the posterior cerebral arteries primarily supplied via the basilar. PCAs widely patent proximally, not well evaluated distally due to motion. No aneurysm or vascular malformation. MRA NECK FINDINGS Source images reviewed. Visualized aortic arch of normal caliber with normal 3 vessel morphology. No flow-limiting stenosis about the origin of the great vessels. Visualized subclavian arteries widely patent. Right common and internal carotid artery is widely patent within the neck without high-grade or critical stenosis. No significant atheromatous narrowing about the right carotid bifurcation. Left common and internal carotid artery's widely patent within the neck without high-grade or critical stenosis. Mild atheromatous irregularity about the left carotid  bifurcation without flow-limiting stenosis. Both vertebral arteries arise from the subclavian arteries.  Left vertebral artery dominant with a diffusely hypoplastic right vertebral artery. Short-segment moderate stenosis at the proximal right P1 segment (series 16109, image 10). Mild atheromatous irregularity within the distal right V2/V3 segments. Pre foraminal left V1 segment tortuous. No other high-grade or flow-limiting stenosis within the vertebral arteries bilaterally. IMPRESSION: MRI HEAD IMPRESSION: 1. Multifocal acute to early subacute ischemic infarcts involving the bilateral cerebral and left cerebellar hemispheres as above, most prominent of which is a 2 cm infarct at the posterior left lentiform nucleus/corona radiata. No associated hemorrhage or mass effect. Possible central thromboembolic etiology suspected given the various vascular distributions involved. 2. Generalized age-related cerebral atrophy with advanced chronic microvascular ischemic disease. MRA HEAD IMPRESSION: 1. Negative intracranial MRA. No large or proximal arterial branch occlusion. No high-grade or correctable stenosis. 2. Question short-segment moderate to severe bilateral A2 and proximal left M2 stenoses, somewhat limited in evaluation due to extensive motion artifact on this exam. MRA NECK IMPRESSION: 1. Widely patent carotid artery systems bilaterally without high-grade or critical flow limiting stenosis. 2. Short-segment moderate proximal right V1 stenosis. Vertebral arteries otherwise widely patent within the neck. Left vertebral artery is dominant with a diffusely hypoplastic right vertebral artery. Electronically Signed   By: Rise Mu M.D.   On: 10/17/2016 15:24   Nm Myocar Single W/planar W/Schwer Motion And Ef  Result Date: 10/20/2016 CLINICAL DATA:  61 year old male with chest pain. EXAM: MYOCARDIAL IMAGING WITH SPECT (REST AND PHARMACOLOGIC-STRESS) GATED LEFT VENTRICULAR [place]bard MOTION STUDY LEFT VENTRICULAR  EJECTION FRACTION TECHNIQUE: Standard myocardial SPECT imaging was performed after resting intravenous injection of 10 mCi Tc-55m tetrofosmin. Subsequently, intravenous infusion of Lexiscan was performed under the supervision of the Cardiology staff. At peak effect of the drug, 30 mCi Tc-58m tetrofosmin was injected intravenously and standard myocardial SPECT imaging was performed. Quantitative gated imaging was also performed to evaluate left ventricular Berkemeier motion, and estimate left ventricular ejection fraction. COMPARISON:  Chest radiograph 10/17/2016 FINDINGS: Perfusion: No decreased activity in the left ventricle on stress imaging to suggest reversible ischemia or infarction. Grewell Motion: The LEFT ventricle is dilated at rest and stress. Global hypokinesia. No focal Gassman motion abnormality Left Ventricular Ejection Fraction: 43 % End diastolic volume 150 ml End systolic volume 86 ml IMPRESSION: 1. No reversible ischemia or infarction. 2. LEFT ventricle dilatation and global hypokinesia. 3. Left ventricular ejection fraction 43% 4. Non invasive risk stratification*: Intermediate (classification due to low ejection fraction) *2012 Appropriate Use Criteria for Coronary Revascularization Focused Update: J Am Coll Cardiol. 2012;59(9):857-881. http://content.dementiazones.com.aspx?articleid=[email or web address] Electronically Signed   By: Genevive Bi M.D.   On: 10/20/2016 12:35    Lab Data:  CBC:  Recent Labs Lab 10/19/16 0311 10/20/16 0604 10/21/16 0523 10/23/16 0533 10/24/16 0301  WBC 9.2 7.4 9.4 9.1 9.3  HGB 14.9 14.9 16.6 16.1 17.0  HCT 42.7 43.5 48.4 46.2 48.6  MCV 95.7 97.8 98.2 97.1 97.4  PLT 205 196 199 195 157   Basic Metabolic Panel:  Recent Labs Lab 10/20/16 0604 10/21/16 0523 10/23/16 0533 10/24/16 0301 10/25/16 0215  NA 140 139 137 136 134*  K 3.7 3.9 4.0 4.2 4.1  CL 112* 109 106 105 103  CO2 22 22 22  21* 26  GLUCOSE 101* 85 94 98 96  BUN 20 20 25* 26* 24*  CREATININE  1.51* 1.56* 1.58* 1.61* 1.65*  CALCIUM 8.6* 8.9 8.8* 8.8* 8.7*   GFR: Estimated Creatinine Clearance: 61.3 mL/min (A) (by C-G formula based on SCr of 1.65 mg/dL (H)). Liver Function Tests: No  results for input(s): AST, ALT, ALKPHOS, BILITOT, PROT, ALBUMIN in the last 168 hours. No results for input(s): LIPASE, AMYLASE in the last 168 hours. No results for input(s): AMMONIA in the last 168 hours. Coagulation Profile: No results for input(s): INR, PROTIME in the last 168 hours. Cardiac Enzymes:  Recent Labs Lab 10/18/16 1452 10/18/16 2044 10/19/16 0311 10/20/16 0604  CKTOTAL  --   --  116 85  TROPONINI 0.10* 0.10* 0.11*  --    BNP (last 3 results) No results for input(s): PROBNP in the last 8760 hours. HbA1C: No results for input(s): HGBA1C in the last 72 hours. CBG: No results for input(s): GLUCAP in the last 168 hours. Lipid Profile: No results for input(s): CHOL, HDL, LDLCALC, TRIG, CHOLHDL, LDLDIRECT in the last 72 hours. Thyroid Function Tests: No results for input(s): TSH, T4TOTAL, FREET4, T3FREE, THYROIDAB in the last 72 hours. Anemia Panel: No results for input(s): VITAMINB12, FOLATE, FERRITIN, TIBC, IRON, RETICCTPCT in the last 72 hours. Urine analysis:    Component Value Date/Time   COLORURINE YELLOW 10/17/2016 0836   APPEARANCEUR HAZY (A) 10/17/2016 0836   LABSPEC 1.023 10/17/2016 0836   PHURINE 5.0 10/17/2016 0836   GLUCOSEU NEGATIVE 10/17/2016 0836   HGBUR NEGATIVE 10/17/2016 0836   BILIRUBINUR NEGATIVE 10/17/2016 0836   KETONESUR 5 (A) 10/17/2016 0836   PROTEINUR 100 (A) 10/17/2016 0836   UROBILINOGEN 1.0 04/08/2008 2101   NITRITE NEGATIVE 10/17/2016 0836   LEUKOCYTESUR NEGATIVE 10/17/2016 0836     Kamin Niblack M.D. [facility] 10/25/2016, 1:47 PM  Pager: [phone] Between 7am to 7pm - call Pager - [phone]  After 7pm go to [email or web address] - password [email or web address]  Call night coverage person covering after 7pm

## 2016-10-25 NOTE — Progress Notes (Signed)
Subjective:  No complaints. Still unable to move right upper and lower extremities. S/p PFO closure 10/23/16  Objective:  Vital Signs in the last 24 hours: Temp:  [97.4 F (36.3 C)-99.5 F (37.5 C)] 98.5 F (36.9 C) (09/27 0216) Pulse Rate:  [55-105] 55 (09/27 0216) Resp:  [11-18] 11 (09/27 0216) BP: (125-139)/(76-97) 136/83 (09/27 0216) SpO2:  [96 %-97 %] 97 % (09/27 0216) Weight:  [114.9 kg (253 lb 4.9 oz)] 114.9 kg (253 lb 4.9 oz) (09/27 0216)  Intake/Output from previous day: 09/26 0701 - 09/27 0700 In: 1078.8 [P.O.:480; I.V.:598.8] Out: 1000 [Urine:1000] Intake/Output from this shift: No intake/output data recorded.  Physical Exam: General appearance: alert, cooperative, no distress and mildly obese Eyes: negative findings: lids and lashes normal Neck: no adenopathy, no carotid bruit, no JVD, supple, symmetrical, trachea midline and thyroid not enlarged, symmetric, no tenderness/mass/nodules Neck: JVP - normal, carotids 2+= without bruits Resp: clear to auscultation bilaterally Chest Gipe: no tenderness Cardio: regular rate and rhythm, S1, S2 normal, no murmur, click, rub or gallop. Rt groin site stable. No hematoma GI: soft, non-tender; bowel sounds normal; no masses,  no organomegaly Extremities: extremities normal, atraumatic, no cyanosis or edema and Neurologic: Dense right hemiplegia noted.  Vascular: ll the peripheral pulses are normal, Bilateral DP is faint. Probably normal variant.   Lab Results:  Recent Labs  10/23/16 0533 10/24/16 0301  WBC 9.1 9.3  HGB 16.1 17.0  PLT 195 157    Recent Labs  10/24/16 0301 10/25/16 0215  NA 136 134*  K 4.2 4.1  CL 105 103  CO2 21* 26  GLUCOSE 98 96  BUN 26* 24*  CREATININE 1.61* 1.65*   Imaging: MRA HEAD IMPRESSION:  1. Negative intracranial MRA. No large or proximal arterial branch occlusion. No high-grade or correctable stenosis. 2. Question short-segment moderate to severe bilateral A2 and proximal  left M2 stenoses, somewhat limited in evaluation due to extensive motion artifact on this exam.  MRA NECK IMPRESSION:  1. Widely patent carotid artery systems bilaterally without high-grade or critical flow limiting stenosis. 2. Short-segment moderate proximal right V1 stenosis. Vertebral arteries otherwise widely patent within the neck. 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  LE venous Doppler Summary: No evidence of deep vein or superficial thrombosis involving the right lower extremity and left lower extremity.  Other specific details can be found in the table(s) above. Prepared and Electronically Authenticated by  Charlena Cross, MD 2018-09-23T19:17:36   Cardiac Studies: TEE 10/19/2016 Large PFO seen.   10/20/2016: SPECT MPI IMPRESSION: 1. No reversible ischemia or infarction.  2. LEFT ventricle dilatation and global hypokinesia.  3. Left ventricular ejection fraction 43%  4. Non invasive risk stratification*: Intermediate (classification due to low ejection fraction)  Assessment: Right-sided hemiplegia, acute ischemic CVA Mild troponin elevation: Supply demand ischemia type 2 MI, in the setting of the above. No ischemia/infarct on stress test Large PFO, possible cause for cardioembolic CVA. S/p Successful closure of the atrial septal defect/PFO under intracardiac echo guidance with implantation of a  ASD with a 25 mm Amplatzer PFO Septal Occluder. (10/23/2016) Post-deployment double contrast study revealed No residual shunting. Formal echocardiogram pending Hyperlipdemia  Recommendations: Continue ASA 81 mg, plavix 75 mg for at least 3 months No indication for cardiac catherization at this time. Recommend medical management for possible CAD. Continue hypertension and diabetes control as you are doing.  Continue statin  Will review echocardiogram. Cardiology will sign off. Follow up scheduled.  LOS: 8 days    Nechemia Chiappetta J Myrtha Tonkovich 10/25/2016, 7:33 AM  Elder Negus, MD [facility] [place] Pager: [phone] Office: [phone] If no answer Cell [phone]

## 2016-10-25 NOTE — Progress Notes (Addendum)
CSW confirmed with Chantel Little from [facility] and Rehab and they are able to offer pt a bed per Chantel. CSW received a VM as well as spoke with Wandra Mannan Engineer, petroleum). Per Timothy Lasso, a Medicaid application for pt has already been started however Zack confimred that he will do a 30 day LOG and will extend the LOG if needed for pt. Pt to discharge to [facility] and [facility]hab on 10/26/16. CSW to call transport and give RN information to call in report when appropriate.    Claude Manges Delma Villalva, MSW, LCSW-A Emergency Department Clinical Social Worker [phone]

## 2016-10-25 NOTE — Clinical Social Work Note (Signed)
Clinical Social Work Assessment  Patient Details  Name: Brandon [name] MRN: [id number] Date of Birth: 1955-06-05  Date of referral:  10/25/16               Reason for consult:  Facility Placement                Permission sought to share information with:  Case Manager Permission granted to share information::     Name::     Letha Cape  Agency::     Relationship::     Contact Information:     Housing/Transportation Living arrangements for the past 2 months:  Skilled Building surveyor of Information:  Patient Patient Interpreter Needed:  None Criminal Activity/Legal Involvement Pertinent to Current Situation/Hospitalization:  No - Comment as needed Significant Relationships:  None Lives with:  Parents (older father. ) Do you feel safe going back to the place where you live?  Yes (after SNF. ) Need for family participation in patient care:     Care giving concerns:  CSW spoke with pt at bedside. During this time pt expressed no further concerns to CSW.    Social Worker assessment / plan: CSW spoke with pt at bedside. During this time CSW was informed that pt is from home with father. Pt informed CSW that father is unable to care for pt as he has his own health needs. CSW spoke with pt about other supports and pt expresses that pt has none. Pt is planning to go to SNF on an LOG and then back home with father.   Employment status:  Other (Comment) (unknown ) Insurance information:  Self Pay (Medicaid Pending) PT Recommendations:  Skilled Nursing Facility Information / Referral to community resources:  Skilled Nursing Facility  Patient/Family's Response to care:  Pt is understanding and agreeable to plan of care.   Patient/Family's Understanding of and Emotional Response to Diagnosis, Current Treatment, and Prognosis:  No further questions or concerns have been presented to CSW at this time.   Emotional Assessment Appearance:    Attitude/Demeanor/Rapport:    Affect  (typically observed):  Pleasant Orientation:  Oriented to Place, Oriented to Self, Oriented to  Time, Oriented to Situation Alcohol / Substance use:  Not Applicable Psych involvement (Current and /or in the community):  No (Comment)  Discharge Needs  Concerns to be addressed:  No discharge needs identified Readmission within the last 30 days:  No Current discharge risk:  None Barriers to Discharge:  No Barriers Identified   Robb Matar, LCSWA 10/25/2016, 12:23 PM

## 2016-10-25 NOTE — Progress Notes (Signed)
CSW spoke with Chantel Little from [facility] and Rehab about a bed for pt. CSW was informed by clinical supervisor Timothy Lasso) that he is willing to do a 30 day LOG at this time. CSW is reaching ou tot other facilities for beds. CSW will continue to follow.    Claude Manges Chanah Tidmore, MSW, LCSW-A Emergency Department Clinical Social Worker [phone]

## 2016-10-25 NOTE — Progress Notes (Signed)
Occupational Therapy Treatment Patient Details Name: Brandon [name] MRN: [id number] DOB: 05-29-1955 Today's Date: 10/25/2016    History of present illness 61 year old male with a history of depression, alcohol abuse, chronic kidney disease, morbid obesity, found on the floor by his father. Imaging revealed Multifocal acute to early subacute ischemic infarcts involving he bilateral cerebral and left cerebellar hemispheres.   OT comments  Pt demonstrating improved trunk control at EOB, but needs verbal cues for posture when involved in grooming activity in unsupported sitting at EOB. Pt continues to require 2 person assist for standing and pivot transfers with flexed posture. Poor awareness of deficits/safety. Updated d/c plan to SNF as per CIR note.   Follow Up Recommendations  SNF;Supervision/Assistance - 24 hour    Equipment Recommendations       Recommendations for Other Services      Precautions / Restrictions Precautions Precautions: Fall Precaution Comments: right sided weakness Restrictions Weight Bearing Restrictions: No       Mobility Bed Mobility Overal bed mobility: Needs Assistance             General bed mobility comments: see PT note  Transfers Overall transfer level: Needs assistance Equipment used: 2 person hand held assist Transfers: Sit to/from Stand;Stand Pivot Transfers Sit to Stand: +2 physical assistance;Mod assist;From elevated surface Stand pivot transfers: +2 physical assistance;Mod assist       General transfer comment: stood x 2 from bed, second trial pt pivoted with increased time toward L to chair, flexed posture, assist to rise, shift weight and steady, increased time    Balance Overall balance assessment: Needs assistance   Sitting balance-Leahy Scale: Poor Sitting balance - Comments: cues for posture, no overt LOB, but balance requires increased concentration and attention   Standing balance support: Single extremity  supported Standing balance-Leahy Scale: Poor Standing balance comment: flexed posture, +2 assist with R knee blocked, heavy R side lean                           ADL either performed or assessed with clinical judgement   ADL Overall ADL's : Needs assistance/impaired     Grooming: Wash/dry face;Oral care;Brushing hair;Minimal assistance;Sitting Grooming Details (indicate cue type and reason): EOB and in chair, cues to use mouth to open toothpaste/tooth brush package Upper Body Bathing: Maximal assistance;Sitting       Upper Body Dressing : Maximal assistance;Sitting   Lower Body Dressing: Total assistance;Sitting/lateral leans Lower Body Dressing Details (indicate cue type and reason): socks     Toileting- Clothing Manipulation and Hygiene: Total assistance;+2 for physical assistance;Sit to/from stand Toileting - Clothing Manipulation Details (indicate cue type and reason): pt with urinary incontinence       General ADL Comments: Pt requiring verbal cues to maintain upright trunk during grooming activity at EOB. Pt propping on R UE, sat EOB with min guard to min assist x 15 minutes     Vision       Perception     Praxis      Cognition Arousal/Alertness: Awake/alert Behavior During Therapy: Flat affect Overall Cognitive Status: Impaired/Different from baseline Area of Impairment: Memory;Problem solving;Safety/judgement                     Memory: Decreased short-term memory Following Commands: Follows one step commands consistently Safety/Judgement: Decreased awareness of safety;Decreased awareness of deficits   Problem Solving: Slow processing;Difficulty sequencing;Requires verbal cues General Comments: did not recall previous OT session  this week, continues to demonstrate slower processing speed when attempting to use compensatory strategies        Exercises     Shoulder Instructions       General Comments      Pertinent Vitals/  Pain       Pain Assessment: Faces Faces Pain Scale: Hurts even more Pain Location: R LE Pain Descriptors / Indicators:  (tearing) Pain Intervention(s): Monitored during session;Repositioned  Home Living                                          Prior Functioning/Environment              Frequency  Min 3X/week        Progress Toward Goals  OT Goals(current goals can now be found in the care plan section)  Progress towards OT goals: Progressing toward goals  Acute Rehab OT Goals Patient Stated Goal: to get better OT Goal Formulation: With patient Time For Goal Achievement: 11/01/16 Potential to Achieve Goals: Good  Plan Discharge plan needs to be updated    Co-evaluation    PT/OT/SLP Co-Evaluation/Treatment: Yes Reason for Co-Treatment: For patient/therapist safety   OT goals addressed during session: ADL's and self-care      AM-PAC PT "6 Clicks" Daily Activity     Outcome Measure   Help from another person eating meals?: A Little Help from another person taking care of personal grooming?: A Little Help from another person toileting, which includes using toliet, bedpan, or urinal?: Total Help from another person bathing (including washing, rinsing, drying)?: A Lot Help from another person to put on and taking off regular upper body clothing?: A Lot Help from another person to put on and taking off regular lower body clothing?: Total 6 Click Score: 12    End of Session Equipment Utilized During Treatment: Gait belt  OT Visit Diagnosis: Unsteadiness on feet (R26.81);Other abnormalities of gait and mobility (R26.89);Hemiplegia and hemiparesis Hemiplegia - Right/Left: Right Hemiplegia - dominant/non-dominant: Dominant Hemiplegia - caused by: Cerebral infarction Pain - Right/Left: Right Pain - part of body: Leg   Activity Tolerance Patient tolerated treatment well   Patient Left in chair;with call bell/phone within reach;with chair alarm  set;Other (comment) (lift pad in place)   Nurse Communication Need for lift equipment        Time: [phone] OT Time Calculation (min): 37 min  Charges: OT General Charges $OT Visit: 1 Visit OT Treatments $Self Care/Home Management : 8-22 mins  10/25/2016 Martie Round, OTR/L Pager: [phone]   Iran Planas [place] Bailiff 10/25/2016, 11:07 AM

## 2016-10-25 NOTE — Clinical Social Work Placement (Signed)
   CLINICAL SOCIAL WORK PLACEMENT  NOTE  Date:  10/25/2016  Patient Details  Name: Brandon [name] MRN: [id number] Date of Birth: May 11, 1955  Clinical Social Work is seeking post-discharge placement for this patient at the Skilled  Nursing Facility level of care (*CSW will initial, date and re-position this form in  chart as items are completed):  Yes   Patient/family provided with [place] Clinical Social Work Department's list of facilities offering this level of care within the geographic area requested by the patient (or if unable, by the patient's family).  Yes   Patient/family informed of their freedom to choose among providers that offer the needed level of care, that participate in Medicare, Medicaid or managed care program needed by the patient, have an available bed and are willing to accept the patient.  Yes   Patient/family informed of 's ownership interest in [facility] and [facility], as well as of the fact that they are under no obligation to receive care at these facilities.  PASRR submitted to [facility] on 10/25/16     PASRR number received on  10/25/16     Existing PASRR number confirmed on 10/25/16     FL2 transmitted to all facilities in geographic area requested by pt/family on       FL2 transmitted to all facilities within larger geographic area on 10/25/16     Patient informed that his/her managed care company has contracts with or will negotiate with certain facilities, including the following:        Yes   Patient/family informed of bed offers received.  Patient chooses bed at [facility]     Physician recommends and patient chooses bed at      Patient to be transferred to [facility] on 10/26/16.  Patient to be transferred to facility by [facility]     Patient family notified on 10/25/16 of transfer.  Name of family member notified:  Corinda Gubler      PHYSICIAN Please prepare priority discharge  summary, including medications     Additional Comment:    _______________________________________________ Robb Matar, LCSWA 10/25/2016, 1:57 PM

## 2016-10-25 NOTE — Progress Notes (Signed)
Rehab admissions - I have met with patient twice previously.  He has no help at home; has an elderly father.  He will not allow me to ask family if they will assist him.  At this point, SNF placement with [facility] is best choice for him.  Social worker is working on placement.  Call me for questions.  #[phone]

## 2016-10-26 MED ORDER — HYDRALAZINE HCL 25 MG PO TABS: 25.0000 mg | ORAL_TABLET | Freq: Three times a day (TID) | ORAL | Status: DC

## 2016-10-26 MED ORDER — AMLODIPINE BESYLATE 10 MG PO TABS: 10.0000 mg | ORAL_TABLET | Freq: Every day | ORAL | Status: DC

## 2016-10-26 MED ORDER — ALPRAZOLAM 0.5 MG PO TABS: 0.5000 mg | ORAL_TABLET | Freq: Two times a day (BID) | ORAL | 0 refills | Status: DC | PRN

## 2016-10-26 MED ORDER — ATORVASTATIN CALCIUM 40 MG PO TABS: 40.0000 mg | ORAL_TABLET | Freq: Every day | ORAL | Status: AC

## 2016-10-26 MED ORDER — METOPROLOL TARTRATE 50 MG PO TABS: 50.0000 mg | ORAL_TABLET | Freq: Two times a day (BID) | ORAL | Status: DC

## 2016-10-26 MED ORDER — SENNOSIDES-DOCUSATE SODIUM 8.6-50 MG PO TABS: 1.0000 | ORAL_TABLET | Freq: Two times a day (BID) | ORAL | Status: DC

## 2016-10-26 MED ORDER — ASPIRIN 81 MG PO CHEW: 81.0000 mg | CHEWABLE_TABLET | Freq: Every day | ORAL | Status: DC

## 2016-10-26 MED ORDER — CLOPIDOGREL BISULFATE 75 MG PO TABS: 75.0000 mg | ORAL_TABLET | Freq: Every day | ORAL | Status: AC

## 2016-10-26 NOTE — Discharge Summary (Signed)
Physician Discharge Summary   Patient ID: Brandon [name] MRN: [id number] DOB/AGE: [birth year] 61 y.o.  Admit date: 10/17/2016 Discharge date: 10/26/2016  Primary Care Physician:  Patient, No Pcp Per  Discharge Diagnoses:    . acute CVA (cerebral vascular accident) [facility]) with right-sided hemiplegia   Large PFO (patent foramen ovale)status post closure . Depression . ETOH abuse   Elevated troponin   Acute on chronic kidney disease stage III   Consults:   Cardiology Neurology CIR  Recommendations for Outpatient Follow-up:  1. PT evaluation recommended skilled nursing facility 2. Please repeat CBC/BMET at next visit   DIET: renal/carb modified     Allergies:  No Known Allergies   DISCHARGE MEDICATIONS: Current Discharge Medication List    START taking these medications   Details  amLODipine (NORVASC) 10 MG tablet Take 1 tablet (10 mg total) by mouth daily.    aspirin 81 MG chewable tablet Chew 1 tablet (81 mg total) by mouth daily.    atorvastatin (LIPITOR) 40 MG tablet Take 1 tablet (40 mg total) by mouth daily at 6 PM.    clopidogrel (PLAVIX) 75 MG tablet Take 1 tablet (75 mg total) by mouth daily with breakfast.    hydrALAZINE (APRESOLINE) 25 MG tablet Take 1 tablet (25 mg total) by mouth every 8 (eight) hours.    metoprolol tartrate (LOPRESSOR) 50 MG tablet Take 1 tablet (50 mg total) by mouth 2 (two) times daily.    senna-docusate (SENOKOT-S) 8.6-50 MG tablet Take 1 tablet by mouth 2 (two) times daily.      CONTINUE these medications which have CHANGED   Details  ALPRAZolam (XANAX) 0.5 MG tablet Take 1 tablet (0.5 mg total) by mouth 2 (two) times daily as needed for anxiety. Qty: 15 tablet, Refills: 0      CONTINUE these medications which have NOT CHANGED   Details  diphenhydrAMINE (BENADRYL) 25 mg capsule Take 25 mg by mouth every 6 (six) hours as needed for allergies.      STOP taking these medications     benazepril (LOTENSIN) 40 MG tablet           Brief H and P: For complete details please refer to admission H and P, but in brief HPI on 10/17/2016 by Dr. Richarda Overlie 61 year old male with a history of depression, alcohol abuse, chronic kidney disease, morbid obesity, found on the floor by his father, EMS was called. Patient noted to have right-sided weakness including right upper right lower extremity weakness, according to the patient onset 48 hours ago prior to presentation. Patient also had a headache but no nausea vomiting. Denied any blurry vision, slurred speech, difficulty swallowing. Patient was admitted for CVA and found to have large PFO, Underwent closure procedure on 9/25   Hospital Course:    Acute CVA (cerebral vascular accident) (HCC)with right-sided hemiplegia - presented with right-sided weakness, headache - MRI of the brain showed multifocal acute to early subacute ischemic infarcts involving bilateral cerebral and left cerebellar hemispheres. - MRA head and neck unremarkable, widely patent carotid artery systems bilaterally without high grade critical flow-limiting stenosis - LDL 111 placed on statin/Lipitor - Hemoglobin A1c 4.7 - 2-D echo showed EF of 60-65%, grade I diastolic dysfunction - Carotid Doppler showed 1-39% ICA stenosis, antegrade vertebral flow - PT recommended CIR, CIR recommended skilled nursing facility - TEE showed a large PFO, lower extremity Dopplers negative for DVT or SVT. The patient underwent closure of PFO on 9/25 - continue aspirin 81 mg daily, Plavix -  2-D echo post PFO closure showed normal Masser motion, normal systolic function,Atrial septum: An Amplatzer closure device in the fossa ovalisregion was present. Doppler showed no shunt, in the baseline state.  Active Problems: Large PFO (patent foramen ovale) - status post closure on 9/25 - followed closely by cardiology. 2-D echo post PFO closure with device in fossa ovalis region, no shunt   Elevated troponin -Likely  secondary to acute CVA, currently no chest pain - 2-D echo showed EF of 60-65% with grade 1 diastolic dysfunction - Cardiology recommended aspirin, Plavix, statin  -stress test 9/22 showed EF of 43% with no reversible ischemia or infarction, elevated dilatation and global hypokinesia, intermediate risk  Hyperlipidemia - Continue statin  Leukocytosis - Resolved, chest x-ray, UA unremarkable for infection, no fevers  Acute on Chronic kidney disease stage III - presented with creatinine of 1.8, Baseline creatinine 1.5-1.6 - currently at baseline 1.6   Day of Discharge BP 129/82 (BP Location: Right Leg)   Pulse 64   Temp 98.2 F (36.8 C) (Oral)   Resp 17   Ht  (1.803 m)   Wt 113.4 kg (250 lb)   SpO2 98%   BMI 34.87 kg/m   Physical Exam: General: Alert and awake oriented x3 not in any acute distress. HEENT: anicteric sclera, pupils reactive to light and accommodation CVS: S1-S2 clear no murmur rubs or gallops Chest: clear to auscultation bilaterally, no wheezing rales or rhonchi Abdomen: soft nontender, nondistended, normal bowel sounds Extremities: no cyanosis, clubbing or edema noted bilaterally Neuro: Right-sided hemiplegia   The results of significant diagnostics from this hospitalization (including imaging, microbiology, ancillary and laboratory) are listed below for reference.    LAB RESULTS: Basic Metabolic Panel:  Recent Labs Lab 10/24/16 0301 10/25/16 0215  NA 136 134*  K 4.2 4.1  CL 105 103  CO2 21* 26  GLUCOSE 98 96  BUN 26* 24*  CREATININE 1.61* 1.65*  CALCIUM 8.8* 8.7*   Liver Function Tests: No results for input(s): AST, ALT, ALKPHOS, BILITOT, PROT, ALBUMIN in the last 168 hours. No results for input(s): LIPASE, AMYLASE in the last 168 hours. No results for input(s): AMMONIA in the last 168 hours. CBC:  Recent Labs Lab 10/23/16 0533 10/24/16 0301  WBC 9.1 9.3  HGB 16.1 17.0  HCT 46.2 48.6  MCV 97.1 97.4  PLT 195 157   Cardiac  Enzymes:  Recent Labs Lab 10/20/16 0604  CKTOTAL 85   BNP: Invalid input(s): POCBNP CBG: No results for input(s): GLUCAP in the last 168 hours.  Significant Diagnostic Studies:  Dg Chest 2 View  Result Date: 10/17/2016 CLINICAL DATA:  Uncontrolled hypertension and right-sided weakness EXAM: CHEST  2 VIEW COMPARISON:  04/28/2008 FINDINGS: Cardiac shadow is at the upper limits of normal in size. The lungs are well aerated bilaterally. Previously seen left basilar changes have resolved in the interval. No acute bony abnormality is seen. IMPRESSION: No active cardiopulmonary disease. Electronically Signed   By: Alcide Clever M.D.   On: 10/17/2016 15:17   Dg Ankle 2 Views Right  Result Date: 10/18/2016 CLINICAL DATA:  Right ankle pain.  No injury. EXAM: RIGHT ANKLE - 2 VIEW COMPARISON:  None. FINDINGS: There is no evidence of fracture, dislocation, or joint effusion. The talar dome is intact. The ankle mortise is symmetric. Achilles and plantar enthesopathy. Dorsal spurring of the talar neck. Bone mineralization is normal. Soft tissues are unremarkable. IMPRESSION: No acute osseous abnormality. Electronically Signed   By: Vickki Hearing.D.  On: 10/18/2016 13:36   Ct Head Wo Contrast  Result Date: 10/17/2016 CLINICAL DATA:  Right-sided weakness and facial droop with leaning. Symptoms since waking. EXAM: CT HEAD WITHOUT CONTRAST TECHNIQUE: Contiguous axial images were obtained from the base of the skull through the vertex without intravenous contrast. COMPARISON:  None. FINDINGS: Brain: No evidence of acute cortical infarction. No hemorrhage, hydrocephalus, or masslike findings. There is advanced small-vessel type ischemic injury in the cerebral white matter and likely right pons. In general these changes are chronic, but specific areas of insults are age-indeterminate by CT. Small remote appearing high right frontal cortex infarct. Vascular: Atherosclerotic calcification. Vessels appear large in  tortuous, which can be a sign of chronic hypertension. Skull: No acute or aggressive finding. Sinuses/Orbits: Negative. IMPRESSION: 1. No acute hemorrhage or definite acute infarct. 2. Advanced chronic small vessel ischemia which could easily obscure an acute white matter or deep gray nucleus infarct. 3. Small remote appearing right frontal cortex infarct. Electronically Signed   By: Marnee Spring M.D.   On: 10/17/2016 09:42   Mr Maxine Glenn Head Wo Contrast  Result Date: 10/17/2016 CLINICAL DATA:  Initial evaluation for acute right-sided weakness, right-sided facial droop. EXAM: MRI HEAD WITHOUT AND WITH CONTRAST MRA HEAD WITHOUT CONTRAST MRA NECK WITHOUT AND WITH CONTRAST TECHNIQUE: Multiplanar, multiecho pulse sequences of the brain and surrounding structures were obtained without intravenous contrast. Angiographic images of the Circle of Willis were obtained using MRA technique without intravenous contrast. Angiographic images of the neck were obtained using MRA technique without and with intravenous contrast. Carotid stenosis measurements (when applicable) are obtained utilizing NASCET criteria, using the distal internal carotid diameter as the denominator. CONTRAST:  20mL MULTIHANCE GADOBENATE DIMEGLUMINE 529 MG/ML IV SOLN COMPARISON:  Prior CT from earlier same day. FINDINGS: MRI HEAD FINDINGS Study degraded by motion artifact. Diffuse prominence of the CSF containing spaces compatible with generalized cerebral atrophy. Extensive patchy and confluent T2/FLAIR hyperintensity within the periventricular and deep white matter both cerebral hemispheres, consistent with chronic microvascular disease. Superimposed remote lacunar infarcts present within the bilateral basal ganglia as well as the periventricular and deep white matter both cerebral hemispheres. Remote lacunar infarct present within the pons and middle cerebellar peduncle is as well. Approximate 2 cm acute ischemic infarct seen involving the posterior  limb of the left and sternal capsule extending into the posterior left corona radiata (series 3, image 27). Patchy extension into the left subinsular white matter (series 3, image 24). Additional 5 mm ischemic infarct at the posterior left mesial temporal lobe (series 3, image 21). Few additional punctate cortical and subcortical infarcts seen involving the right cerebral hemisphere (series 3, image 33, 16) as well as the superior left cerebral hemisphere (series 3, image 35). Probable additional acute/early subacute punctate left cerebellar infarct (series 7, image 14). No associated hemorrhage or mass effect. The no evidence for acute intracranial hemorrhage. Single subcentimeter chronic microhemorrhage noted at the right external capsule (series 10, image 16). No mass lesion, midline shift or mass effect. No hydrocephalus. No extra-axial fluid collection. Major dural sinuses patent. No abnormal enhancement. Pituitary gland normal. Right vertebral artery diminutive and not well visualized. Major intracranial vascular flow voids otherwise maintained. Craniocervical junction normal. Upper cervical spine within normal limits. Bone marrow signal intensity normal. No scalp soft tissue abnormality. Globes and orbital soft tissues within normal limits. Mild mucosal thickening within the ethmoidal air cells and maxillary sinuses. Superimposed small left maxillary sinus retention cyst noted. Mastoids are clear. Inner ear structures normal. MRA  HEAD FINDINGS ANTERIOR CIRCULATION: Study moderately degraded by motion artifact. Petrous, cavernous, and supraclinoid segments of the internal carotid arteries are patent without flow-limiting stenosis. Origin of the ophthalmic arteries patent bilaterally. ICA termini widely patent. A1 segments patent bilaterally. Anterior communicating artery not well seen on this motion degraded exam. There is an apparent focal severe stenosis of the mid-distal left A2 segment (series 6, image  116). Possible additional focal moderate stenosis involving the distal right A2 segment (series 6, image 133). M1 segments patent without stenosis or occlusion. MCA bifurcations normal. No obvious proximal M2 occlusion. Possible short-segment moderate to severe proximal left M2 stenosis noted (series 604, image 13). Distal MCA branches not well evaluated on this motion degraded exam, but are grossly symmetric and well perfused. POSTERIOR CIRCULATION: Left vertebral artery dominant and widely patent to the vertebrobasilar junction. Right vertebral artery diffusely hypoplastic and patent as well without obvious stenosis. Origin of the posterior inferior cerebral arteries not seen on this exam. Basilar artery mildly tortuous but widely patent to its distal aspect without stenosis. Superior cerebral arteries patent proximally. Both of the posterior cerebral arteries primarily supplied via the basilar. PCAs widely patent proximally, not well evaluated distally due to motion. No aneurysm or vascular malformation. MRA NECK FINDINGS Source images reviewed. Visualized aortic arch of normal caliber with normal 3 vessel morphology. No flow-limiting stenosis about the origin of the great vessels. Visualized subclavian arteries widely patent. Right common and internal carotid artery is widely patent within the neck without high-grade or critical stenosis. No significant atheromatous narrowing about the right carotid bifurcation. Left common and internal carotid artery's widely patent within the neck without high-grade or critical stenosis. Mild atheromatous irregularity about the left carotid bifurcation without flow-limiting stenosis. Both vertebral arteries arise from the subclavian arteries. Left vertebral artery dominant with a diffusely hypoplastic right vertebral artery. Short-segment moderate stenosis at the proximal right P1 segment (series 81191, image 10). Mild atheromatous irregularity within the distal right V2/V3  segments. Pre foraminal left V1 segment tortuous. No other high-grade or flow-limiting stenosis within the vertebral arteries bilaterally. IMPRESSION: MRI HEAD IMPRESSION: 1. Multifocal acute to early subacute ischemic infarcts involving the bilateral cerebral and left cerebellar hemispheres as above, most prominent of which is a 2 cm infarct at the posterior left lentiform nucleus/corona radiata. No associated hemorrhage or mass effect. Possible central thromboembolic etiology suspected given the various vascular distributions involved. 2. Generalized age-related cerebral atrophy with advanced chronic microvascular ischemic disease. MRA HEAD IMPRESSION: 1. Negative intracranial MRA. No large or proximal arterial branch occlusion. No high-grade or correctable stenosis. 2. Question short-segment moderate to severe bilateral A2 and proximal left M2 stenoses, somewhat limited in evaluation due to extensive motion artifact on this exam. MRA NECK IMPRESSION: 1. Widely patent carotid artery systems bilaterally without high-grade or critical flow limiting stenosis. 2. Short-segment moderate proximal right V1 stenosis. Vertebral arteries otherwise widely patent within the neck. Left vertebral artery is dominant with a diffusely hypoplastic right vertebral artery. Electronically Signed   By: Rise Mu M.D.   On: 10/17/2016 15:24   Mr Angiogram Neck W Or Wo Contrast  Result Date: 10/17/2016 CLINICAL DATA:  Initial evaluation for acute right-sided weakness, right-sided facial droop. EXAM: MRI HEAD WITHOUT AND WITH CONTRAST MRA HEAD WITHOUT CONTRAST MRA NECK WITHOUT AND WITH CONTRAST TECHNIQUE: Multiplanar, multiecho pulse sequences of the brain and surrounding structures were obtained without intravenous contrast. Angiographic images of the Circle of Willis were obtained using MRA technique without intravenous contrast. Angiographic images  of the neck were obtained using MRA technique without and with  intravenous contrast. Carotid stenosis measurements (when applicable) are obtained utilizing NASCET criteria, using the distal internal carotid diameter as the denominator. CONTRAST:  20mL MULTIHANCE GADOBENATE DIMEGLUMINE 529 MG/ML IV SOLN COMPARISON:  Prior CT from earlier same day. FINDINGS: MRI HEAD FINDINGS Study degraded by motion artifact. Diffuse prominence of the CSF containing spaces compatible with generalized cerebral atrophy. Extensive patchy and confluent T2/FLAIR hyperintensity within the periventricular and deep white matter both cerebral hemispheres, consistent with chronic microvascular disease. Superimposed remote lacunar infarcts present within the bilateral basal ganglia as well as the periventricular and deep white matter both cerebral hemispheres. Remote lacunar infarct present within the pons and middle cerebellar peduncle is as well. Approximate 2 cm acute ischemic infarct seen involving the posterior limb of the left and sternal capsule extending into the posterior left corona radiata (series 3, image 27). Patchy extension into the left subinsular white matter (series 3, image 24). Additional 5 mm ischemic infarct at the posterior left mesial temporal lobe (series 3, image 21). Few additional punctate cortical and subcortical infarcts seen involving the right cerebral hemisphere (series 3, image 33, 16) as well as the superior left cerebral hemisphere (series 3, image 35). Probable additional acute/early subacute punctate left cerebellar infarct (series 7, image 14). No associated hemorrhage or mass effect. The no evidence for acute intracranial hemorrhage. Single subcentimeter chronic microhemorrhage noted at the right external capsule (series 10, image 16). No mass lesion, midline shift or mass effect. No hydrocephalus. No extra-axial fluid collection. Major dural sinuses patent. No abnormal enhancement. Pituitary gland normal. Right vertebral artery diminutive and not well visualized.  Major intracranial vascular flow voids otherwise maintained. Craniocervical junction normal. Upper cervical spine within normal limits. Bone marrow signal intensity normal. No scalp soft tissue abnormality. Globes and orbital soft tissues within normal limits. Mild mucosal thickening within the ethmoidal air cells and maxillary sinuses. Superimposed small left maxillary sinus retention cyst noted. Mastoids are clear. Inner ear structures normal. MRA HEAD FINDINGS ANTERIOR CIRCULATION: Study moderately degraded by motion artifact. Petrous, cavernous, and supraclinoid segments of the internal carotid arteries are patent without flow-limiting stenosis. Origin of the ophthalmic arteries patent bilaterally. ICA termini widely patent. A1 segments patent bilaterally. Anterior communicating artery not well seen on this motion degraded exam. There is an apparent focal severe stenosis of the mid-distal left A2 segment (series 6, image 116). Possible additional focal moderate stenosis involving the distal right A2 segment (series 6, image 133). M1 segments patent without stenosis or occlusion. MCA bifurcations normal. No obvious proximal M2 occlusion. Possible short-segment moderate to severe proximal left M2 stenosis noted (series 604, image 13). Distal MCA branches not well evaluated on this motion degraded exam, but are grossly symmetric and well perfused. POSTERIOR CIRCULATION: Left vertebral artery dominant and widely patent to the vertebrobasilar junction. Right vertebral artery diffusely hypoplastic and patent as well without obvious stenosis. Origin of the posterior inferior cerebral arteries not seen on this exam. Basilar artery mildly tortuous but widely patent to its distal aspect without stenosis. Superior cerebral arteries patent proximally. Both of the posterior cerebral arteries primarily supplied via the basilar. PCAs widely patent proximally, not well evaluated distally due to motion. No aneurysm or vascular  malformation. MRA NECK FINDINGS Source images reviewed. Visualized aortic arch of normal caliber with normal 3 vessel morphology. No flow-limiting stenosis about the origin of the great vessels. Visualized subclavian arteries widely patent. Right common and internal carotid artery is  widely patent within the neck without high-grade or critical stenosis. No significant atheromatous narrowing about the right carotid bifurcation. Left common and internal carotid artery's widely patent within the neck without high-grade or critical stenosis. Mild atheromatous irregularity about the left carotid bifurcation without flow-limiting stenosis. Both vertebral arteries arise from the subclavian arteries. Left vertebral artery dominant with a diffusely hypoplastic right vertebral artery. Short-segment moderate stenosis at the proximal right P1 segment (series 16109, image 10). Mild atheromatous irregularity within the distal right V2/V3 segments. Pre foraminal left V1 segment tortuous. No other high-grade or flow-limiting stenosis within the vertebral arteries bilaterally. IMPRESSION: MRI HEAD IMPRESSION: 1. Multifocal acute to early subacute ischemic infarcts involving the bilateral cerebral and left cerebellar hemispheres as above, most prominent of which is a 2 cm infarct at the posterior left lentiform nucleus/corona radiata. No associated hemorrhage or mass effect. Possible central thromboembolic etiology suspected given the various vascular distributions involved. 2. Generalized age-related cerebral atrophy with advanced chronic microvascular ischemic disease. MRA HEAD IMPRESSION: 1. Negative intracranial MRA. No large or proximal arterial branch occlusion. No high-grade or correctable stenosis. 2. Question short-segment moderate to severe bilateral A2 and proximal left M2 stenoses, somewhat limited in evaluation due to extensive motion artifact on this exam. MRA NECK IMPRESSION: 1. Widely patent carotid artery systems  bilaterally without high-grade or critical flow limiting stenosis. 2. Short-segment moderate proximal right V1 stenosis. Vertebral arteries otherwise widely patent within the neck. Left vertebral artery is dominant with a diffusely hypoplastic right vertebral artery. Electronically Signed   By: Rise Mu M.D.   On: 10/17/2016 15:24   Mr Laqueta Jean UE Contrast  Result Date: 10/17/2016 CLINICAL DATA:  Initial evaluation for acute right-sided weakness, right-sided facial droop. EXAM: MRI HEAD WITHOUT AND WITH CONTRAST MRA HEAD WITHOUT CONTRAST MRA NECK WITHOUT AND WITH CONTRAST TECHNIQUE: Multiplanar, multiecho pulse sequences of the brain and surrounding structures were obtained without intravenous contrast. Angiographic images of the Circle of Willis were obtained using MRA technique without intravenous contrast. Angiographic images of the neck were obtained using MRA technique without and with intravenous contrast. Carotid stenosis measurements (when applicable) are obtained utilizing NASCET criteria, using the distal internal carotid diameter as the denominator. CONTRAST:  20mL MULTIHANCE GADOBENATE DIMEGLUMINE 529 MG/ML IV SOLN COMPARISON:  Prior CT from earlier same day. FINDINGS: MRI HEAD FINDINGS Study degraded by motion artifact. Diffuse prominence of the CSF containing spaces compatible with generalized cerebral atrophy. Extensive patchy and confluent T2/FLAIR hyperintensity within the periventricular and deep white matter both cerebral hemispheres, consistent with chronic microvascular disease. Superimposed remote lacunar infarcts present within the bilateral basal ganglia as well as the periventricular and deep white matter both cerebral hemispheres. Remote lacunar infarct present within the pons and middle cerebellar peduncle is as well. Approximate 2 cm acute ischemic infarct seen involving the posterior limb of the left and sternal capsule extending into the posterior left corona radiata  (series 3, image 27). Patchy extension into the left subinsular white matter (series 3, image 24). Additional 5 mm ischemic infarct at the posterior left mesial temporal lobe (series 3, image 21). Few additional punctate cortical and subcortical infarcts seen involving the right cerebral hemisphere (series 3, image 33, 16) as well as the superior left cerebral hemisphere (series 3, image 35). Probable additional acute/early subacute punctate left cerebellar infarct (series 7, image 14). No associated hemorrhage or mass effect. The no evidence for acute intracranial hemorrhage. Single subcentimeter chronic microhemorrhage noted at the right external capsule (series 10, image 16).  No mass lesion, midline shift or mass effect. No hydrocephalus. No extra-axial fluid collection. Major dural sinuses patent. No abnormal enhancement. Pituitary gland normal. Right vertebral artery diminutive and not well visualized. Major intracranial vascular flow voids otherwise maintained. Craniocervical junction normal. Upper cervical spine within normal limits. Bone marrow signal intensity normal. No scalp soft tissue abnormality. Globes and orbital soft tissues within normal limits. Mild mucosal thickening within the ethmoidal air cells and maxillary sinuses. Superimposed small left maxillary sinus retention cyst noted. Mastoids are clear. Inner ear structures normal. MRA HEAD FINDINGS ANTERIOR CIRCULATION: Study moderately degraded by motion artifact. Petrous, cavernous, and supraclinoid segments of the internal carotid arteries are patent without flow-limiting stenosis. Origin of the ophthalmic arteries patent bilaterally. ICA termini widely patent. A1 segments patent bilaterally. Anterior communicating artery not well seen on this motion degraded exam. There is an apparent focal severe stenosis of the mid-distal left A2 segment (series 6, image 116). Possible additional focal moderate stenosis involving the distal right A2 segment  (series 6, image 133). M1 segments patent without stenosis or occlusion. MCA bifurcations normal. No obvious proximal M2 occlusion. Possible short-segment moderate to severe proximal left M2 stenosis noted (series 604, image 13). Distal MCA branches not well evaluated on this motion degraded exam, but are grossly symmetric and well perfused. POSTERIOR CIRCULATION: Left vertebral artery dominant and widely patent to the vertebrobasilar junction. Right vertebral artery diffusely hypoplastic and patent as well without obvious stenosis. Origin of the posterior inferior cerebral arteries not seen on this exam. Basilar artery mildly tortuous but widely patent to its distal aspect without stenosis. Superior cerebral arteries patent proximally. Both of the posterior cerebral arteries primarily supplied via the basilar. PCAs widely patent proximally, not well evaluated distally due to motion. No aneurysm or vascular malformation. MRA NECK FINDINGS Source images reviewed. Visualized aortic arch of normal caliber with normal 3 vessel morphology. No flow-limiting stenosis about the origin of the great vessels. Visualized subclavian arteries widely patent. Right common and internal carotid artery is widely patent within the neck without high-grade or critical stenosis. No significant atheromatous narrowing about the right carotid bifurcation. Left common and internal carotid artery's widely patent within the neck without high-grade or critical stenosis. Mild atheromatous irregularity about the left carotid bifurcation without flow-limiting stenosis. Both vertebral arteries arise from the subclavian arteries. Left vertebral artery dominant with a diffusely hypoplastic right vertebral artery. Short-segment moderate stenosis at the proximal right P1 segment (series 16109, image 10). Mild atheromatous irregularity within the distal right V2/V3 segments. Pre foraminal left V1 segment tortuous. No other high-grade or flow-limiting  stenosis within the vertebral arteries bilaterally. IMPRESSION: MRI HEAD IMPRESSION: 1. Multifocal acute to early subacute ischemic infarcts involving the bilateral cerebral and left cerebellar hemispheres as above, most prominent of which is a 2 cm infarct at the posterior left lentiform nucleus/corona radiata. No associated hemorrhage or mass effect. Possible central thromboembolic etiology suspected given the various vascular distributions involved. 2. Generalized age-related cerebral atrophy with advanced chronic microvascular ischemic disease. MRA HEAD IMPRESSION: 1. Negative intracranial MRA. No large or proximal arterial branch occlusion. No high-grade or correctable stenosis. 2. Question short-segment moderate to severe bilateral A2 and proximal left M2 stenoses, somewhat limited in evaluation due to extensive motion artifact on this exam. MRA NECK IMPRESSION: 1. Widely patent carotid artery systems bilaterally without high-grade or critical flow limiting stenosis. 2. Short-segment moderate proximal right V1 stenosis. Vertebral arteries otherwise widely patent within the neck. Left vertebral artery is dominant with a diffusely  hypoplastic right vertebral artery. Electronically Signed   By: Rise Mu M.D.   On: 10/17/2016 15:24    2D ECHO: 9/26 Study Conclusions  - Left ventricle: The cavity size was normal. Systolic function was   normal. [place] motion was normal; there were no regional Miyazaki   motion abnormalities. - Atrial septum: An Amplatzer closure device in the fossa ovalis   region was present. Doppler showed no shunt, in the baseline   state.  Disposition and Follow-up: Discharge Instructions    Ambulatory referral to Neurology    Complete by:  As directed    Pt will follow up with Dr. Pearlean Brownie at [facility] in about 2 months. Thanks.   Diet - low sodium heart healthy    Complete by:  As directed    Increase activity slowly    Complete by:  As directed        DISPOSITION:   SNF   DISCHARGE FOLLOW-UP Follow-up Information    Micki Riley, MD. Schedule an appointment as soon as possible for a visit in 6 week(s).   Specialties:  Neurology, Radiology Contact information: [address] [place] [address] [phone]        Yates Decamp, MD Follow up on 11/07/2016.   Specialty:  Cardiology Why:  9:30 AM Contact information: [address] [place] [address] [phone]            Time spent on Discharge:   Signed:   Thad Ranger M.D. [facility] 10/26/2016, 11:47 AM Pager: [phone]

## 2016-10-26 NOTE — Progress Notes (Addendum)
CSW placed all needed forms in pt's drawer outside of room. CSW spoke with Melissa Montane [phone]) to confirm that pt had been seen and screeneed for Medicaid application. CSW provided pt with Sharon's number per Sharon's request for further information and details. CSW to call [facility] at 2pm. RN to call report to [phone]). No further CSW needs. CSW signing off.     Claude Manges Richey Doolittle, MSW, LCSW-A Emergency Department Clinical Social Worker [phone]

## 2016-10-29 ENCOUNTER — Encounter: Payer: Self-pay | Admitting: Adult Health

## 2016-10-29 ENCOUNTER — Non-Acute Institutional Stay (SKILLED_NURSING_FACILITY): Payer: Medicaid Other | Admitting: Adult Health

## 2016-10-29 DIAGNOSIS — E7800 Pure hypercholesterolemia, unspecified: Secondary | ICD-10-CM | POA: Diagnosis not present

## 2016-10-29 DIAGNOSIS — G8101 Flaccid hemiplegia affecting right dominant side: Secondary | ICD-10-CM | POA: Diagnosis not present

## 2016-10-29 DIAGNOSIS — N183 Chronic kidney disease, stage 3 unspecified: Secondary | ICD-10-CM

## 2016-10-29 DIAGNOSIS — I10 Essential (primary) hypertension: Secondary | ICD-10-CM | POA: Diagnosis not present

## 2016-10-29 DIAGNOSIS — Q2112 Patent foramen ovale: Secondary | ICD-10-CM

## 2016-10-29 DIAGNOSIS — I6340 Cerebral infarction due to embolism of unspecified cerebral artery: Secondary | ICD-10-CM

## 2016-10-29 DIAGNOSIS — F329 Major depressive disorder, single episode, unspecified: Secondary | ICD-10-CM | POA: Diagnosis not present

## 2016-10-29 DIAGNOSIS — Q211 Atrial septal defect: Secondary | ICD-10-CM | POA: Diagnosis not present

## 2016-10-29 DIAGNOSIS — F32A Depression, unspecified: Secondary | ICD-10-CM

## 2016-10-29 LAB — FACTOR 5 LEIDEN

## 2016-10-29 LAB — PROTHROMBIN GENE MUTATION

## 2016-10-29 NOTE — Progress Notes (Signed)
Location:   [facility] Room Number: 119A Place of Service:  SNF (31)   CODE STATUS: Full Code  No Known Allergies  Chief Complaint  Patient presents with  . Hospitalization Follow-up    Hospital follow up    HPI:  He is a 61 year old man who has been hospitalized from 10-17-16 through 10-26-16 for an acute cva with right side hemiplegia; large PFO status post closure(10-23-16); alcohol abuse and acute on chronic renal failure stage III. He is here for short term rehab with his goal to return back home. He is somewhat quiet. He does have family present. He is not voicing any complaints of chest pain, headache or back pain. There are no reports of insomnia present.  There are no nursing concerns at this time   Past Medical History:  Diagnosis Date  . Depression   . Hx of pleural effusion   . Hyperlipidemia   . Hypertension     Past Surgical History:  Procedure Laterality Date  . PATENT FORAMEN OVALE(PFO) CLOSURE N/A 10/23/2016   Procedure: Patent [place](PFO) Closure;  Surgeon: Yates Decamp, MD;  Location: [facility] INVASIVE CV LAB;  Service: Cardiovascular;  Laterality: N/A;  . TEE WITHOUT CARDIOVERSION N/A 10/19/2016   Procedure: TRANSESOPHAGEAL ECHOCARDIOGRAM (TEE);  Surgeon: Yates Decamp, MD;  Location: [facility] ENDOSCOPY;  Service: Cardiovascular;  Laterality: N/A;  . THORACENTESIS    . ULTRASOUND GUIDANCE FOR VASCULAR ACCESS  10/23/2016   Procedure: Ultrasound Guidance For Vascular Access;  Surgeon: Yates Decamp, MD;  Location: [facility] INVASIVE CV LAB;  Service: Cardiovascular;;    Social History   Social History  . Marital status: Legally Separated    Spouse name: N/A  . Number of children: N/A  . Years of education: N/A   Occupational History  . Not on file.   Social History Main Topics  . Smoking status: Never Smoker  . Smokeless tobacco: Never Used  . Alcohol use Yes  . Drug use: No  . Sexual activity: No   Other Topics Concern  . Not on file   Social History  Narrative  . No narrative on file   History reviewed. No pertinent family history.    VITAL SIGNS BP 123/76   Pulse 68   Temp 97.6 F (36.4 C)   Resp 18   Ht  (1.803 m)   Wt 245 lb 1.6 oz (111.2 kg)   SpO2 95%   BMI 34.18 kg/m    Patient's Medications  New Prescriptions   No medications on file  Previous Medications   ALPRAZOLAM (XANAX) 0.5 MG TABLET    Take 1 tablet (0.5 mg total) by mouth 2 (two) times daily as needed for anxiety.   AMLODIPINE (NORVASC) 10 MG TABLET    Take 1 tablet (10 mg total) by mouth daily.   ASPIRIN 81 MG CHEWABLE TABLET    Chew 1 tablet (81 mg total) by mouth daily.   ATORVASTATIN (LIPITOR) 40 MG TABLET    Take 1 tablet (40 mg total) by mouth daily at 6 PM.   CLOPIDOGREL (PLAVIX) 75 MG TABLET    Take 1 tablet (75 mg total) by mouth daily with breakfast.   DIPHENHYDRAMINE (BENADRYL) 25 MG CAPSULE    Take 25 mg by mouth every 6 (six) hours as needed for allergies.   HYDRALAZINE (APRESOLINE) 25 MG TABLET    Take 1 tablet (25 mg total) by mouth every 8 (eight) hours.   METOPROLOL TARTRATE (LOPRESSOR) 50 MG TABLET  Take 1 tablet (50 mg total) by mouth 2 (two) times daily.   SENNA-DOCUSATE (SENOKOT-S) 8.6-50 MG TABLET    Take 1 tablet by mouth 2 (two) times daily.  Modified Medications   No medications on file  Discontinued Medications   No medications on file     SIGNIFICANT DIAGNOSTIC EXAMS  TODAY:   10-17-16: ct head: 1. No acute hemorrhage or definite acute infarct. 2. Advanced chronic small vessel ischemia which could easily obscure an acute white matter or deep gray nucleus infarct. 3. Small remote appearing right frontal cortex infarct.  10-17-16: chest x-ray: No active cardiopulmonary disease.   10-17-16: MRI head; MRA head and neck:   MRI HEAD IMPRESSION:  1. Multifocal acute to early subacute ischemic infarcts involving the bilateral cerebral and left cerebellar hemispheres as above, most prominent of which is a 2 cm infarct at  the posterior left lentiform nucleus/corona radiata. No associated hemorrhage or mass effect. Possible central thromboembolic etiology suspected given the various vascular distributions involved. 2. Generalized age-related cerebral atrophy with advanced chronic microvascular ischemic disease.   MRA HEAD IMPRESSION: 1. Negative intracranial MRA. No large or proximal arterial branch occlusion. No high-grade or correctable stenosis. 2. Question short-segment moderate to severe bilateral A2 and proximal left M2 stenoses, somewhat limited in evaluation due to extensive motion artifact on this exam.   MRA NECK IMPRESSION: 1. Widely patent carotid artery systems bilaterally without high-grade or critical flow limiting stenosis. 2. Short-segment moderate proximal right V1 stenosis. Vertebral arteries otherwise widely patent within the neck. 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  10-18-16: carotid doppler:  - The vertebral arteries appear patent with antegrade flow. - Findings consistent with a 1- 39 percent stenosis involving the right internal carotid artery and the left internal carotid artery.  10-18-16: right ankle x-ray: No acute osseous abnormality.  10-19-16: TEE:  - Left ventricle: Systolic function was normal. [place] motion was normal; there were no regional Goller motion abnormalities. - Mitral valve: There is mild redundant chordae with systolic anterior motion of the chordae without LVOT obstruction. Trivial prolapse, involving the anterior leaflet. No significant myxomatous degeneration. There was trivial regurgitation. - Left atrium: No evidence of thrombus in the atrial cavity or appendage. - Right atrium: No evidence of thrombus in the atrial cavity or appendage. - Atrial septum: There was a very large patent foramen ovale. Agitated saline contrast study showed a large right-to-left atrial level shunt, in the baseline state. Agitated saline  contrast study showed a large right-to-left atrial level shunt, following an increase in RA pressure induced by abdominal compression. - Impressions: A Very large PFO is probable source of the paradoxical embolic stroke.  10-20-16: bilateral lower extremity doppler: No evidence of deep vein or superficial thrombosis involving the right lower extremity and left lower extremity.  10-20-16: myoview:  1. No reversible ischemia or infarction. 2. LEFT ventricle dilatation and global hypokinesia. 3. Left ventricular ejection fraction 43% 4. Non invasive risk stratification*: Intermediate (classification due to low ejection fraction)  10-23-16: patent foramen ovale closure   TODAY:  10-17-16: wbc 12.3; hgb 17.8; hct 48.4; mcv 94.2; plt 257; glucose 101; bun 48; creat 1.80; k+ 3.4; na++ 142; ca 9.2; total bili 1.4; ast 59; albumin 3.7 10-18-16: wbc 9.3; hgb 15.9; hct 45.2; mcv 96.2; plt 219; glucose 97; bun 33; creat 1.56; k+ 3.5; na++ 143; ca 8.7; hgb a1c 4.7; chol 177; ldl 111; trig 232; hdl 20 1-61-09: wbc 9.3; hgb 17.0; hct  48.6; mcv 97.4; plt 157; glucose 98; bun 26; creat 1.61; k+ 4.2; na++ 136; ca 8.8    Review of Systems  Constitutional: Negative for malaise/fatigue.  Respiratory: Negative for cough.   Cardiovascular: Negative for chest pain and leg swelling.  Gastrointestinal: Negative for abdominal pain and constipation.  Musculoskeletal: Negative for back pain and joint pain.  Skin: Negative.   Neurological: Negative for dizziness.  Psychiatric/Behavioral: The patient is not nervous/anxious.     Physical Exam  Constitutional: No distress.  Overweight   Eyes: Conjunctivae are normal.  Neck: Neck supple. No thyromegaly present.  Cardiovascular: Normal rate, regular rhythm and intact distal pulses.   Murmur heard. Pulmonary/Chest: Effort normal and breath sounds normal. No respiratory distress.  Abdominal: Soft. Bowel sounds are normal. He exhibits no distension.  Musculoskeletal:    Has right side hemiparesis Has trace lower extremity edema   Lymphadenopathy:    He has no cervical adenopathy.  Neurological: He is alert.  Skin: Skin is warm and dry. He is not diaphoretic.  Bruising on abdomen from injections in the hospital   Psychiatric: He has a normal mood and affect.      ASSESSMENT/ PLAN:  1. CVA with right hemiplegia: is stable will continue with therapy as directed and will follow up with neurology. Will continue asa 81 mg daily and plavix 75 mg daily  2. Hypertension: stable b/p 123/76: will continue norvasc 10 mg daily; apresoline 50 mg every 8 hours; lopressor 50 mg twice daily   3. Dyslipidemia: stable ldl 111 will continue lipitor 40 mg daily  4. Constipation: stable will continue senna s twice daily   5. Depression: without change is taking xanax 0.5 mg twice daily as needed for 14 days.   6. PFO (patent foramen ovale) is stable is status post closure (10-23-16): EF 60-65%: will not make changes and will monitor his status.   7. Chronic renal disease stage III: without change: bun 26; creat 1.61   Will check cmp    MD is aware of resident's narcotic use and is in agreement with current plan of care. We will attempt to wean resident as apropriate   Synthia Innocent NP [facility]  Contact [phone] Monday through Friday 8am- 5pm  After hours call [phone]

## 2016-11-01 ENCOUNTER — Non-Acute Institutional Stay (SKILLED_NURSING_FACILITY): Payer: Medicaid Other | Admitting: Internal Medicine

## 2016-11-01 ENCOUNTER — Encounter: Payer: Self-pay | Admitting: Internal Medicine

## 2016-11-01 DIAGNOSIS — I69359 Hemiplegia and hemiparesis following cerebral infarction affecting unspecified side: Secondary | ICD-10-CM | POA: Diagnosis not present

## 2016-11-01 DIAGNOSIS — N183 Chronic kidney disease, stage 3 unspecified: Secondary | ICD-10-CM

## 2016-11-01 DIAGNOSIS — G8101 Flaccid hemiplegia affecting right dominant side: Secondary | ICD-10-CM

## 2016-11-01 DIAGNOSIS — F418 Other specified anxiety disorders: Secondary | ICD-10-CM

## 2016-11-01 DIAGNOSIS — F1010 Alcohol abuse, uncomplicated: Secondary | ICD-10-CM

## 2016-11-01 DIAGNOSIS — E785 Hyperlipidemia, unspecified: Secondary | ICD-10-CM | POA: Diagnosis not present

## 2016-11-01 DIAGNOSIS — Z9889 Other specified postprocedural states: Secondary | ICD-10-CM

## 2016-11-01 DIAGNOSIS — I10 Essential (primary) hypertension: Secondary | ICD-10-CM

## 2016-11-01 NOTE — Progress Notes (Signed)
Patient ID: Brandon [name], male   DOB: July 02, 1955, 61 y.o.   MRN: [id number]     HISTORY AND PHYSICAL   DATE:  November 01, 2016  Location:   [facility] Room Number: 119 A Place of Service: SNF (31)   Extended Emergency Contact Information Primary Emergency Contact: [name],[name] Address: [address]          [place] [address] [place] Home Phone: [phone] Relation: Father  Advanced Directive information Does Patient Have a Medical Advance Directive?: No, Would patient like information on creating a medical advance directive?: No - Patient declined  Chief Complaint  Patient presents with  . New Admit To SNF    Admission    HPI:  61 yo male seen today as a new admission into SNF following hospital stay for acute CVA, Etoh abuse, A/CKD stage 3, elevated troponin, right flaccid hemiplegia, large PFO, hyperlipidemia, depression. He presented to the ED from home after found on the floor by his father. Pt noted to have right sided weakness in UE and LE 48 hrs prior to ED presentation along with N/V and HA. On TEE, a large PFO noted and he was taken to the OR for closure on 10/23/16. By 2D echo EF 60-65% with grade 1 DD. Carotid study revealed 1-39% stenosis. LE doppler neg DVT. His post PFO closure 2D echo -->Amplatzer closure device in place at fossa ovale with no shunt. Stress test on 10/20/16 neg for acute ischemia. He was started on ASA, plavix. A1c 4.7%; WBC 12.3k-->9.3K; Cr 1.8-->1.65; K dropped to 3.3-->4.1; AST 59; T bili 1.4; TG 232; HDL 20; LDL 111; hypercoagulable w/u neg; homocysteine level 27.6 at d/c. He presents to SNF for short term rehab.  Today he reports right sided weakness. No issues with swallowing. No change in bowel/bladder habits. Appetite ok and sleeps well. No aphasia. No nursing issues.  Hypertension - BP stable on norvasc 10 mg daily; apresoline 50 mg every 8 hours; lopressor 50 mg twice daily   Dyslipidemia - suboptimally  controlled. Recently started on lipitor 40 mg daily. LDL 111  Chronic Constipation - stable on senna s twice daily   Depression - mood stable. He was started on xanax 0.5 mg twice daily as needed for 14 days recently   CKD - stage 3. Cr 1.65  Etoh abuse - discussed complete Etoh cessation  Past Medical History:  Diagnosis Date  . Depression   . Hx of pleural effusion   . Hyperlipidemia   . Hypertension     Past Surgical History:  Procedure Laterality Date  . PATENT FORAMEN OVALE(PFO) CLOSURE N/A 10/23/2016   Procedure: Patent Forament [place](PFO) Closure;  Surgeon: Yates Decamp, MD;  Location: [facility] INVASIVE CV LAB;  Service: Cardiovascular;  Laterality: N/A;  . TEE WITHOUT CARDIOVERSION N/A 10/19/2016   Procedure: TRANSESOPHAGEAL ECHOCARDIOGRAM (TEE);  Surgeon: Yates Decamp, MD;  Location: [facility] ENDOSCOPY;  Service: Cardiovascular;  Laterality: N/A;  . THORACENTESIS    . ULTRASOUND GUIDANCE FOR VASCULAR ACCESS  10/23/2016   Procedure: Ultrasound Guidance For Vascular Access;  Surgeon: Yates Decamp, MD;  Location: [facility] INVASIVE CV LAB;  Service: Cardiovascular;;    Patient Care Team: Patient, No Pcp Per as PCP - General (General Practice)  Social History   Social History  . Marital status: Legally Separated    Spouse name: N/A  . Number of children: N/A  . Years of education: N/A   Occupational History  . Not on file.  Social History Main Topics  . Smoking status: Never Smoker  . Smokeless tobacco: Never Used  . Alcohol use Yes  . Drug use: No  . Sexual activity: No   Other Topics Concern  . Not on file   Social History Narrative  . No narrative on file     reports that he has never smoked. He has never used smokeless tobacco. He reports that he drinks alcohol. He reports that he does not use drugs.  History reviewed. No pertinent family history. No family status information on file.    Immunization History  Administered Date(s) Administered  . Tdap 01/11/2010    No  Known Allergies  Medications: Patient's Medications  New Prescriptions   No medications on file  Previous Medications   ACETAMINOPHEN (TYLENOL) 325 MG TABLET    Take 650 mg by mouth every 4 (four) hours as needed for mild pain or moderate pain.   ALPRAZOLAM (XANAX) 0.5 MG TABLET    Take 1 tablet (0.5 mg total) by mouth 2 (two) times daily as needed for anxiety.   AMLODIPINE (NORVASC) 10 MG TABLET    Take 1 tablet (10 mg total) by mouth daily.   ASPIRIN 81 MG CHEWABLE TABLET    Chew 1 tablet (81 mg total) by mouth daily.   ATORVASTATIN (LIPITOR) 40 MG TABLET    Take 1 tablet (40 mg total) by mouth daily at 6 PM.   CLOPIDOGREL (PLAVIX) 75 MG TABLET    Take 1 tablet (75 mg total) by mouth daily with breakfast.   DIPHENHYDRAMINE (BENADRYL) 25 MG CAPSULE    Take 25 mg by mouth every 6 (six) hours as needed for allergies.   HYDRALAZINE (APRESOLINE) 25 MG TABLET    Take 1 tablet (25 mg total) by mouth every 8 (eight) hours.   METOPROLOL TARTRATE (LOPRESSOR) 50 MG TABLET    Take 1 tablet (50 mg total) by mouth 2 (two) times daily.   SENNA-DOCUSATE (SENOKOT-S) 8.6-50 MG TABLET    Take 1 tablet by mouth 2 (two) times daily.  Modified Medications   No medications on file  Discontinued Medications   No medications on file    Review of Systems  Musculoskeletal: Positive for gait problem.  Neurological: Positive for weakness.  All other systems reviewed and are negative.   Vitals:   11/01/16 1020  BP: 136/74  Pulse: 64  Resp: 18  Temp: 97.6 F (36.4 C)  SpO2: 95%  Weight: 245 lb 1.6 oz (111.2 kg)  Height: 5\' 11"  (1.803 m)   Body mass index is 34.18 kg/m.  Physical Exam  Constitutional: He is oriented to person, place, and time. He appears well-developed and well-nourished.  Looks well in NAD, lying in bed sleeping but easily awakened.  HENT:  Mouth/Throat: Oropharynx is clear and moist.  MMM; no oral thrush  Eyes: Pupils are equal, round, and reactive to light. No scleral icterus.    Neck: Neck supple. Carotid bruit is not present. No thyromegaly present.  Cardiovascular: Normal rate, regular rhythm and intact distal pulses.  Exam reveals no gallop and no friction rub.   Murmur (1/6 SEM) heard. No distal LE edema. No calf TTP  Pulmonary/Chest: Effort normal and breath sounds normal. He has no wheezes. He has no rales. He exhibits no tenderness.  Abdominal: Soft. Normal appearance and bowel sounds are normal. He exhibits no distension, no abdominal bruit, no pulsatile midline mass and no mass. There is no hepatomegaly. There is no tenderness. There is no  rigidity, no rebound and no guarding. No hernia.  Musculoskeletal: He exhibits edema.  Lymphadenopathy:    He has no cervical adenopathy.  Neurological: He is alert and oriented to person, place, and time. He has normal reflexes.  Right upper>lower extremity hemiplegia  Skin: Skin is warm and dry. No rash noted.  Multiple contusions on abdomen  Psychiatric: He has a normal mood and affect. His behavior is normal. Judgment and thought content normal.     Labs reviewed: Admission on 10/17/2016, Discharged on 10/26/2016  No results displayed because visit has over 200 results.  CBC Latest Ref Rng & Units 10/24/2016 10/23/2016 10/21/2016  WBC 4.0 - 10.5 K/uL 9.3 9.1 9.4  Hemoglobin 13.0 - 17.0 g/dL 16.1 09.6 04.5  Hematocrit 39.0 - 52.0 % 48.6 46.2 48.4  Platelets 150 - 400 K/uL 157 195 199   CMP Latest Ref Rng & Units 10/25/2016 10/24/2016 10/23/2016  Glucose 65 - 99 mg/dL 96 98 94  BUN 6 - 20 mg/dL 40(J) 81(X) 91(Y)  Creatinine 0.61 - 1.24 mg/dL 7.82(N) 5.62(Z) 3.08(M)  Sodium 135 - 145 mmol/L 134(L) 136 137  Potassium 3.5 - 5.1 mmol/L 4.1 4.2 4.0  Chloride 101 - 111 mmol/L 103 105 106  CO2 22 - 32 mmol/L 26 21(L) 22  Calcium 8.9 - 10.3 mg/dL 5.7(Q) 4.6(N) 6.2(X)  Total Protein 6.5 - 8.1 g/dL - - -  Total Bilirubin 0.3 - 1.2 mg/dL - - -  Alkaline Phos 38 - 126 U/L - - -  AST 15 - 41 U/L - - -  ALT 17 - 63 U/L - -  -   Lipid Panel     Component Value Date/Time   CHOL 177 10/18/2016 1452   TRIG 232 (H) 10/18/2016 1452   HDL 20 (L) 10/18/2016 1452   CHOLHDL 8.9 10/18/2016 1452   VLDL 46 (H) 10/18/2016 1452   LDLCALC 111 (H) 10/18/2016 1452   Lab Results  Component Value Date   HGBA1C 4.7 (L) 10/18/2016       Dg Chest 2 View  Result Date: 10/17/2016 CLINICAL DATA:  Uncontrolled hypertension and right-sided weakness EXAM: CHEST  2 VIEW COMPARISON:  04/28/2008 FINDINGS: Cardiac shadow is at the upper limits of normal in size. The lungs are well aerated bilaterally. Previously seen left basilar changes have resolved in the interval. No acute bony abnormality is seen. IMPRESSION: No active cardiopulmonary disease. Electronically Signed   By: Alcide Clever M.D.   On: 10/17/2016 15:17   Dg Ankle 2 Views Right  Result Date: 10/18/2016 CLINICAL DATA:  Right ankle pain.  No injury. EXAM: RIGHT ANKLE - 2 VIEW COMPARISON:  None. FINDINGS: There is no evidence of fracture, dislocation, or joint effusion. The talar dome is intact. The ankle mortise is symmetric. Achilles and plantar enthesopathy. Dorsal spurring of the talar neck. Bone mineralization is normal. Soft tissues are unremarkable. IMPRESSION: No acute osseous abnormality. Electronically Signed   By: Obie Dredge M.D.   On: 10/18/2016 13:36   Ct Head Wo Contrast  Result Date: 10/17/2016 CLINICAL DATA:  Right-sided weakness and facial droop with leaning. Symptoms since waking. EXAM: CT HEAD WITHOUT CONTRAST TECHNIQUE: Contiguous axial images were obtained from the base of the skull through the vertex without intravenous contrast. COMPARISON:  None. FINDINGS: Brain: No evidence of acute cortical infarction. No hemorrhage, hydrocephalus, or masslike findings. There is advanced small-vessel type ischemic injury in the cerebral white matter and likely right pons. In general these changes are chronic, but specific areas  of insults are age-indeterminate by  CT. Small remote appearing high right frontal cortex infarct. Vascular: Atherosclerotic calcification. Vessels appear large in tortuous, which can be a sign of chronic hypertension. Skull: No acute or aggressive finding. Sinuses/Orbits: Negative. IMPRESSION: 1. No acute hemorrhage or definite acute infarct. 2. Advanced chronic small vessel ischemia which could easily obscure an acute white matter or deep gray nucleus infarct. 3. Small remote appearing right frontal cortex infarct. Electronically Signed   By: Marnee Spring M.D.   On: 10/17/2016 09:42   Mr Maxine Glenn Head Wo Contrast  Result Date: 10/17/2016 CLINICAL DATA:  Initial evaluation for acute right-sided weakness, right-sided facial droop. EXAM: MRI HEAD WITHOUT AND WITH CONTRAST MRA HEAD WITHOUT CONTRAST MRA NECK WITHOUT AND WITH CONTRAST TECHNIQUE: Multiplanar, multiecho pulse sequences of the brain and surrounding structures were obtained without intravenous contrast. Angiographic images of the Circle of Willis were obtained using MRA technique without intravenous contrast. Angiographic images of the neck were obtained using MRA technique without and with intravenous contrast. Carotid stenosis measurements (when applicable) are obtained utilizing NASCET criteria, using the distal internal carotid diameter as the denominator. CONTRAST:  20mL MULTIHANCE GADOBENATE DIMEGLUMINE 529 MG/ML IV SOLN COMPARISON:  Prior CT from earlier same day. FINDINGS: MRI HEAD FINDINGS Study degraded by motion artifact. Diffuse prominence of the CSF containing spaces compatible with generalized cerebral atrophy. Extensive patchy and confluent T2/FLAIR hyperintensity within the periventricular and deep white matter both cerebral hemispheres, consistent with chronic microvascular disease. Superimposed remote lacunar infarcts present within the bilateral basal ganglia as well as the periventricular and deep white matter both cerebral hemispheres. Remote lacunar infarct present  within the pons and middle cerebellar peduncle is as well. Approximate 2 cm acute ischemic infarct seen involving the posterior limb of the left and sternal capsule extending into the posterior left corona radiata (series 3, image 27). Patchy extension into the left subinsular white matter (series 3, image 24). Additional 5 mm ischemic infarct at the posterior left mesial temporal lobe (series 3, image 21). Few additional punctate cortical and subcortical infarcts seen involving the right cerebral hemisphere (series 3, image 33, 16) as well as the superior left cerebral hemisphere (series 3, image 35). Probable additional acute/early subacute punctate left cerebellar infarct (series 7, image 14). No associated hemorrhage or mass effect. The no evidence for acute intracranial hemorrhage. Single subcentimeter chronic microhemorrhage noted at the right external capsule (series 10, image 16). No mass lesion, midline shift or mass effect. No hydrocephalus. No extra-axial fluid collection. Major dural sinuses patent. No abnormal enhancement. Pituitary gland normal. Right vertebral artery diminutive and not well visualized. Major intracranial vascular flow voids otherwise maintained. Craniocervical junction normal. Upper cervical spine within normal limits. Bone marrow signal intensity normal. No scalp soft tissue abnormality. Globes and orbital soft tissues within normal limits. Mild mucosal thickening within the ethmoidal air cells and maxillary sinuses. Superimposed small left maxillary sinus retention cyst noted. Mastoids are clear. Inner ear structures normal. MRA HEAD FINDINGS ANTERIOR CIRCULATION: Study moderately degraded by motion artifact. Petrous, cavernous, and supraclinoid segments of the internal carotid arteries are patent without flow-limiting stenosis. Origin of the ophthalmic arteries patent bilaterally. ICA termini widely patent. A1 segments patent bilaterally. Anterior communicating artery not well seen  on this motion degraded exam. There is an apparent focal severe stenosis of the mid-distal left A2 segment (series 6, image 116). Possible additional focal moderate stenosis involving the distal right A2 segment (series 6, image 133). M1 segments patent without stenosis or occlusion.  MCA bifurcations normal. No obvious proximal M2 occlusion. Possible short-segment moderate to severe proximal left M2 stenosis noted (series 604, image 13). Distal MCA branches not well evaluated on this motion degraded exam, but are grossly symmetric and well perfused. POSTERIOR CIRCULATION: Left vertebral artery dominant and widely patent to the vertebrobasilar junction. Right vertebral artery diffusely hypoplastic and patent as well without obvious stenosis. Origin of the posterior inferior cerebral arteries not seen on this exam. Basilar artery mildly tortuous but widely patent to its distal aspect without stenosis. Superior cerebral arteries patent proximally. Both of the posterior cerebral arteries primarily supplied via the basilar. PCAs widely patent proximally, not well evaluated distally due to motion. No aneurysm or vascular malformation. MRA NECK FINDINGS Source images reviewed. Visualized aortic arch of normal caliber with normal 3 vessel morphology. No flow-limiting stenosis about the origin of the great vessels. Visualized subclavian arteries widely patent. Right common and internal carotid artery is widely patent within the neck without high-grade or critical stenosis. No significant atheromatous narrowing about the right carotid bifurcation. Left common and internal carotid artery's widely patent within the neck without high-grade or critical stenosis. Mild atheromatous irregularity about the left carotid bifurcation without flow-limiting stenosis. Both vertebral arteries arise from the subclavian arteries. Left vertebral artery dominant with a diffusely hypoplastic right vertebral artery. Short-segment moderate  stenosis at the proximal right P1 segment (series 16109, image 10). Mild atheromatous irregularity within the distal right V2/V3 segments. Pre foraminal left V1 segment tortuous. No other high-grade or flow-limiting stenosis within the vertebral arteries bilaterally. IMPRESSION: MRI HEAD IMPRESSION: 1. Multifocal acute to early subacute ischemic infarcts involving the bilateral cerebral and left cerebellar hemispheres as above, most prominent of which is a 2 cm infarct at the posterior left lentiform nucleus/corona radiata. No associated hemorrhage or mass effect. Possible central thromboembolic etiology suspected given the various vascular distributions involved. 2. Generalized age-related cerebral atrophy with advanced chronic microvascular ischemic disease. MRA HEAD IMPRESSION: 1. Negative intracranial MRA. No large or proximal arterial branch occlusion. No high-grade or correctable stenosis. 2. Question short-segment moderate to severe bilateral A2 and proximal left M2 stenoses, somewhat limited in evaluation due to extensive motion artifact on this exam. MRA NECK IMPRESSION: 1. Widely patent carotid artery systems bilaterally without high-grade or critical flow limiting stenosis. 2. Short-segment moderate proximal right V1 stenosis. Vertebral arteries otherwise widely patent within the neck. Left vertebral artery is dominant with a diffusely hypoplastic right vertebral artery. Electronically Signed   By: Rise Mu M.D.   On: 10/17/2016 15:24   Mr Angiogram Neck W Or Wo Contrast  Result Date: 10/17/2016 CLINICAL DATA:  Initial evaluation for acute right-sided weakness, right-sided facial droop. EXAM: MRI HEAD WITHOUT AND WITH CONTRAST MRA HEAD WITHOUT CONTRAST MRA NECK WITHOUT AND WITH CONTRAST TECHNIQUE: Multiplanar, multiecho pulse sequences of the brain and surrounding structures were obtained without intravenous contrast. Angiographic images of the Circle of Willis were obtained using MRA  technique without intravenous contrast. Angiographic images of the neck were obtained using MRA technique without and with intravenous contrast. Carotid stenosis measurements (when applicable) are obtained utilizing NASCET criteria, using the distal internal carotid diameter as the denominator. CONTRAST:  20mL MULTIHANCE GADOBENATE DIMEGLUMINE 529 MG/ML IV SOLN COMPARISON:  Prior CT from earlier same day. FINDINGS: MRI HEAD FINDINGS Study degraded by motion artifact. Diffuse prominence of the CSF containing spaces compatible with generalized cerebral atrophy. Extensive patchy and confluent T2/FLAIR hyperintensity within the periventricular and deep white matter both cerebral hemispheres, consistent with chronic  microvascular disease. Superimposed remote lacunar infarcts present within the bilateral basal ganglia as well as the periventricular and deep white matter both cerebral hemispheres. Remote lacunar infarct present within the pons and middle cerebellar peduncle is as well. Approximate 2 cm acute ischemic infarct seen involving the posterior limb of the left and sternal capsule extending into the posterior left corona radiata (series 3, image 27). Patchy extension into the left subinsular white matter (series 3, image 24). Additional 5 mm ischemic infarct at the posterior left mesial temporal lobe (series 3, image 21). Few additional punctate cortical and subcortical infarcts seen involving the right cerebral hemisphere (series 3, image 33, 16) as well as the superior left cerebral hemisphere (series 3, image 35). Probable additional acute/early subacute punctate left cerebellar infarct (series 7, image 14). No associated hemorrhage or mass effect. The no evidence for acute intracranial hemorrhage. Single subcentimeter chronic microhemorrhage noted at the right external capsule (series 10, image 16). No mass lesion, midline shift or mass effect. No hydrocephalus. No extra-axial fluid collection. Major dural  sinuses patent. No abnormal enhancement. Pituitary gland normal. Right vertebral artery diminutive and not well visualized. Major intracranial vascular flow voids otherwise maintained. Craniocervical junction normal. Upper cervical spine within normal limits. Bone marrow signal intensity normal. No scalp soft tissue abnormality. Globes and orbital soft tissues within normal limits. Mild mucosal thickening within the ethmoidal air cells and maxillary sinuses. Superimposed small left maxillary sinus retention cyst noted. Mastoids are clear. Inner ear structures normal. MRA HEAD FINDINGS ANTERIOR CIRCULATION: Study moderately degraded by motion artifact. Petrous, cavernous, and supraclinoid segments of the internal carotid arteries are patent without flow-limiting stenosis. Origin of the ophthalmic arteries patent bilaterally. ICA termini widely patent. A1 segments patent bilaterally. Anterior communicating artery not well seen on this motion degraded exam. There is an apparent focal severe stenosis of the mid-distal left A2 segment (series 6, image 116). Possible additional focal moderate stenosis involving the distal right A2 segment (series 6, image 133). M1 segments patent without stenosis or occlusion. MCA bifurcations normal. No obvious proximal M2 occlusion. Possible short-segment moderate to severe proximal left M2 stenosis noted (series 604, image 13). Distal MCA branches not well evaluated on this motion degraded exam, but are grossly symmetric and well perfused. POSTERIOR CIRCULATION: Left vertebral artery dominant and widely patent to the vertebrobasilar junction. Right vertebral artery diffusely hypoplastic and patent as well without obvious stenosis. Origin of the posterior inferior cerebral arteries not seen on this exam. Basilar artery mildly tortuous but widely patent to its distal aspect without stenosis. Superior cerebral arteries patent proximally. Both of the posterior cerebral arteries primarily  supplied via the basilar. PCAs widely patent proximally, not well evaluated distally due to motion. No aneurysm or vascular malformation. MRA NECK FINDINGS Source images reviewed. Visualized aortic arch of normal caliber with normal 3 vessel morphology. No flow-limiting stenosis about the origin of the great vessels. Visualized subclavian arteries widely patent. Right common and internal carotid artery is widely patent within the neck without high-grade or critical stenosis. No significant atheromatous narrowing about the right carotid bifurcation. Left common and internal carotid artery's widely patent within the neck without high-grade or critical stenosis. Mild atheromatous irregularity about the left carotid bifurcation without flow-limiting stenosis. Both vertebral arteries arise from the subclavian arteries. Left vertebral artery dominant with a diffusely hypoplastic right vertebral artery. Short-segment moderate stenosis at the proximal right P1 segment (series 04540, image 10). Mild atheromatous irregularity within the distal right V2/V3 segments. Pre foraminal left V1  segment tortuous. No other high-grade or flow-limiting stenosis within the vertebral arteries bilaterally. IMPRESSION: MRI HEAD IMPRESSION: 1. Multifocal acute to early subacute ischemic infarcts involving the bilateral cerebral and left cerebellar hemispheres as above, most prominent of which is a 2 cm infarct at the posterior left lentiform nucleus/corona radiata. No associated hemorrhage or mass effect. Possible central thromboembolic etiology suspected given the various vascular distributions involved. 2. Generalized age-related cerebral atrophy with advanced chronic microvascular ischemic disease. MRA HEAD IMPRESSION: 1. Negative intracranial MRA. No large or proximal arterial branch occlusion. No high-grade or correctable stenosis. 2. Question short-segment moderate to severe bilateral A2 and proximal left M2 stenoses, somewhat limited  in evaluation due to extensive motion artifact on this exam. MRA NECK IMPRESSION: 1. Widely patent carotid artery systems bilaterally without high-grade or critical flow limiting stenosis. 2. Short-segment moderate proximal right V1 stenosis. Vertebral arteries otherwise widely patent within the neck. Left vertebral artery is dominant with a diffusely hypoplastic right vertebral artery. Electronically Signed   By: Rise Mu M.D.   On: 10/17/2016 15:24   Mr Laqueta Jean WU Contrast  Result Date: 10/17/2016 CLINICAL DATA:  Initial evaluation for acute right-sided weakness, right-sided facial droop. EXAM: MRI HEAD WITHOUT AND WITH CONTRAST MRA HEAD WITHOUT CONTRAST MRA NECK WITHOUT AND WITH CONTRAST TECHNIQUE: Multiplanar, multiecho pulse sequences of the brain and surrounding structures were obtained without intravenous contrast. Angiographic images of the Circle of Willis were obtained using MRA technique without intravenous contrast. Angiographic images of the neck were obtained using MRA technique without and with intravenous contrast. Carotid stenosis measurements (when applicable) are obtained utilizing NASCET criteria, using the distal internal carotid diameter as the denominator. CONTRAST:  20mL MULTIHANCE GADOBENATE DIMEGLUMINE 529 MG/ML IV SOLN COMPARISON:  Prior CT from earlier same day. FINDINGS: MRI HEAD FINDINGS Study degraded by motion artifact. Diffuse prominence of the CSF containing spaces compatible with generalized cerebral atrophy. Extensive patchy and confluent T2/FLAIR hyperintensity within the periventricular and deep white matter both cerebral hemispheres, consistent with chronic microvascular disease. Superimposed remote lacunar infarcts present within the bilateral basal ganglia as well as the periventricular and deep white matter both cerebral hemispheres. Remote lacunar infarct present within the pons and middle cerebellar peduncle is as well. Approximate 2 cm acute ischemic  infarct seen involving the posterior limb of the left and sternal capsule extending into the posterior left corona radiata (series 3, image 27). Patchy extension into the left subinsular white matter (series 3, image 24). Additional 5 mm ischemic infarct at the posterior left mesial temporal lobe (series 3, image 21). Few additional punctate cortical and subcortical infarcts seen involving the right cerebral hemisphere (series 3, image 33, 16) as well as the superior left cerebral hemisphere (series 3, image 35). Probable additional acute/early subacute punctate left cerebellar infarct (series 7, image 14). No associated hemorrhage or mass effect. The no evidence for acute intracranial hemorrhage. Single subcentimeter chronic microhemorrhage noted at the right external capsule (series 10, image 16). No mass lesion, midline shift or mass effect. No hydrocephalus. No extra-axial fluid collection. Major dural sinuses patent. No abnormal enhancement. Pituitary gland normal. Right vertebral artery diminutive and not well visualized. Major intracranial vascular flow voids otherwise maintained. Craniocervical junction normal. Upper cervical spine within normal limits. Bone marrow signal intensity normal. No scalp soft tissue abnormality. Globes and orbital soft tissues within normal limits. Mild mucosal thickening within the ethmoidal air cells and maxillary sinuses. Superimposed small left maxillary sinus retention cyst noted. Mastoids are clear. Inner ear structures  normal. MRA HEAD FINDINGS ANTERIOR CIRCULATION: Study moderately degraded by motion artifact. Petrous, cavernous, and supraclinoid segments of the internal carotid arteries are patent without flow-limiting stenosis. Origin of the ophthalmic arteries patent bilaterally. ICA termini widely patent. A1 segments patent bilaterally. Anterior communicating artery not well seen on this motion degraded exam. There is an apparent focal severe stenosis of the mid-distal  left A2 segment (series 6, image 116). Possible additional focal moderate stenosis involving the distal right A2 segment (series 6, image 133). M1 segments patent without stenosis or occlusion. MCA bifurcations normal. No obvious proximal M2 occlusion. Possible short-segment moderate to severe proximal left M2 stenosis noted (series 604, image 13). Distal MCA branches not well evaluated on this motion degraded exam, but are grossly symmetric and well perfused. POSTERIOR CIRCULATION: Left vertebral artery dominant and widely patent to the vertebrobasilar junction. Right vertebral artery diffusely hypoplastic and patent as well without obvious stenosis. Origin of the posterior inferior cerebral arteries not seen on this exam. Basilar artery mildly tortuous but widely patent to its distal aspect without stenosis. Superior cerebral arteries patent proximally. Both of the posterior cerebral arteries primarily supplied via the basilar. PCAs widely patent proximally, not well evaluated distally due to motion. No aneurysm or vascular malformation. MRA NECK FINDINGS Source images reviewed. Visualized aortic arch of normal caliber with normal 3 vessel morphology. No flow-limiting stenosis about the origin of the great vessels. Visualized subclavian arteries widely patent. Right common and internal carotid artery is widely patent within the neck without high-grade or critical stenosis. No significant atheromatous narrowing about the right carotid bifurcation. Left common and internal carotid artery's widely patent within the neck without high-grade or critical stenosis. Mild atheromatous irregularity about the left carotid bifurcation without flow-limiting stenosis. Both vertebral arteries arise from the subclavian arteries. Left vertebral artery dominant with a diffusely hypoplastic right vertebral artery. Short-segment moderate stenosis at the proximal right P1 segment (series 81191, image 10). Mild atheromatous irregularity  within the distal right V2/V3 segments. Pre foraminal left V1 segment tortuous. No other high-grade or flow-limiting stenosis within the vertebral arteries bilaterally. IMPRESSION: MRI HEAD IMPRESSION: 1. Multifocal acute to early subacute ischemic infarcts involving the bilateral cerebral and left cerebellar hemispheres as above, most prominent of which is a 2 cm infarct at the posterior left lentiform nucleus/corona radiata. No associated hemorrhage or mass effect. Possible central thromboembolic etiology suspected given the various vascular distributions involved. 2. Generalized age-related cerebral atrophy with advanced chronic microvascular ischemic disease. MRA HEAD IMPRESSION: 1. Negative intracranial MRA. No large or proximal arterial branch occlusion. No high-grade or correctable stenosis. 2. Question short-segment moderate to severe bilateral A2 and proximal left M2 stenoses, somewhat limited in evaluation due to extensive motion artifact on this exam. MRA NECK IMPRESSION: 1. Widely patent carotid artery systems bilaterally without high-grade or critical flow limiting stenosis. 2. Short-segment moderate proximal right V1 stenosis. Vertebral arteries otherwise widely patent within the neck. Left vertebral artery is dominant with a diffusely hypoplastic right vertebral artery. Electronically Signed   By: Rise Mu M.D.   On: 10/17/2016 15:24   Nm Myocar Single W/planar W/[place] Motion And Ef  Result Date: 10/20/2016 CLINICAL DATA:  61 year old male with chest pain. EXAM: MYOCARDIAL IMAGING WITH SPECT (REST AND PHARMACOLOGIC-STRESS) GATED LEFT VENTRICULAR [place] MOTION STUDY LEFT VENTRICULAR EJECTION FRACTION TECHNIQUE: Standard myocardial SPECT imaging was performed after resting intravenous injection of 10 mCi Tc-10m tetrofosmin. Subsequently, intravenous infusion of Lexiscan was performed under the supervision of the Cardiology staff. At peak effect  of the drug, 30 mCi Tc-64m tetrofosmin was  injected intravenously and standard myocardial SPECT imaging was performed. Quantitative gated imaging was also performed to evaluate left ventricular Streeter motion, and estimate left ventricular ejection fraction. COMPARISON:  Chest radiograph 10/17/2016 FINDINGS: Perfusion: No decreased activity in the left ventricle on stress imaging to suggest reversible ischemia or infarction. Blaze Motion: The LEFT ventricle is dilated at rest and stress. Global hypokinesia. No focal Herling motion abnormality Left Ventricular Ejection Fraction: 43 % End diastolic volume 150 ml End systolic volume 86 ml IMPRESSION: 1. No reversible ischemia or infarction. 2. LEFT ventricle dilatation and global hypokinesia. 3. Left ventricular ejection fraction 43% 4. Non invasive risk stratification*: Intermediate (classification due to low ejection fraction) *2012 Appropriate Use Criteria for Coronary Revascularization Focused Update: J Am Coll Cardiol. 2012;59(9):857-881. http://content.dementiazones.com.aspx?articleid=[email or web address] Electronically Signed   By: Genevive Bi M.D.   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 Hemiplegia due to recent cerebrovascular accident (CVA) (HCC) I69.359    right flaccid  3. Hx of heart surgery Z98.890    for large PFO closure 10/23/16  4. Essential hypertension I10   5. Hyperlipidemia LDL goal <70 E78.5   6. CKD (chronic kidney disease) stage 3, GFR 30-59 ml/min (HCC) N18.3   7. Depression with anxiety F41.8   8. ETOH abuse F10.10      Check CMP  Cont current meds as ordered  PT/OT/ST as ordered  F/u with specialists as ordered  Etoh use discussed and cessation highly urged  GOAL: short term rehab and d/c home when medically appropriate. Communicated with pt and nursing.  Will follow  Dasja Brase S. Ancil Linsey  [facility] [address] [place], [place]  [address] [phone] Cell (Monday-Friday 8 AM - 5 PM) [phone] After 5 PM and follow prompts

## 2016-11-07 DIAGNOSIS — I129 Hypertensive chronic kidney disease with stage 1 through stage 4 chronic kidney disease, or unspecified chronic kidney disease: Secondary | ICD-10-CM | POA: Diagnosis not present

## 2016-11-07 DIAGNOSIS — I6930 Unspecified sequelae of cerebral infarction: Secondary | ICD-10-CM | POA: Diagnosis not present

## 2016-11-07 DIAGNOSIS — I10 Essential (primary) hypertension: Secondary | ICD-10-CM | POA: Diagnosis not present

## 2016-11-07 DIAGNOSIS — N183 Chronic kidney disease, stage 3 (moderate): Secondary | ICD-10-CM | POA: Diagnosis not present

## 2016-11-08 DIAGNOSIS — N183 Chronic kidney disease, stage 3 unspecified: Secondary | ICD-10-CM | POA: Insufficient documentation

## 2016-11-08 HISTORY — DX: Chronic kidney disease, stage 3 unspecified: N18.30

## 2016-11-14 DIAGNOSIS — Z9889 Other specified postprocedural states: Secondary | ICD-10-CM | POA: Insufficient documentation

## 2016-11-29 ENCOUNTER — Encounter: Payer: Self-pay | Admitting: Adult Health

## 2016-11-29 ENCOUNTER — Non-Acute Institutional Stay (SKILLED_NURSING_FACILITY): Payer: Medicaid Other | Admitting: Adult Health

## 2016-11-29 DIAGNOSIS — N183 Chronic kidney disease, stage 3 unspecified: Secondary | ICD-10-CM

## 2016-11-29 DIAGNOSIS — K5909 Other constipation: Secondary | ICD-10-CM | POA: Diagnosis not present

## 2016-11-29 DIAGNOSIS — Q211 Atrial septal defect: Secondary | ICD-10-CM | POA: Diagnosis not present

## 2016-11-29 DIAGNOSIS — Q2112 Patent foramen ovale: Secondary | ICD-10-CM

## 2016-11-29 NOTE — Progress Notes (Signed)
Location:   [facility] Room Number: 119 A Place of Service:  SNF (31)   CODE STATUS: Full code  No Known Allergies  Chief Complaint  Patient presents with  . Medical Management of Chronic Issues    Constipation; PFO; chronic renal disease stage III    HPI:  He is a 61 year old long term resident of this facility being seen for the management of his chronic illnesses: PFO; CKD stage 3; and chronic constipation.  He tells me that he is feeling good. He denies any constipation; no chest pain; no shortness of breath; no worsening fatigue. There are no nursing concerns at this time.   Past Medical History:  Diagnosis Date  . Depression   . Hx of pleural effusion   . Hyperlipidemia   . Hypertension     Past Surgical History:  Procedure Laterality Date  . PATENT FORAMEN OVALE(PFO) CLOSURE N/A 10/23/2016   Procedure: Patent [place](PFO) Closure;  Surgeon: Yates DecampGanji, Jay, MD;  Location: [facility] INVASIVE CV LAB;  Service: Cardiovascular;  Laterality: N/A;  . TEE WITHOUT CARDIOVERSION N/A 10/19/2016   Procedure: TRANSESOPHAGEAL ECHOCARDIOGRAM (TEE);  Surgeon: Yates DecampGanji, Jay, MD;  [facility];  Service: Cardiovascular;  Laterality: N/A;  . THORACENTESIS    . ULTRASOUND GUIDANCE FOR VASCULAR ACCESS  10/23/2016   Procedure: Ultrasound Guidance For Vascular Access;  Surgeon: Yates DecampGanji, Jay, MD;  [facility];  Service: Cardiovascular;;    Social History   Social History  . Marital status: Legally Separated    Spouse name: N/A  . Number of children: N/A  . Years of education: N/A   Occupational History  . Not on file.   Social History Main Topics  . Smoking status: Never Smoker  . Smokeless tobacco: Never Used  . Alcohol use Yes  . Drug use: No  . Sexual activity: No   Other Topics Concern  . Not on file   Social History Narrative  . No narrative on file   History reviewed. No pertinent family history.    VITAL SIGNS BP 113/75   Pulse 84    Temp (!) 97.2 F (36.2 C)   Resp 18   Ht 5\' 11"  (1.803 m)   Wt 245 lb 1.6 oz (111.2 kg)   SpO2 95%   BMI 34.18 kg/m   Patient's Medications  New Prescriptions   No medications on file  Previous Medications   ACETAMINOPHEN (TYLENOL) 325 MG TABLET    Take 650 mg by mouth every 4 (four) hours as needed for mild pain or moderate pain.   AMLODIPINE (NORVASC) 10 MG TABLET    Take 1 tablet (10 mg total) by mouth daily.   ASPIRIN 81 MG CHEWABLE TABLET    Chew 1 tablet (81 mg total) by mouth daily.   ATORVASTATIN (LIPITOR) 40 MG TABLET    Take 1 tablet (40 mg total) by mouth daily at 6 PM.   CLOPIDOGREL (PLAVIX) 75 MG TABLET    Take 1 tablet (75 mg total) by mouth daily with breakfast.   DICLOFENAC SODIUM (VOLTAREN) 1 % GEL    Apply 2 g topically 3 (three) times daily.   DIPHENHYDRAMINE (BENADRYL) 25 MG CAPSULE    Take 25 mg by mouth every 6 (six) hours as needed for allergies.   HYDRALAZINE (APRESOLINE) 25 MG TABLET    Take 1 tablet (25 mg total) by mouth every 8 (eight) hours.   METOPROLOL TARTRATE (LOPRESSOR) 50 MG TABLET    Take  1 tablet (50 mg total) by mouth 2 (two) times daily.   SENNA-DOCUSATE (SENOKOT-S) 8.6-50 MG TABLET    Take 1 tablet by mouth 2 (two) times daily.   TIZANIDINE (ZANAFLEX) 2 MG TABLET    Take 2 mg by mouth at bedtime.   TRAMADOL (ULTRAM) 50 MG TABLET    Take 50 mg by mouth every 8 (eight) hours as needed.  Modified Medications   No medications on file  Discontinued Medications   ALPRAZOLAM (XANAX) 0.5 MG TABLET    Take 1 tablet (0.5 mg total) by mouth 2 (two) times daily as needed for anxiety.     SIGNIFICANT DIAGNOSTIC EXAMS  PREVIOUS:   10-17-16: ct head: 1. No acute hemorrhage or definite acute infarct. 2. Advanced chronic small vessel ischemia which could easily obscure an acute white matter or deep gray nucleus infarct. 3. Small remote appearing right frontal cortex infarct.  10-17-16: chest x-ray: No active cardiopulmonary disease.   10-17-16: MRI  head; MRA head and neck:   MRI HEAD IMPRESSION:  1. Multifocal acute to early subacute ischemic infarcts involving the bilateral cerebral and left cerebellar hemispheres as above, most prominent of which is a 2 cm infarct at the posterior left lentiform nucleus/corona radiata. No associated hemorrhage or mass effect. Possible central thromboembolic etiology suspected given the various vascular distributions involved. 2. Generalized age-related cerebral atrophy with advanced chronic microvascular ischemic disease.   MRA HEAD IMPRESSION: 1. Negative intracranial MRA. No large or proximal arterial branch occlusion. No high-grade or correctable stenosis. 2. Question short-segment moderate to severe bilateral A2 and proximal left M2 stenoses, somewhat limited in evaluation due to extensive motion artifact on this exam.   MRA NECK IMPRESSION: 1. Widely patent carotid artery systems bilaterally without high-grade or critical flow limiting stenosis. 2. Short-segment moderate proximal right V1 stenosis. Vertebral arteries otherwise widely patent within the neck. 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  10-18-16: carotid doppler:  - The vertebral arteries appear patent with antegrade flow. - Findings consistent with a 1- 39 percent stenosis involving the right internal carotid artery and the left internal carotid artery.  10-18-16: right ankle x-ray: No acute osseous abnormality.  10-19-16: TEE:  - Left ventricle: Systolic function was normal. [place] motion was normal; there were no regional Augustine motion abnormalities. - Mitral valve: There is mild redundant chordae with systolic anterior motion of the chordae without LVOT obstruction. Trivial prolapse, involving the anterior leaflet. No significant myxomatous degeneration. There was trivial regurgitation. - Left atrium: No evidence of thrombus in the atrial cavity or appendage. - Right atrium: No evidence of  thrombus in the atrial cavity or appendage. - Atrial septum: There was a very large patent foramen ovale. Agitated saline contrast study showed a large right-to-left atrial level shunt, in the baseline state. Agitated saline contrast study showed a large right-to-left atrial level shunt, following an increase in RA pressure induced by abdominal compression. - Impressions: A Very large PFO is probable source of the paradoxical embolic stroke.  10-20-16: bilateral lower extremity doppler: No evidence of deep vein or superficial thrombosis involving the right lower extremity and left lower extremity.  10-20-16: myoview:  1. No reversible ischemia or infarction. 2. LEFT ventricle dilatation and global hypokinesia. 3. Left ventricular ejection fraction 43% 4. Non invasive risk stratification*: Intermediate (classification due to low ejection fraction)  10-23-16: patent foramen ovale closure   LABS REVIEWED: PREVIOUS:  10-17-16: wbc 12.3; hgb 17.8; hct 48.4; mcv 94.2; plt 257;  glucose 101; bun 48; creat 1.80; k+ 3.4; na++ 142; ca 9.2; total bili 1.4; ast 59; albumin 3.7 10-18-16: wbc 9.3; hgb 15.9; hct 45.2; mcv 96.2; plt 219; glucose 97; bun 33; creat 1.56; k+ 3.5; na++ 143; ca 8.7; hgb a1c 4.7; chol 177; ldl 111; trig 232; hdl 20 1-61-09: wbc 9.3; hgb 17.0; hct 48.6; mcv 97.4; plt 157; glucose 98; bun 26; creat 1.61; k+ 4.2; na++ 136; ca 8.8   NO NEW LABS    Review of Systems  Constitutional: Negative for malaise/fatigue.  Respiratory: Negative for cough and shortness of breath.   Cardiovascular: Negative for chest pain, palpitations and leg swelling.  Gastrointestinal: Negative for abdominal pain, constipation and heartburn.  Musculoskeletal: Negative for back pain, joint pain and myalgias.  Skin: Negative.   Neurological: Negative for dizziness.  Psychiatric/Behavioral: The patient is not nervous/anxious.     Physical Exam  Constitutional: No distress.  Overweight   Eyes: Conjunctivae  are normal.  Neck: Neck supple. No thyromegaly present.  Cardiovascular:  Murmur heard. Pulmonary/Chest: Effort normal and breath sounds normal. No respiratory distress.  Abdominal: Soft. Bowel sounds are normal. He exhibits no distension. There is no tenderness.  Musculoskeletal: He exhibits edema.  Has right side hemiparesis Has trace lower extremity edema    Lymphadenopathy:    He has no cervical adenopathy.  Neurological: He is alert.  Skin: Skin is warm and dry. He is not diaphoretic.  Psychiatric: He has a normal mood and affect.    ASSESSMENT/ PLAN:  TODAY:   1. Constipation: stable will continue senna s twice daily   2. PFO (patent foramen ovale) is stable is status post closure (10-23-16): EF 60-65%: will not make changes and will monitor his status.   3. Chronic renal disease stage III: without change: bun 26; creat 1.61  PREVIOUS   4. CVA with right hemiplegia: is stable will continue with therapy as directed and will follow up with neurology. Will continue asa 81 mg daily and plavix 75 mg daily  5. Hypertension: stable b/p 113/75: will continue norvasc 10 mg daily; apresoline 50 mg every 8 hours; lopressor 50 mg twice daily   6. Dyslipidemia: stable ldl 111 will continue lipitor 40 mg daily    MD is aware of resident's narcotic use and is in agreement with current plan of care. We will attempt to wean resident as apropriate     Synthia Innocent NP [facility]  Contact [phone] Monday through Friday 8am- 5pm  After hours call [phone]

## 2016-12-20 DIAGNOSIS — K5909 Other constipation: Secondary | ICD-10-CM | POA: Insufficient documentation

## 2016-12-25 ENCOUNTER — Non-Acute Institutional Stay (SKILLED_NURSING_FACILITY): Payer: Medicaid Other | Admitting: Adult Health

## 2016-12-25 ENCOUNTER — Encounter: Payer: Self-pay | Admitting: Adult Health

## 2016-12-25 DIAGNOSIS — M25511 Pain in right shoulder: Secondary | ICD-10-CM | POA: Diagnosis not present

## 2016-12-25 NOTE — Progress Notes (Signed)
Location:   [facility] Room Number: 119 A Place of Service:  SNF (31)   CODE STATUS: Full Code  No Known Allergies  Chief Complaint  Patient presents with  . Acute Visit    Right shoulder pain    HPI:  He is having right shoulder pain to his cva affected side. He does have pain with passive range of motion especially with extension. He does have tenderness to palpation. The therapy staff is concerned about an impingement/rotator cuff.   Past Medical History:  Diagnosis Date  . Depression   . Hx of pleural effusion   . Hyperlipidemia   . Hypertension     Past Surgical History:  Procedure Laterality Date  . PATENT FORAMEN OVALE(PFO) CLOSURE N/A 10/23/2016   Procedure: Patent Forament Ovale(PFO) Closure;  Surgeon: Yates DecampGanji, Jay, MD;  [facility]: [facility];  Service: Cardiovascular;  Laterality: N/A;  . TEE WITHOUT CARDIOVERSION N/A 10/19/2016   Procedure: TRANSESOPHAGEAL ECHOCARDIOGRAM (TEE);  Surgeon: Yates DecampGanji, Jay, MD;  [facility];  Service: Cardiovascular;  Laterality: N/A;  . THORACENTESIS    . ULTRASOUND GUIDANCE FOR VASCULAR ACCESS  10/23/2016   Procedure: Ultrasound Guidance For Vascular Access;  Surgeon: Yates DecampGanji, Jay, MD;  [facility];  Service: Cardiovascular;;    Social History   Socioeconomic History  . Marital status: Legally Separated    Spouse name: Not on file  . Number of children: Not on file  . Years of education: Not on file  . Highest education level: Not on file  Social Needs  . Financial resource strain: Not on file  . Food insecurity - worry: Not on file  . Food insecurity - inability: Not on file  . Transportation needs - medical: Not on file  . Transportation needs - non-medical: Not on file  Occupational History  . Not on file  Tobacco Use  . Smoking status: Never Smoker  . Smokeless tobacco: Never Used  Substance and Sexual Activity  . Alcohol use: Yes  . Drug use: No  . Sexual activity: No    Other Topics Concern  . Not on file  Social History Narrative  . Not on file   History reviewed. No pertinent family history.    VITAL SIGNS BP 124/87   Pulse (!) 51   Temp 99 F (37.2 C)   Resp 16   Ht 5\' 11"  (1.803 m)   Wt 249 lb (112.9 kg)   SpO2 95%   BMI 34.73 kg/m   Outpatient Encounter Medications as of 12/25/2016  Medication Sig  . acetaminophen (TYLENOL) 325 MG tablet Take 650 mg by mouth every 4 (four) hours as needed for mild pain or moderate pain.  Marland Kitchen. amLODipine (NORVASC) 10 MG tablet Take 1 tablet (10 mg total) by mouth daily.  Marland Kitchen. aspirin EC 81 MG tablet Take 81 mg by mouth daily.  Marland Kitchen. atorvastatin (LIPITOR) 40 MG tablet Take 1 tablet (40 mg total) by mouth daily at 6 PM.  . clopidogrel (PLAVIX) 75 MG tablet Take 1 tablet (75 mg total) by mouth daily with breakfast.  . diclofenac sodium (VOLTAREN) 1 % GEL Apply 2 g topically 3 (three) times daily.  . diphenhydrAMINE (BENADRYL) 25 mg capsule Take 25 mg by mouth every 6 (six) hours as needed for allergies.  . hydrALAZINE (APRESOLINE) 25 MG tablet Take 1 tablet (25 mg total) by mouth every 8 (eight) hours.  . metoprolol tartrate (LOPRESSOR) 50 MG tablet Take 1 tablet (50 mg total)  by mouth 2 (two) times daily.  Marland Kitchen senna-docusate (SENOKOT-S) 8.6-50 MG tablet Take 1 tablet by mouth 2 (two) times daily.  Marland Kitchen tiZANidine (ZANAFLEX) 2 MG tablet Take 2 mg by mouth at bedtime.  . traMADol (ULTRAM) 50 MG tablet Take 50 mg by mouth every 8 (eight) hours as needed.  . [DISCONTINUED] aspirin 81 MG chewable tablet Chew 1 tablet (81 mg total) by mouth daily. (Patient not taking: Reported on 12/25/2016)   No facility-administered encounter medications on file as of 12/25/2016.      SIGNIFICANT DIAGNOSTIC EXAMS  PREVIOUS:   10-17-16: ct head: 1. No acute hemorrhage or definite acute infarct. 2. Advanced chronic small vessel ischemia which could easily obscure an acute white matter or deep gray nucleus infarct. 3. Small remote  appearing right frontal cortex infarct.  10-17-16: chest x-ray: No active cardiopulmonary disease.   10-17-16: MRI head; MRA head and neck:   MRI HEAD IMPRESSION:  1. Multifocal acute to early subacute ischemic infarcts involving the bilateral cerebral and left cerebellar hemispheres as above, most prominent of which is a 2 cm infarct at the posterior left lentiform nucleus/corona radiata. No associated hemorrhage or mass effect. Possible central thromboembolic etiology suspected given the various vascular distributions involved. 2. Generalized age-related cerebral atrophy with advanced chronic microvascular ischemic disease.   MRA HEAD IMPRESSION: 1. Negative intracranial MRA. No large or proximal arterial branch occlusion. No high-grade or correctable stenosis. 2. Question short-segment moderate to severe bilateral A2 and proximal left M2 stenoses, somewhat limited in evaluation due to extensive motion artifact on this exam.   MRA NECK IMPRESSION: 1. Widely patent carotid artery systems bilaterally without high-grade or critical flow limiting stenosis. 2. Short-segment moderate proximal right V1 stenosis. Vertebral arteries otherwise widely patent within the neck. 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  10-18-16: carotid doppler:  - The vertebral arteries appear patent with antegrade flow. - Findings consistent with a 1- 39 percent stenosis involving the right internal carotid artery and the left internal carotid artery.  10-18-16: right ankle x-ray: No acute osseous abnormality.  10-19-16: TEE:  - Left ventricle: Systolic function was normal. Virrueta motion was normal; there were no regional [place] motion abnormalities. - Mitral valve: There is mild redundant chordae with systolic anterior motion of the chordae without LVOT obstruction. Trivial prolapse, involving the anterior leaflet. No significant myxomatous degeneration. There was trivial  regurgitation. - Left atrium: No evidence of thrombus in the atrial cavity or appendage. - Right atrium: No evidence of thrombus in the atrial cavity or appendage. - Atrial septum: There was a very large patent foramen ovale. Agitated saline contrast study showed a large right-to-left atrial level shunt, in the baseline state. Agitated saline contrast study showed a large right-to-left atrial level shunt, following an increase in RA pressure induced by abdominal compression. - Impressions: A Very large PFO is probable source of the paradoxical embolic stroke.  10-20-16: bilateral lower extremity doppler: No evidence of deep vein or superficial thrombosis involving the right lower extremity and left lower extremity.  10-20-16: myoview:  1. No reversible ischemia or infarction. 2. LEFT ventricle dilatation and global hypokinesia. 3. Left ventricular ejection fraction 43% 4. Non invasive risk stratification*: Intermediate (classification due to low ejection fraction)  10-23-16: patent foramen ovale closure   LABS REVIEWED: PREVIOUS:  10-17-16: wbc 12.3; hgb 17.8; hct 48.4; mcv 94.2; plt 257; glucose 101; bun 48; creat 1.80; k+ 3.4; na++ 142; ca 9.2; total bili 1.4; ast 59;  albumin 3.7 10-18-16: wbc 9.3; hgb 15.9; hct 45.2; mcv 96.2; plt 219; glucose 97; bun 33; creat 1.56; k+ 3.5; na++ 143; ca 8.7; hgb a1c 4.7; chol 177; ldl 111; trig 232; hdl 20 1-61-099-26-18: wbc 9.3; hgb 17.0; hct 48.6; mcv 97.4; plt 157; glucose 98; bun 26; creat 1.61; k+ 4.2; na++ 136; ca 8.8   NO NEW LABS    Review of Systems  Constitutional: Negative for malaise/fatigue.  Respiratory: Negative for cough and shortness of breath.   Cardiovascular: Negative for chest pain, palpitations and leg swelling.  Gastrointestinal: Negative for abdominal pain, constipation and heartburn.  Musculoskeletal: Positive for joint pain. Negative for back pain and myalgias.       Right shoulder pain   Skin: Negative.   Neurological: Negative  for dizziness.  Psychiatric/Behavioral: The patient is not nervous/anxious.     Physical Exam  Constitutional: He is oriented to person, place, and time. He appears well-developed and well-nourished. No distress.  Overweight   Neck: Neck supple. No thyromegaly present.  Cardiovascular: Normal rate, regular rhythm and intact distal pulses.  Murmur heard. Pulmonary/Chest: Effort normal and breath sounds normal. No respiratory distress.  Abdominal: Soft. Bowel sounds are normal. He exhibits no distension. There is no tenderness.  Musculoskeletal: He exhibits edema.  Has right side hemiparesis Has trace lower extremity edema     Lymphadenopathy:    He has no cervical adenopathy.  Neurological: He is alert and oriented to person, place, and time.  Skin: Skin is warm and dry. He is not diaphoretic.  Psychiatric: He has a normal mood and affect.    ASSESSMENT/ PLAN:  TODAY:   1. Right shoulder pain: will have therapy see her for pain management and will have therapy provide him with a supportive device. Will monitor   MD is aware of resident's narcotic use and is in agreement with current plan of care. We will attempt to wean resident as apropriate     Synthia Innocenteborah Laurice Kimmons NP [facility]  Contact [phone] Monday through Friday 8am- 5pm  After hours call [phone]

## 2016-12-28 ENCOUNTER — Encounter: Payer: Self-pay | Admitting: Adult Health

## 2016-12-28 LAB — BASIC METABOLIC PANEL
BUN: 21 (ref 4–21)
Creatinine: 1.6 — AB (ref 0.6–1.3)
GLUCOSE: 94
Potassium: 4.9 (ref 3.4–5.3)
Sodium: 141 (ref 137–147)

## 2016-12-28 LAB — HEPATIC FUNCTION PANEL
ALT: 21 (ref 10–40)
AST: 21 (ref 14–40)
Alkaline Phosphatase: 67 (ref 25–125)
BILIRUBIN, TOTAL: 0.3

## 2016-12-28 LAB — CBC AND DIFFERENTIAL
HEMATOCRIT: 41 (ref 41–53)
Hemoglobin: 14.1 (ref 13.5–17.5)
NEUTROS ABS: 6
PLATELETS: 196 (ref 150–399)
WBC: 9.2

## 2016-12-28 NOTE — Progress Notes (Signed)
Location:   [facility] Room Number: 119 A Place of Service:  SNF (31)   CODE STATUS: Full code  No Known Allergies  Chief Complaint  Patient presents with  . Acute Visit    Elevated HR, Increased Shortness of Breath    HPI:    Past Medical History:  Diagnosis Date  . Depression   . Hx of pleural effusion   . Hyperlipidemia   . Hypertension     Past Surgical History:  Procedure Laterality Date  . PATENT FORAMEN OVALE(PFO) CLOSURE N/A 10/23/2016   Procedure: Patent Forament Ovale(PFO) Closure;  Surgeon: Yates DecampGanji, Jay, MD;  [facility]: [facility];  Service: Cardiovascular;  Laterality: N/A;  . TEE WITHOUT CARDIOVERSION N/A 10/19/2016   Procedure: TRANSESOPHAGEAL ECHOCARDIOGRAM (TEE);  Surgeon: Yates DecampGanji, Jay, MD;  [facility];  Service: Cardiovascular;  Laterality: N/A;  . THORACENTESIS    . ULTRASOUND GUIDANCE FOR VASCULAR ACCESS  10/23/2016   Procedure: Ultrasound Guidance For Vascular Access;  Surgeon: Yates DecampGanji, Jay, MD;  [facility];  Service: Cardiovascular;;    Social History   Socioeconomic History  . Marital status: Legally Separated    Spouse name: Not on file  . Number of children: Not on file  . Years of education: Not on file  . Highest education level: Not on file  Social Needs  . Financial resource strain: Not on file  . Food insecurity - worry: Not on file  . Food insecurity - inability: Not on file  . Transportation needs - medical: Not on file  . Transportation needs - non-medical: Not on file  Occupational History  . Not on file  Tobacco Use  . Smoking status: Never Smoker  . Smokeless tobacco: Never Used  Substance and Sexual Activity  . Alcohol use: Yes  . Drug use: No  . Sexual activity: No  Other Topics Concern  . Not on file  Social History Narrative  . Not on file   History reviewed. No pertinent family history.    VITAL SIGNS BP 115/82   Pulse (!) 101   Ht 5\' 11"  (1.803 m)   Wt 249 lb  (112.9 kg)   BMI 34.73 kg/m   Outpatient Encounter Medications as of 12/28/2016  Medication Sig  . acetaminophen (TYLENOL) 325 MG tablet Take 650 mg by mouth every 4 (four) hours as needed for mild pain or moderate pain.  Marland Kitchen. amLODipine (NORVASC) 10 MG tablet Take 1 tablet (10 mg total) by mouth daily.  Marland Kitchen. aspirin EC 81 MG tablet Take 81 mg by mouth daily.  Marland Kitchen. atorvastatin (LIPITOR) 40 MG tablet Take 1 tablet (40 mg total) by mouth daily at 6 PM.  . clopidogrel (PLAVIX) 75 MG tablet Take 1 tablet (75 mg total) by mouth daily with breakfast.  . diclofenac sodium (VOLTAREN) 1 % GEL Apply 2 g topically 3 (three) times daily.  . diphenhydrAMINE (BENADRYL) 25 mg capsule Take 25 mg by mouth every 6 (six) hours as needed for allergies.  . hydrALAZINE (APRESOLINE) 25 MG tablet Take 1 tablet (25 mg total) by mouth every 8 (eight) hours.  . metoprolol tartrate (LOPRESSOR) 50 MG tablet Take 1 tablet (50 mg total) by mouth 2 (two) times daily.  . ondansetron (ZOFRAN) 4 MG tablet Take 4 mg by mouth every 8 (eight) hours as needed for nausea or vomiting.  . senna-docusate (SENOKOT-S) 8.6-50 MG tablet Take 1 tablet by mouth 2 (two) times daily.  Marland Kitchen. tiZANidine (ZANAFLEX) 2 MG tablet  Take 2 mg by mouth at bedtime.  . traMADol (ULTRAM) 50 MG tablet Take 50 mg by mouth 2 (two) times daily.    No facility-administered encounter medications on file as of 12/28/2016.      SIGNIFICANT DIAGNOSTIC EXAMS       ASSESSMENT/ PLAN:    MD is aware of resident's narcotic use and is in agreement with current plan of care. We will attempt to wean resident as apropriate   Synthia Innocenteborah Green NP [facility]  Contact [phone] Monday through Friday 8am- 5pm  After hours call [phone]

## 2016-12-31 ENCOUNTER — Non-Acute Institutional Stay (SKILLED_NURSING_FACILITY): Payer: Medicaid Other | Admitting: Adult Health

## 2016-12-31 ENCOUNTER — Other Ambulatory Visit: Payer: Self-pay

## 2016-12-31 ENCOUNTER — Other Ambulatory Visit: Payer: Self-pay | Admitting: Adult Health

## 2016-12-31 ENCOUNTER — Encounter: Payer: Self-pay | Admitting: Adult Health

## 2016-12-31 ENCOUNTER — Ambulatory Visit (HOSPITAL_COMMUNITY)
Admission: RE | Admit: 2016-12-31 | Discharge: 2016-12-31 | Disposition: A | Discharge status: Home / Self Care | Payer: Medicaid Other | Source: Ambulatory Visit | Attending: Adult Health | Admitting: Adult Health

## 2016-12-31 DIAGNOSIS — E785 Hyperlipidemia, unspecified: Secondary | ICD-10-CM | POA: Diagnosis not present

## 2016-12-31 DIAGNOSIS — N183 Chronic kidney disease, stage 3 unspecified: Secondary | ICD-10-CM

## 2016-12-31 DIAGNOSIS — G8111 Spastic hemiplegia affecting right dominant side: Secondary | ICD-10-CM

## 2016-12-31 DIAGNOSIS — Q2112 Patent foramen ovale: Secondary | ICD-10-CM

## 2016-12-31 DIAGNOSIS — R0602 Shortness of breath: Secondary | ICD-10-CM | POA: Diagnosis not present

## 2016-12-31 DIAGNOSIS — I6340 Cerebral infarction due to embolism of unspecified cerebral artery: Secondary | ICD-10-CM | POA: Diagnosis not present

## 2016-12-31 DIAGNOSIS — Q211 Atrial septal defect: Secondary | ICD-10-CM

## 2016-12-31 DIAGNOSIS — I10 Essential (primary) hypertension: Secondary | ICD-10-CM | POA: Diagnosis not present

## 2016-12-31 DIAGNOSIS — M25511 Pain in right shoulder: Secondary | ICD-10-CM | POA: Insufficient documentation

## 2016-12-31 DIAGNOSIS — K5909 Other constipation: Secondary | ICD-10-CM | POA: Diagnosis not present

## 2016-12-31 MED ORDER — IOPAMIDOL (ISOVUE-370) INJECTION 76%
INTRAVENOUS | Status: AC
  Administered 2016-12-31: 100 mL
  Filled 2016-12-31: qty 100

## 2016-12-31 MED ORDER — TRAMADOL HCL 50 MG PO TABS
50.0000 mg | ORAL_TABLET | Freq: Two times a day (BID) | ORAL | 0 refills | Status: DC
Start: 2016-12-31 — End: 2017-01-30

## 2016-12-31 NOTE — Telephone Encounter (Signed)
RX faxed to [facility] @ [phone], phone number [phone] 

## 2016-12-31 NOTE — Progress Notes (Signed)
This encounter was created in error - please disregard.

## 2016-12-31 NOTE — Progress Notes (Signed)
Location:   [facility] Room Number: 119 A Place of Service:  SNF (31)   CODE STATUS: Full Code  No Known Allergies  Chief Complaint  Patient presents with  . Medical Management of Chronic Issues    Constipation; pfo; chronic renal disease stage 3; cva; hypertension     HPI:  He is a 61 year old short term resident of this facility being seen for the management of his chronic illnesses: pfo; essential hypertension; cva; with right side hemiplegia; chronic constipation.  Staff reports that he is short of breath; diaphoretic; and decreased level of responsiveness.  When I arrived he is able to communicate; he is complaining of sudden shortness of breath; no chest pain; no cough. He is bradycardic in to 40-50s.  He is taking norvasc; apresoline; lopressor.  I am concerned about an PE.    Past Medical History:  Diagnosis Date  . Depression   . Hx of pleural effusion   . Hyperlipidemia   . Hypertension     Past Surgical History:  Procedure Laterality Date  . PATENT FORAMEN OVALE(PFO) CLOSURE N/A 10/23/2016   Procedure: Patent Forament [place](PFO) Closure;  Surgeon: Yates Decamp, MD;  [facility]: [facility] CV [facility];  Service: Cardiovascular;  Laterality: N/A;  . TEE WITHOUT CARDIOVERSION N/A 10/19/2016   Procedure: TRANSESOPHAGEAL ECHOCARDIOGRAM (TEE);  Surgeon: Yates Decamp, MD;  Location: [facility] ENDOSCOPY;  Service: Cardiovascular;  Laterality: N/A;  . THORACENTESIS    . ULTRASOUND GUIDANCE FOR VASCULAR ACCESS  10/23/2016   Procedure: Ultrasound Guidance For Vascular Access;  Surgeon: Yates Decamp, MD;  Location: [facility] INVASIVE CV LAB;  Service: Cardiovascular;;    Social History   Socioeconomic History  . Marital status: Legally Separated    Spouse name: Not on file  . Number of children: Not on file  . Years of education: Not on file  . Highest education level: Not on file  Social Needs  . Financial resource strain: Not on file  . Food insecurity - worry: Not on file  . Food  insecurity - inability: Not on file  . Transportation needs - medical: Not on file  . Transportation needs - non-medical: Not on file  Occupational History  . Not on file  Tobacco Use  . Smoking status: Never Smoker  . Smokeless tobacco: Never Used  Substance and Sexual Activity  . Alcohol use: Yes  . Drug use: No  . Sexual activity: No  Other Topics Concern  . Not on file  Social History Narrative  . Not on file   History reviewed. No pertinent family history.    VITAL SIGNS BP (!) 98/54   Pulse (!) 49   Temp 98.6 F (37 C)   Resp 16   Ht 5\' 11"  (1.803 m)   Wt 249 lb (112.9 kg)   SpO2 97%   BMI 34.73 kg/m    Outpatient Encounter Medications as of 12/31/2016  Medication Sig  . acetaminophen (TYLENOL) 325 MG tablet Take 650 mg by mouth every 4 (four) hours as needed for mild pain or moderate pain.  Marland Kitchen acetaminophen (TYLENOL) 325 MG tablet Take 650 mg by mouth 3 (three) times daily.  Marland Kitchen amLODipine (NORVASC) 10 MG tablet Take 1 tablet (10 mg total) by mouth daily.  Marland Kitchen aspirin EC 81 MG tablet Take 81 mg by mouth daily.  Marland Kitchen atorvastatin (LIPITOR) 40 MG tablet Take 1 tablet (40 mg total) by mouth daily at 6 PM.  . clopidogrel (PLAVIX) 75 MG tablet Take 1  tablet (75 mg total) by mouth daily with breakfast.  . diclofenac sodium (VOLTAREN) 1 % GEL Apply 2 g topically 3 (three) times daily.  . diphenhydrAMINE (BENADRYL) 25 mg capsule Take 25 mg by mouth every 6 (six) hours as needed for allergies.  . hydrALAZINE (APRESOLINE) 25 MG tablet Take 1 tablet (25 mg total) by mouth every 8 (eight) hours.  . metoprolol tartrate (LOPRESSOR) 50 MG tablet Take 1 tablet (50 mg total) by mouth 2 (two) times daily.  . ondansetron (ZOFRAN) 4 MG tablet Take 4 mg by mouth every 8 (eight) hours as needed for nausea or vomiting.  . senna-docusate (SENOKOT-S) 8.6-50 MG tablet Take 1 tablet by mouth 2 (two) times daily.  Marland Kitchen. tiZANidine (ZANAFLEX) 2 MG tablet Take 2 mg by mouth at bedtime.  . traMADol  (ULTRAM) 50 MG tablet Take 1 tablet (50 mg total) by mouth 2 (two) times daily.   No facility-administered encounter medications on file as of 12/31/2016.      SIGNIFICANT DIAGNOSTIC EXAMS  PREVIOUS:   10-17-16: ct head: 1. No acute hemorrhage or definite acute infarct. 2. Advanced chronic small vessel ischemia which could easily obscure an acute white matter or deep gray nucleus infarct. 3. Small remote appearing right frontal cortex infarct.  10-17-16: chest x-ray: No active cardiopulmonary disease.   10-17-16: MRI head; MRA head and neck:   MRI HEAD IMPRESSION:  1. Multifocal acute to early subacute ischemic infarcts involving the bilateral cerebral and left cerebellar hemispheres as above, most prominent of which is a 2 cm infarct at the posterior left lentiform nucleus/corona radiata. No associated hemorrhage or mass effect. Possible central thromboembolic etiology suspected given the various vascular distributions involved. 2. Generalized age-related cerebral atrophy with advanced chronic microvascular ischemic disease.   MRA HEAD IMPRESSION: 1. Negative intracranial MRA. No large or proximal arterial branch occlusion. No high-grade or correctable stenosis. 2. Question short-segment moderate to severe bilateral A2 and proximal left M2 stenoses, somewhat limited in evaluation due to extensive motion artifact on this exam.   MRA NECK IMPRESSION: 1. Widely patent carotid artery systems bilaterally without high-grade or critical flow limiting stenosis. 2. Short-segment moderate proximal right V1 stenosis. Vertebral arteries otherwise widely patent within the neck. 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  10-18-16: carotid doppler:  - The vertebral arteries appear patent with antegrade flow. - Findings consistent with a 1- 39 percent stenosis involving the right internal carotid artery and the left internal carotid artery.  10-18-16:  right ankle x-ray: No acute osseous abnormality.  10-19-16: TEE:  - Left ventricle: Systolic function was normal. Bove motion was normal; there were no regional [place] motion abnormalities. - Mitral valve: There is mild redundant chordae with systolic anterior motion of the chordae without LVOT obstruction. Trivial prolapse, involving the anterior leaflet. No significant myxomatous degeneration. There was trivial regurgitation. - Left atrium: No evidence of thrombus in the atrial cavity or appendage. - Right atrium: No evidence of thrombus in the atrial cavity or appendage. - Atrial septum: There was a very large patent foramen ovale. Agitated saline contrast study showed a large right-to-left atrial level shunt, in the baseline state. Agitated saline contrast study showed a large right-to-left atrial level shunt, following an increase in RA pressure induced by abdominal compression. - Impressions: A Very large PFO is probable source of the paradoxical embolic stroke.  10-20-16: bilateral lower extremity doppler: No evidence of deep vein or superficial thrombosis involving the right lower extremity and  left lower extremity.  10-20-16: myoview:  1. No reversible ischemia or infarction. 2. LEFT ventricle dilatation and global hypokinesia. 3. Left ventricular ejection fraction 43% 4. Non invasive risk stratification*: Intermediate (classification due to low ejection fraction)  10-23-16: patent foramen ovale closure   LABS REVIEWED: PREVIOUS:  10-17-16: wbc 12.3; hgb 17.8; hct 48.4; mcv 94.2; plt 257; glucose 101; bun 48; creat 1.80; k+ 3.4; na++ 142; ca 9.2; total bili 1.4; ast 59; albumin 3.7 10-18-16: wbc 9.3; hgb 15.9; hct 45.2; mcv 96.2; plt 219; glucose 97; bun 33; creat 1.56; k+ 3.5; na++ 143; ca 8.7; hgb a1c 4.7; chol 177; ldl 111; trig 232; hdl 20 8-29-569-26-18: wbc 9.3; hgb 17.0; hct 48.6; mcv 97.4; plt 157; glucose 98; bun 26; creat 1.61; k+ 4.2; na++ 136; ca 8.8   NO NEW LABS    Review of  Systems  Constitutional: Positive for malaise/fatigue.  Respiratory: Positive for shortness of breath. Negative for cough.   Cardiovascular: Negative for chest pain, palpitations and leg swelling.  Gastrointestinal: Negative for abdominal pain, constipation and heartburn.  Musculoskeletal: Negative for back pain, joint pain and myalgias.  Skin: Negative.   Neurological: Negative for dizziness.  Psychiatric/Behavioral: The patient is not nervous/anxious.     Physical Exam  Constitutional: He is oriented to person, place, and time. He appears well-developed and well-nourished. No distress.  Neck: Neck supple. No thyromegaly present.  Cardiovascular: Normal rate, regular rhythm and intact distal pulses.  Murmur heard. Pulmonary/Chest: Effort normal. No respiratory distress.  Breath sounds are diminished bilaterally   Abdominal: Soft. Bowel sounds are normal. He exhibits no distension. There is no tenderness.  Musculoskeletal: He exhibits edema.  Has right side hemiparesis Has right upper extremity edema present  Has trace lower extremity edema      Lymphadenopathy:    He has no cervical adenopathy.  Neurological: He is alert and oriented to person, place, and time.  Skin: Skin is warm. He is diaphoretic. There is pallor.  Psychiatric: He has a normal mood and affect.     ASSESSMENT/ PLAN:  TODAY:   1. Constipation: stable will continue senna s twice daily   2. PFO (patent foramen ovale) is stable is status post closure (10-23-16): EF 60-65%: will not make changes and will monitor his status.  He does not have insurance and cannot cash pay: he is unable to have a cardiology follow up.    3. Chronic renal disease stage III: without change: bun 26; creat 1.61  4. CVA with right hemiplegia: is stable will continue with therapy as directed and will follow up with neurology. Will continue asa 81 mg daily and plavix 75 mg daily  5. Hypertension: worse b/p 98/54: will make the  following changes: will reduce to norvasc 5 mg daily lopressor 25 mg twice daily and apresoline 25 mg twice daily  Will have nursing staff perform orthostatic vital signs daily for one week and report. Will hold blood pressure medication for systolic reading of <120   6. Dyslipidemia: stable ldl 111 will continue lipitor 40 mg daily  7. Spastic right hemiplegia: stable will continue zanaflex 2 mg nightly  8. Shortness of breath:  Will get ct angio of chest asap for possible PE. Will monitor his status  Time spent with patient 40 minutes: educated on differentials including PE; blood pressure medications; future testing and medications adjustments; did verbalize understanding.     MD is aware of resident's narcotic use and is in agreement with current plan of  care. We will attempt to wean resident as apropriate   Synthia Innocent NP [facility]  Contact [phone] Monday through Friday 8am- 5pm  After hours call [phone]

## 2017-01-30 ENCOUNTER — Encounter: Payer: Self-pay | Admitting: Adult Health

## 2017-01-30 ENCOUNTER — Non-Acute Institutional Stay (SKILLED_NURSING_FACILITY): Payer: Medicaid Other | Admitting: Adult Health

## 2017-01-30 DIAGNOSIS — Q211 Atrial septal defect: Secondary | ICD-10-CM

## 2017-01-30 DIAGNOSIS — N183 Chronic kidney disease, stage 3 unspecified: Secondary | ICD-10-CM

## 2017-01-30 DIAGNOSIS — Q2112 Patent foramen ovale: Secondary | ICD-10-CM

## 2017-01-30 DIAGNOSIS — K5909 Other constipation: Secondary | ICD-10-CM | POA: Diagnosis not present

## 2017-01-30 NOTE — Progress Notes (Signed)
Location:   [facility] Room Number: 231 A Place of Service:  SNF (31)   CODE STATUS: Full Code  No Known Allergies  Chief Complaint  Patient presents with  . Medical Management of Chronic Issues    ckd stage 3; pfo; chronic constipation     HPI:  h eis a 62 year old long term resident of this facility being seen for the management of his chronic illnesses: ckd stage 3; pfo; chronic constipation. He denies any chest pain; shortness of breath or cough. He denies any changes in his appetite; no changes in mood state. There are no nursing concerns at this time.   Past Medical History:  Diagnosis Date  . Depression   . Hx of pleural effusion   . Hyperlipidemia   . Hypertension     Past Surgical History:  Procedure Laterality Date  . PATENT FORAMEN OVALE(PFO) CLOSURE N/A 10/23/2016   Procedure: Patent Forament Ovale(PFO) Closure;  Surgeon: Yates DecampGanji, Jay, MD;  Location: [facility] INVASIVE CV LAB;  Service: Cardiovascular;  Laterality: N/A;  . TEE WITHOUT CARDIOVERSION N/A 10/19/2016   Procedure: TRANSESOPHAGEAL ECHOCARDIOGRAM (TEE);  Surgeon: Yates DecampGanji, Jay, MD;  [facility];  Service: Cardiovascular;  Laterality: N/A;  . THORACENTESIS    . ULTRASOUND GUIDANCE FOR VASCULAR ACCESS  10/23/2016   Procedure: Ultrasound Guidance For Vascular Access;  Surgeon: Yates DecampGanji, Jay, MD;  [facility];  Service: Cardiovascular;;    Social History   Socioeconomic History  . Marital status: Legally Separated    Spouse name: Not on file  . Number of children: Not on file  . Years of education: Not on file  . Highest education level: Not on file  Social Needs  . Financial resource strain: Not on file  . Food insecurity - worry: Not on file  . Food insecurity - inability: Not on file  . Transportation needs - medical: Not on file  . Transportation needs - non-medical: Not on file  Occupational History  . Not on file  Tobacco Use  . Smoking status: Never Smoker  .  Smokeless tobacco: Never Used  Substance and Sexual Activity  . Alcohol use: Yes  . Drug use: No  . Sexual activity: No  Other Topics Concern  . Not on file  Social History Narrative  . Not on file   History reviewed. No pertinent family history.    VITAL SIGNS BP 124/76   Pulse 68   Temp (!) 97.4 F (36.3 C)   Resp 18   Ht 5\' 11"  (1.803 m)   Wt 245 lb 1.6 oz (111.2 kg)   SpO2 97%   BMI 34.18 kg/m   Outpatient Encounter Medications as of 01/30/2017  Medication Sig  . acetaminophen (TYLENOL) 325 MG tablet Take 650 mg by mouth every 4 (four) hours as needed for mild pain or moderate pain.  Marland Kitchen. acetaminophen (TYLENOL) 325 MG tablet Take 650 mg by mouth 3 (three) times daily.  Marland Kitchen. amLODipine (NORVASC) 5 MG tablet Take 5 mg by mouth daily.  Marland Kitchen. aspirin EC 81 MG tablet Take 81 mg by mouth daily.  Marland Kitchen. atorvastatin (LIPITOR) 40 MG tablet Take 1 tablet (40 mg total) by mouth daily at 6 PM.  . clopidogrel (PLAVIX) 75 MG tablet Take 1 tablet (75 mg total) by mouth daily with breakfast.  . diclofenac sodium (VOLTAREN) 1 % GEL Apply 2 g topically 3 (three) times daily. Right shoulder and Right Foot  . diphenhydrAMINE (BENADRYL) 25 mg capsule Take  25 mg by mouth every 6 (six) hours as needed for allergies.  . hydrALAZINE (APRESOLINE) 25 MG tablet Take 25 mg by mouth 2 (two) times daily.  . metoprolol tartrate (LOPRESSOR) 50 MG tablet Take 1 tablet (50 mg total) by mouth 2 (two) times daily.  . ondansetron (ZOFRAN) 4 MG tablet Take 4 mg by mouth every 8 (eight) hours as needed for nausea or vomiting.  . senna-docusate (SENOKOT-S) 8.6-50 MG tablet Take 1 tablet by mouth 2 (two) times daily.  Marland Kitchen tiZANidine (ZANAFLEX) 4 MG tablet Take 4 mg by mouth 4 (four) times daily.   . traMADol (ULTRAM) 50 MG tablet Take 50 mg by mouth 4 (four) times daily.  . [DISCONTINUED] amLODipine (NORVASC) 10 MG tablet Take 1 tablet (10 mg total) by mouth daily.  . [DISCONTINUED] hydrALAZINE (APRESOLINE) 25 MG tablet Take 1  tablet (25 mg total) by mouth every 8 (eight) hours. (Patient not taking: Reported on 01/30/2017)  . [DISCONTINUED] traMADol (ULTRAM) 50 MG tablet Take 1 tablet (50 mg total) by mouth 2 (two) times daily. (Patient not taking: Reported on 01/30/2017)   No facility-administered encounter medications on file as of 01/30/2017.      SIGNIFICANT DIAGNOSTIC EXAMS  PREVIOUS:   10-17-16: ct head: 1. No acute hemorrhage or definite acute infarct. 2. Advanced chronic small vessel ischemia which could easily obscure an acute white matter or deep gray nucleus infarct. 3. Small remote appearing right frontal cortex infarct.  10-17-16: chest x-ray: No active cardiopulmonary disease.   10-17-16: MRI head; MRA head and neck:   MRI HEAD IMPRESSION:  1. Multifocal acute to early subacute ischemic infarcts involving the bilateral cerebral and left cerebellar hemispheres as above, most prominent of which is a 2 cm infarct at the posterior left lentiform nucleus/corona radiata. No associated hemorrhage or mass effect. Possible central thromboembolic etiology suspected given the various vascular distributions involved. 2. Generalized age-related cerebral atrophy with advanced chronic microvascular ischemic disease.   MRA HEAD IMPRESSION: 1. Negative intracranial MRA. No large or proximal arterial branch occlusion. No high-grade or correctable stenosis. 2. Question short-segment moderate to severe bilateral A2 and proximal left M2 stenoses, somewhat limited in evaluation due to extensive motion artifact on this exam.   MRA NECK IMPRESSION: 1. Widely patent carotid artery systems bilaterally without high-grade or critical flow limiting stenosis. 2. Short-segment moderate proximal right V1 stenosis. Vertebral arteries otherwise widely patent within the neck. 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  10-18-16: carotid doppler:  - The vertebral arteries appear patent with  antegrade flow. - Findings consistent with a 1- 39 percent stenosis involving the right internal carotid artery and the left internal carotid artery.  10-18-16: right ankle x-ray: No acute osseous abnormality.  10-19-16: TEE:  - Left ventricle: Systolic function was normal. Murad motion was normal; there were no regional Corne motion abnormalities. - Mitral valve: There is mild redundant chordae with systolic anterior motion of the chordae without LVOT obstruction. Trivial prolapse, involving the anterior leaflet. No significant myxomatous degeneration. There was trivial regurgitation. - Left atrium: No evidence of thrombus in the atrial cavity or appendage. - Right atrium: No evidence of thrombus in the atrial cavity or appendage. - Atrial septum: There was a very large patent foramen ovale. Agitated saline contrast study showed a large right-to-left atrial level shunt, in the baseline state. Agitated saline contrast study showed a large right-to-left atrial level shunt, following an increase in RA pressure induced by abdominal compression. -  Impressions: A Very large PFO is probable source of the paradoxical embolic stroke.  10-20-16: bilateral lower extremity doppler: No evidence of deep vein or superficial thrombosis involving the right lower extremity and left lower extremity.  10-20-16: myoview:  1. No reversible ischemia or infarction. 2. LEFT ventricle dilatation and global hypokinesia. 3. Left ventricular ejection fraction 43% 4. Non invasive risk stratification*: Intermediate (classification due to low ejection fraction)  10-23-16: patent foramen ovale closure   LABS REVIEWED: PREVIOUS:  10-17-16: wbc 12.3; hgb 17.8; hct 48.4; mcv 94.2; plt 257; glucose 101; bun 48; creat 1.80; k+ 3.4; na++ 142; ca 9.2; total bili 1.4; ast 59; albumin 3.7 10-18-16: wbc 9.3; hgb 15.9; hct 45.2; mcv 96.2; plt 219; glucose 97; bun 33; creat 1.56; k+ 3.5; na++ 143; ca 8.7; hgb a1c 4.7; chol 177; ldl 111; trig  232; hdl 20 10-24-16: wbc 9.3; hgb 17.0; hct 48.6; mcv 97.4; plt 157; glucose 98; bun 26; creat 1.61; k+ 4.2; na++ 136; ca 8.8   TODAY:   12-28-16: wbc 9.2; hgb 14.1; hct 40.8; mcv 98.9; plt 196; glucose 94; bun 20.9; create 1.64; k+ 4.9; na++ 141; ca 9.1; liver normal albumin 3.9   Review of Systems  Constitutional: Negative for malaise/fatigue.  Respiratory: Negative for cough and shortness of breath.   Cardiovascular: Negative for chest pain, palpitations and leg swelling.  Gastrointestinal: Negative for abdominal pain, constipation and heartburn.  Musculoskeletal: Negative for back pain, joint pain and myalgias.  Skin: Negative.   Neurological: Negative for dizziness.  Psychiatric/Behavioral: The patient is not nervous/anxious.     Physical Exam  Constitutional: He is oriented to person, place, and time. He appears well-developed and well-nourished. No distress.  Neck: Neck supple. No thyromegaly present.  Cardiovascular: Normal rate, regular rhythm and intact distal pulses.  Murmur heard. 1/6  Pulmonary/Chest: Effort normal and breath sounds normal. No respiratory distress.  Abdominal: Soft. Bowel sounds are normal. He exhibits no distension.  Musculoskeletal: He exhibits no edema.  Has right side hemiparesis Has right upper extremity edema present  Has trace lower extremity edema       Lymphadenopathy:    He has no cervical adenopathy.  Neurological: He is alert and oriented to person, place, and time.  Skin: Skin is warm and dry. He is not diaphoretic.  Psychiatric: He has a normal mood and affect.   ASSESSMENT/ PLAN:  TODAY:   1. Constipation: stable will continue senna s twice daily   2. PFO (patent foramen ovale) is stable is status post closure (10-23-16): EF 60-65%: will not make changes and will monitor his status.  He does not have insurance and cannot cash pay: he is unable to have a cardiology follow up.    3. Chronic renal disease stage III: without change:  bun 20.9; creat 1.64  PREVIOUS:   4. CVA with right hemiplegia: is stable . Will continue asa 81 mg daily and plavix 75 mg daily  5. Hypertension: worse b/p 124/76 is stable will continue norvasc 5 mg daily hydralazine 25 mg twice daily and lopressor 50 mg twice daily   6. Dyslipidemia: stable ldl 111 will continue lipitor 40 mg daily  7.  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       MD is aware of resident's narcotic use  and is in agreement with current plan of care. We will attempt to wean resident as apropriate     Synthia Innocent NP [facility]  Contact [phone] Monday through Friday 8am- 5pm  After hours call [phone]

## 2017-02-04 ENCOUNTER — Other Ambulatory Visit: Payer: Self-pay

## 2017-02-04 MED ORDER — TRAMADOL HCL 50 MG PO TABS: 50.0000 mg | ORAL_TABLET | Freq: Four times a day (QID) | ORAL | 0 refills | Status: DC

## 2017-02-04 NOTE — Telephone Encounter (Signed)
RX faxed to [facility] @ [phone], phone number [phone] 

## 2017-02-05 ENCOUNTER — Non-Acute Institutional Stay (SKILLED_NURSING_FACILITY): Payer: Medicaid Other | Admitting: Adult Health

## 2017-02-05 ENCOUNTER — Encounter: Payer: Self-pay | Admitting: Adult Health

## 2017-02-05 DIAGNOSIS — N183 Chronic kidney disease, stage 3 unspecified: Secondary | ICD-10-CM

## 2017-02-05 DIAGNOSIS — I6340 Cerebral infarction due to embolism of unspecified cerebral artery: Secondary | ICD-10-CM

## 2017-02-05 DIAGNOSIS — G8111 Spastic hemiplegia affecting right dominant side: Secondary | ICD-10-CM | POA: Diagnosis not present

## 2017-02-05 NOTE — Progress Notes (Signed)
Location:   [facility] Room Number: 231 A Place of Service:  SNF (31)   CODE STATUS: Full Code  No Known Allergies  Chief Complaint  Patient presents with  . Acute Visit    Care Plan Meeting    HPI:  We have come together for his care plan meeting. His go is to go home with his dad. At this time he is not ambulatory. He has 7 stairs to get in the front door; there are fewer steps in the back. He will need therapy at home and will need cna help at home. He will need dme including a wheelchair; tub transfer bench. His dad states he is willing to have [place] at home if he mobile.  We did discuss his advanced directives. We reviewed the MOST form. At this time he does not want to make any decisions regarding his advanced care needs. He wants to the think about his options before making any decisions. He did verbalize understanding of the information given to him.   Past Medical History:  Diagnosis Date  . Depression   . Hx of pleural effusion   . Hyperlipidemia   . Hypertension     Past Surgical History:  Procedure Laterality Date  . PATENT FORAMEN OVALE(PFO) CLOSURE N/A 10/23/2016   Procedure: Patent Forament Ovale(PFO) Closure;  Surgeon: Yates Decamp, MD;  Location: [facility] INVASIVE CV LAB;  Service: Cardiovascular;  Laterality: N/A;  . TEE WITHOUT CARDIOVERSION N/A 10/19/2016   Procedure: TRANSESOPHAGEAL ECHOCARDIOGRAM (TEE);  Surgeon: Yates Decamp, MD;  Location: [facility] ENDOSCOPY;  Service: Cardiovascular;  Laterality: N/A;  . THORACENTESIS    . ULTRASOUND GUIDANCE FOR VASCULAR ACCESS  10/23/2016   Procedure: Ultrasound Guidance For Vascular Access;  Surgeon: Yates Decamp, MD;  Location: [facility] INVASIVE CV LAB;  Service: Cardiovascular;;    Social History   Socioeconomic History  . Marital status: Legally Separated    Spouse name: Not on file  . Number of children: Not on file  . Years of education: Not on file  . Highest education level: Not on file  Social Needs  . Financial  resource strain: Not on file  . Food insecurity - worry: Not on file  . Food insecurity - inability: Not on file  . Transportation needs - medical: Not on file  . Transportation needs - non-medical: Not on file  Occupational History  . Not on file  Tobacco Use  . Smoking status: Never Smoker  . Smokeless tobacco: Never Used  Substance and Sexual Activity  . Alcohol use: Yes  . Drug use: No  . Sexual activity: No  Other Topics Concern  . Not on file  Social History Narrative  . Not on file   History reviewed. No pertinent family history.    VITAL SIGNS BP 130/76   Pulse 78   Temp (!) 97.4 F (36.3 C)   Resp 18   Ht 5\' 11"  (1.803 m)   Wt 245 lb 1.6 oz (111.2 kg)   SpO2 97%   BMI 34.18 kg/m   Outpatient Encounter Medications as of 02/05/2017  Medication Sig  . acetaminophen (TYLENOL) 325 MG tablet Take 650 mg by mouth 3 (three) times daily.  Marland Kitchen amLODipine (NORVASC) 5 MG tablet Take 5 mg by mouth daily.  Marland Kitchen aspirin EC 81 MG tablet Take 81 mg by mouth daily.  Marland Kitchen atorvastatin (LIPITOR) 40 MG tablet Take 1 tablet (40 mg total) by mouth daily at 6 PM.  . clopidogrel (PLAVIX) 75 MG  tablet Take 1 tablet (75 mg total) by mouth daily with breakfast.  . diclofenac sodium (VOLTAREN) 1 % GEL Apply 2 g topically 3 (three) times daily. Right shoulder and Right Foot  . diphenhydrAMINE (BENADRYL) 25 mg capsule Take 25 mg by mouth every 6 (six) hours as needed for allergies.  . hydrALAZINE (APRESOLINE) 25 MG tablet Take 25 mg by mouth 2 (two) times daily.  . metoprolol tartrate (LOPRESSOR) 50 MG tablet Take 1 tablet (50 mg total) by mouth 2 (two) times daily.  . ondansetron (ZOFRAN) 4 MG tablet Take 4 mg by mouth every 8 (eight) hours as needed for nausea or vomiting.  . senna-docusate (SENOKOT-S) 8.6-50 MG tablet Take 1 tablet by mouth 2 (two) times daily.  Marland Kitchen tiZANidine (ZANAFLEX) 4 MG tablet Take 4 mg by mouth 4 (four) times daily.   . traMADol (ULTRAM) 50 MG tablet Take 1 tablet (50 mg  total) by mouth 4 (four) times daily.  . [DISCONTINUED] acetaminophen (TYLENOL) 325 MG tablet Take 650 mg by mouth every 4 (four) hours as needed for mild pain or moderate pain.   No facility-administered encounter medications on file as of 02/05/2017.      SIGNIFICANT DIAGNOSTIC EXAMS  PREVIOUS:   10-17-16: ct head: 1. No acute hemorrhage or definite acute infarct. 2. Advanced chronic small vessel ischemia which could easily obscure an acute white matter or deep gray nucleus infarct. 3. Small remote appearing right frontal cortex infarct.  10-17-16: chest x-ray: No active cardiopulmonary disease.   10-17-16: MRI head; MRA head and neck:   MRI HEAD IMPRESSION:  1. Multifocal acute to early subacute ischemic infarcts involving the bilateral cerebral and left cerebellar hemispheres as above, most prominent of which is a 2 cm infarct at the posterior left lentiform nucleus/corona radiata. No associated hemorrhage or mass effect. Possible central thromboembolic etiology suspected given the various vascular distributions involved. 2. Generalized age-related cerebral atrophy with advanced chronic microvascular ischemic disease.   MRA HEAD IMPRESSION: 1. Negative intracranial MRA. No large or proximal arterial branch occlusion. No high-grade or correctable stenosis. 2. Question short-segment moderate to severe bilateral A2 and proximal left M2 stenoses, somewhat limited in evaluation due to extensive motion artifact on this exam.   MRA NECK IMPRESSION: 1. Widely patent carotid artery systems bilaterally without high-grade or critical flow limiting stenosis. 2. Short-segment moderate proximal right V1 stenosis. Vertebral arteries otherwise widely patent within the neck. 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  10-18-16: carotid doppler:  - The vertebral arteries appear patent with antegrade flow. - Findings consistent with a 1- 39 percent  stenosis involving the right internal carotid artery and the left internal carotid artery.  10-18-16: right ankle x-ray: No acute osseous abnormality.  10-19-16: TEE:  - Left ventricle: Systolic function was normal. Kerschner motion was normal; there were no regional Carvalho motion abnormalities. - Mitral valve: There is mild redundant chordae with systolic anterior motion of the chordae without LVOT obstruction. Trivial prolapse, involving the anterior leaflet. No significant myxomatous degeneration. There was trivial regurgitation. - Left atrium: No evidence of thrombus in the atrial cavity or appendage. - Right atrium: No evidence of thrombus in the atrial cavity or appendage. - Atrial septum: There was a very large patent foramen ovale. Agitated saline contrast study showed a large right-to-left atrial level shunt, in the baseline state. Agitated saline contrast study showed a large right-to-left atrial level shunt, following an increase in RA pressure induced by abdominal compression. -  Impressions: A Very large PFO is probable source of the paradoxical embolic stroke.  10-20-16: bilateral lower extremity doppler: No evidence of deep vein or superficial thrombosis involving the right lower extremity and left lower extremity.  10-20-16: myoview:  1. No reversible ischemia or infarction. 2. LEFT ventricle dilatation and global hypokinesia. 3. Left ventricular ejection fraction 43% 4. Non invasive risk stratification*: Intermediate (classification due to low ejection fraction)  10-23-16: patent foramen ovale closure   LABS REVIEWED: PREVIOUS:  10-17-16: wbc 12.3; hgb 17.8; hct 48.4; mcv 94.2; plt 257; glucose 101; bun 48; creat 1.80; k+ 3.4; na++ 142; ca 9.2; total bili 1.4; ast 59; albumin 3.7 10-18-16: wbc 9.3; hgb 15.9; hct 45.2; mcv 96.2; plt 219; glucose 97; bun 33; creat 1.56; k+ 3.5; na++ 143; ca 8.7; hgb a1c 4.7; chol 177; ldl 111; trig 232; hdl 20 [id number]: wbc 9.3; hgb 17.0; hct 48.6; mcv 97.4;  plt 157; glucose 98; bun 26; creat 1.61; k+ 4.2; na++ 136; ca 8.8  12-28-16: wbc 9.2; hgb 14.1; hct 40.8; mcv 98.9; plt 196; glucose 94; bun 20.9; create 1.64; k+ 4.9; na++ 141; ca 9.1; liver normal albumin 3.9  NO NEW LABS    Review of Systems  Constitutional: Negative for malaise/fatigue.  Respiratory: Negative for cough and shortness of breath.   Cardiovascular: Negative for chest pain, palpitations and leg swelling.  Gastrointestinal: Negative for abdominal pain, constipation and heartburn.  Musculoskeletal: Negative for back pain, joint pain and myalgias.  Skin: Negative.   Neurological: Negative for dizziness.  Psychiatric/Behavioral: The patient is not nervous/anxious.        Physical Exam  Constitutional: He is oriented to person, place, and time. He appears well-developed and well-nourished. No distress.  Neck: Neck supple. No thyromegaly present.  Cardiovascular: Normal rate, regular rhythm and intact distal pulses.  Murmur heard. 1/6  Pulmonary/Chest: Effort normal and breath sounds normal. No respiratory distress.  Abdominal: Soft. Bowel sounds are normal. He exhibits no distension. There is no tenderness.  Musculoskeletal: He exhibits edema.  Has right side hemiparesis Has trace lower extremity edema      Lymphadenopathy:    He has no cervical adenopathy.  Neurological: He is alert and oriented to person, place, and time.  Skin: Skin is warm and dry. He is not diaphoretic.  Psychiatric: He has a normal mood and affect.    ASSESSMENT/ PLAN:  TODAY:   1. CVA 2. Right spastic hemiplegia 3. CKD stage 3  Will not make any changes to his plan of care The goal remains for him to return home with family when he is able  Time spent with patient: 45 minutes: (25 minutes spent with advanced directives) discussed his health status medications; goals of therapy; requirements for going home and needs upon discharge. Discussed advance care plan goals; reviewing MOST form.  Will not make changes at this time; will return to this topic in the future. Verbalized understanding.   MD is aware of resident's narcotic use and is in agreement with current plan of care. We will attempt to wean resident as apropriate   Synthia Innocenteborah Green NP [facility]  Contact [phone] Monday through Friday 8am- 5pm  After hours call [phone]

## 2017-02-18 ENCOUNTER — Emergency Department (HOSPITAL_COMMUNITY): Payer: Medicaid Other

## 2017-02-18 ENCOUNTER — Encounter: Payer: Self-pay | Admitting: Adult Health

## 2017-02-18 ENCOUNTER — Encounter (HOSPITAL_COMMUNITY): Payer: Self-pay | Admitting: Emergency Medicine

## 2017-02-18 ENCOUNTER — Emergency Department (HOSPITAL_COMMUNITY)
Admission: EM | Admit: 2017-02-18 | Discharge: 2017-02-19 | Disposition: A | Discharge status: Home / Self Care | Payer: Medicaid Other | Attending: Emergency Medicine | Admitting: Emergency Medicine

## 2017-02-18 ENCOUNTER — Non-Acute Institutional Stay (SKILLED_NURSING_FACILITY): Payer: Medicaid Other | Admitting: Adult Health

## 2017-02-18 DIAGNOSIS — Z7982 Long term (current) use of aspirin: Secondary | ICD-10-CM | POA: Diagnosis not present

## 2017-02-18 DIAGNOSIS — I129 Hypertensive chronic kidney disease with stage 1 through stage 4 chronic kidney disease, or unspecified chronic kidney disease: Secondary | ICD-10-CM | POA: Insufficient documentation

## 2017-02-18 DIAGNOSIS — M79621 Pain in right upper arm: Secondary | ICD-10-CM | POA: Insufficient documentation

## 2017-02-18 DIAGNOSIS — R29898 Other symptoms and signs involving the musculoskeletal system: Secondary | ICD-10-CM | POA: Diagnosis not present

## 2017-02-18 DIAGNOSIS — Z79899 Other long term (current) drug therapy: Secondary | ICD-10-CM | POA: Insufficient documentation

## 2017-02-18 DIAGNOSIS — N183 Chronic kidney disease, stage 3 (moderate): Secondary | ICD-10-CM | POA: Diagnosis not present

## 2017-02-18 DIAGNOSIS — Z7901 Long term (current) use of anticoagulants: Secondary | ICD-10-CM | POA: Diagnosis not present

## 2017-02-18 DIAGNOSIS — M79601 Pain in right arm: Secondary | ICD-10-CM

## 2017-02-18 DIAGNOSIS — R531 Weakness: Secondary | ICD-10-CM

## 2017-02-18 MED ORDER — HYDROCODONE-ACETAMINOPHEN 5-325 MG PO TABS: 1.0000 | ORAL_TABLET | Freq: Four times a day (QID) | ORAL | 0 refills | Status: DC | PRN

## 2017-02-18 NOTE — ED Triage Notes (Addendum)
Per [facility]: [facility] c/o R arm pain x 2 days, describes as pins and needles feeling from R shoulder to his R elbow. Patient denies new injury. Had CVA 4-5 months ago with R sided deficits (weakness and decreased ROM). He denies any worsening or new weakness or decreased sensation than normal. A&O x 4. EMS VS: 110/70, P 68 regular, R 22, 98% RA, CBG 133. States nothing makes pain worse or better. It feels like his arm was asleep and is waking up. No redness or obvious swelling noted.

## 2017-02-18 NOTE — Progress Notes (Signed)
Location:   [facility] Room Number: 231 A Place of Service:  SNF (31)   CODE STATUS: Full Code  No Known Allergies  Chief Complaint  Patient presents with  . Acute Visit     Weakness    HPI:  I have been asked to see him earlier for cough and weakness. He did complain of a sore throat with a nonproductive cough. He denies fevers; shortness of breath or chest pain. Denies any changes in appetite. This afternoon is complaining of increased weakness in his upper extremities and his lower extremities. 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 He has increased rigidity present on bilateral sides. He has limited active range of motion on his right side with rigidity present. He complains of increased difficulty swallowing.   Past Medical History:  Diagnosis Date  . Depression   . Hx of pleural effusion   . Hyperlipidemia   . Hypertension     Past Surgical History:  Procedure Laterality Date  . PATENT FORAMEN OVALE(PFO) CLOSURE N/A 10/23/2016   Procedure: Patent Forament Ovale(PFO) Closure;  Surgeon: Yates DecampGanji, Jay, MD;  Location: [facility] INVASIVE CV LAB;  Service: Cardiovascular;  Laterality: N/A;  . TEE WITHOUT CARDIOVERSION N/A 10/19/2016   Procedure: TRANSESOPHAGEAL ECHOCARDIOGRAM (TEE);  Surgeon: Yates DecampGanji, Jay, MD;  [facility];  Service: Cardiovascular;  Laterality: N/A;  . THORACENTESIS    . ULTRASOUND GUIDANCE FOR VASCULAR ACCESS  10/23/2016   Procedure: Ultrasound Guidance For Vascular Access;  Surgeon: Yates DecampGanji, Jay, MD;  [facility];  Service: Cardiovascular;;    Social History   Socioeconomic History  . Marital status: Legally Separated    Spouse name: Not on file  . Number of children: Not on file  . Years of education: Not on file  . Highest education level: Not on file  Social Needs  . Financial resource strain: Not on file  . Food insecurity - worry: Not on file  . Food insecurity - inability: Not on file  . Transportation needs  - medical: Not on file  . Transportation needs - non-medical: Not on file  Occupational History  . Not on file  Tobacco Use  . Smoking status: Never Smoker  . Smokeless tobacco: Never Used  Substance and Sexual Activity  . Alcohol use: Yes  . Drug use: No  . Sexual activity: No  Other Topics Concern  . Not on file  Social History Narrative  . Not on file   History reviewed. No pertinent family history.    VITAL SIGNS BP 108/60   Pulse 70   Temp (!) 97.3 F (36.3 C)   Resp 18   Ht 5\' 11"  (1.803 m)   Wt 245 lb 1.6 oz (111.2 kg)   SpO2 97%   BMI 34.18 kg/m   Outpatient Encounter Medications as of 02/18/2017  Medication Sig  . acetaminophen (TYLENOL) 325 MG tablet Take 650 mg by mouth 3 (three) times daily.  Marland Kitchen. amLODipine (NORVASC) 5 MG tablet Take 5 mg by mouth daily.  Marland Kitchen. aspirin EC 81 MG tablet Take 81 mg by mouth daily.  Marland Kitchen. atorvastatin (LIPITOR) 40 MG tablet Take 1 tablet (40 mg total) by mouth daily at 6 PM.  . clopidogrel (PLAVIX) 75 MG tablet Take 1 tablet (75 mg total) by mouth daily with breakfast.  . diclofenac sodium (VOLTAREN) 1 % GEL Apply 2 g topically 3 (three) times daily. Right shoulder and Right Foot  . diphenhydrAMINE (BENADRYL) 25 mg capsule  Take 25 mg by mouth every 6 (six) hours as needed for allergies.  Marland Kitchen guaiFENesin (ROBITUSSIN) 100 MG/5ML liquid Give 10 ml by mouth three times daily for cough x 3 days  . hydrALAZINE (APRESOLINE) 25 MG tablet Take 25 mg by mouth 2 (two) times daily.  . metoprolol tartrate (LOPRESSOR) 50 MG tablet Take 1 tablet (50 mg total) by mouth 2 (two) times daily.  . ondansetron (ZOFRAN) 4 MG tablet Take 4 mg by mouth every 8 (eight) hours as needed for nausea or vomiting.  . senna-docusate (SENOKOT-S) 8.6-50 MG tablet Take 1 tablet by mouth 2 (two) times daily.  Marland Kitchen tiZANidine (ZANAFLEX) 4 MG tablet Take 4 mg by mouth 4 (four) times daily.   . traMADol (ULTRAM) 50 MG tablet Take 1 tablet (50 mg total) by mouth 4 (four) times daily.     No facility-administered encounter medications on file as of 02/18/2017.      SIGNIFICANT DIAGNOSTIC EXAMS  PREVIOUS:   10-17-16: ct head: 1. No acute hemorrhage or definite acute infarct. 2. Advanced chronic small vessel ischemia which could easily obscure an acute white matter or deep gray nucleus infarct. 3. Small remote appearing right frontal cortex infarct.  10-17-16: chest x-ray: No active cardiopulmonary disease.   10-17-16: MRI head; MRA head and neck:   MRI HEAD IMPRESSION:  1. Multifocal acute to early subacute ischemic infarcts involving the bilateral cerebral and left cerebellar hemispheres as above, most prominent of which is a 2 cm infarct at the posterior left lentiform nucleus/corona radiata. No associated hemorrhage or mass effect. Possible central thromboembolic etiology suspected given the various vascular distributions involved. 2. Generalized age-related cerebral atrophy with advanced chronic microvascular ischemic disease.   MRA HEAD IMPRESSION: 1. Negative intracranial MRA. No large or proximal arterial branch occlusion. No high-grade or correctable stenosis. 2. Question short-segment moderate to severe bilateral A2 and proximal left M2 stenoses, somewhat limited in evaluation due to extensive motion artifact on this exam.   MRA NECK IMPRESSION: 1. Widely patent carotid artery systems bilaterally without high-grade or critical flow limiting stenosis. 2. Short-segment moderate proximal right V1 stenosis. Vertebral arteries otherwise widely patent within the neck. 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  10-18-16: carotid doppler:  - The vertebral arteries appear patent with antegrade flow. - Findings consistent with a 1- 39 percent stenosis involving the right internal carotid artery and the left internal carotid artery.  10-18-16: right ankle x-ray: No acute osseous abnormality.  10-19-16: TEE:  - Left  ventricle: Systolic function was normal. Hemberger motion was normal; there were no regional Cowher motion abnormalities. - Mitral valve: There is mild redundant chordae with systolic anterior motion of the chordae without LVOT obstruction. Trivial prolapse, involving the anterior leaflet. No significant myxomatous degeneration. There was trivial regurgitation. - Left atrium: No evidence of thrombus in the atrial cavity or appendage. - Right atrium: No evidence of thrombus in the atrial cavity or appendage. - Atrial septum: There was a very large patent foramen ovale. Agitated saline contrast study showed a large right-to-left atrial level shunt, in the baseline state. Agitated saline contrast study showed a large right-to-left atrial level shunt, following an increase in RA pressure induced by abdominal compression. - Impressions: A Very large PFO is probable source of the paradoxical embolic stroke.  10-20-16: bilateral lower extremity doppler: No evidence of deep vein or superficial thrombosis involving the right lower extremity and left lower extremity.  10-20-16: myoview:  1. No reversible ischemia  or infarction. 2. LEFT ventricle dilatation and global hypokinesia. 3. Left ventricular ejection fraction 43% 4. Non invasive risk stratification*: Intermediate (classification due to low ejection fraction)  10-23-16: patent foramen ovale closure   LABS REVIEWED: PREVIOUS:  10-17-16: wbc 12.3; hgb 17.8; hct 48.4; mcv 94.2; plt 257; glucose 101; bun 48; creat 1.80; k+ 3.4; na++ 142; ca 9.2; total bili 1.4; ast 59; albumin 3.7 10-18-16: wbc 9.3; hgb 15.9; hct 45.2; mcv 96.2; plt 219; glucose 97; bun 33; creat 1.56; k+ 3.5; na++ 143; ca 8.7; hgb a1c 4.7; chol 177; ldl 111; trig 232; hdl 20 1-61-09: wbc 9.3; hgb 17.0; hct 48.6; mcv 97.4; plt 157; glucose 98; bun 26; creat 1.61; k+ 4.2; na++ 136; ca 8.8  12-28-16: wbc 9.2; hgb 14.1; hct 40.8; mcv 98.9; plt 196; glucose 94; bun 20.9; create 1.64; k+ 4.9; na++  141; ca 9.1; liver normal albumin 3.9  NO NEW LABS   Review of Systems  Constitutional: Negative for malaise/fatigue.  Respiratory: Negative for cough and shortness of breath.   Cardiovascular: Negative for chest pain, palpitations and leg swelling.  Gastrointestinal: Negative for abdominal pain, constipation and heartburn.  Musculoskeletal: Negative for back pain, joint pain and myalgias.  Skin: Negative.   Neurological: Negative for dizziness and headaches.       Difficulty swallowing Unable to move upper extremities Limited movement in lower extremities  Psychiatric/Behavioral: The patient is not nervous/anxious.       Physical Exam  Constitutional: He is oriented to person, place, and time. He appears well-developed and well-nourished. No distress.  Neck: Neck supple. No tracheal deviation present. No thyromegaly present.  Cardiovascular: Normal rate, regular rhythm and intact distal pulses.  Murmur heard. Pulmonary/Chest: Effort normal and breath sounds normal. No respiratory distress.  Abdominal: Soft. Bowel sounds are normal. He exhibits no distension. There is no tenderness.  Musculoskeletal: He exhibits no edema.  Right side hemiparesis Has increased rigidity on the right side Has significantly less movement in left lower extremity Has significant weakness in left upper extremity Right toes are hyperextended  Lymphadenopathy:    He has no cervical adenopathy.  Neurological: He is alert and oriented to person, place, and time.  Skin: Skin is warm and dry. He is not diaphoretic.  Psychiatric: He has a normal mood and affect.     ASSESSMENT/ PLAN:  TODAY:   1. Increased weakness and rigidity: worsening as the day progresses. Will send to the ED for further evaluation and treatment options. The worry is for [facility] barre snydrome.   Time spent with patient: 40 minutes: discussed case wit Dr. Montez Morita; educated on possibilities of symptoms; verbalized understanding.      MD is aware of resident's narcotic use and is in agreement with current plan of care. We will attempt to wean resident as apropriate     Synthia Innocent NP [facility]  Contact [phone] Monday through Friday 8am- 5pm  After hours call [phone]

## 2017-02-18 NOTE — ED Notes (Signed)
[facility] called for transport.  

## 2017-02-18 NOTE — Discharge Instructions (Signed)
Follow-up with Dr. Magnus IvanBlackman orthopedic doctor in 1 week

## 2017-02-18 NOTE — ED Provider Notes (Signed)
[facility] EMERGENCY DEPARTMENT Provider Note   CSN: [id number] Arrival date & time: 02/18/17  1946     History   Chief Complaint Chief Complaint  Patient presents with  . Arm Pain    HPI Brandon [name] is a 62 y.o. male.  Patient complains of pain in his right arm.  Patient has had a lot of physical therapy because of a stroke leaving that right arm partially paralyzed   The history is provided by the patient.  Arm Pain  This is a new problem. The current episode started more than 2 days ago. The problem occurs constantly. The problem has not changed since onset.Pertinent negatives include no chest pain, no abdominal pain and no headaches. Nothing aggravates the symptoms. Nothing relieves the symptoms. He has tried nothing for the symptoms.    Past Medical History:  Diagnosis Date  . Depression   . Hx of pleural effusion   . Hyperlipidemia   . Hypertension     Patient Active Problem List   Diagnosis Date Noted  . Rigidity 02/18/2017  . Weakness 02/18/2017  . Acute pain of right shoulder 12/31/2016  . Right spastic hemiplegia (HCC) 12/31/2016  . Shortness of breath 12/31/2016  . Chronic constipation 12/20/2016  . Hx of heart surgery 11/14/2016  . CKD (chronic kidney disease) stage 3, GFR 30-59 ml/min (HCC) 11/08/2016  . PFO (patent foramen ovale)   . CVA (cerebral vascular accident) (HCC) 10/17/2016  . Depression 05/20/2011  . ETOH abuse 05/20/2011  . Obesity 05/20/2011  . Hyperlipidemia LDL goal <70 04/22/2008  . Essential hypertension 04/22/2008    Past Surgical History:  Procedure Laterality Date  . PATENT FORAMEN OVALE(PFO) CLOSURE N/A 10/23/2016   Procedure: Patent Forament [place](PFO) Closure;  Surgeon: Yates Decamp, MD;  Location: [facility] INVASIVE CV LAB;  Service: Cardiovascular;  Laterality: N/A;  . TEE WITHOUT CARDIOVERSION N/A 10/19/2016   Procedure: TRANSESOPHAGEAL ECHOCARDIOGRAM (TEE);  Surgeon: Yates Decamp, MD;  Location: [facility] ENDOSCOPY;   Service: Cardiovascular;  Laterality: N/A;  . THORACENTESIS    . ULTRASOUND GUIDANCE FOR VASCULAR ACCESS  10/23/2016   Procedure: Ultrasound Guidance For Vascular Access;  Surgeon: Yates Decamp, MD;  Location: [facility] INVASIVE CV LAB;  Service: Cardiovascular;;       Home Medications    Prior to Admission medications   Medication Sig Start Date End Date Taking? Authorizing Provider  acetaminophen (TYLENOL) 325 MG tablet Take 650 mg by mouth 3 (three) times daily.   Yes [provider]  acetaminophen (TYLENOL) 650 MG CR tablet Take 650 mg by mouth every 8 (eight) hours as needed for pain.   Yes [provider]  amLODipine (NORVASC) 5 MG tablet Take 5 mg by mouth daily. 01/01/17  Yes [provider]  aspirin EC 81 MG tablet Take 81 mg by mouth daily.   Yes [provider]  atorvastatin (LIPITOR) 40 MG tablet Take 1 tablet (40 mg total) by mouth daily at 6 PM. 10/26/16  Yes Rai, Ripudeep K, MD  clopidogrel (PLAVIX) 75 MG tablet Take 1 tablet (75 mg total) by mouth daily with breakfast. 10/27/16  Yes Rai, Ripudeep K, MD  diclofenac sodium (VOLTAREN) 1 % GEL Apply 2 g topically 3 (three) times daily. Right shoulder and Right Foot   Yes [provider]  diphenhydrAMINE (BENADRYL) 25 mg capsule Take 25 mg by mouth every 6 (six) hours as needed for allergies.   Yes [provider]  guaiFENesin (ROBITUSSIN) 100 MG/5ML liquid Give 10  ml by mouth three times daily for cough x 3 days 02/17/17 02/20/17 Yes [provider]  hydrALAZINE (APRESOLINE) 25 MG tablet Take 25 mg by mouth 2 (two) times daily.   Yes [provider]  metoprolol tartrate (LOPRESSOR) 50 MG tablet Take 1 tablet (50 mg total) by mouth 2 (two) times daily. Patient taking differently: Take 25 mg by mouth 2 (two) times daily.  10/26/16  Yes Rai, Ripudeep K, MD  ondansetron (ZOFRAN) 4 MG tablet Take 4 mg by mouth every 8 (eight) hours as needed for nausea or vomiting. 12/27/16  Yes  [provider]  senna-docusate (SENOKOT-S) 8.6-50 MG tablet Take 1 tablet by mouth 2 (two) times daily. 10/26/16  Yes Rai, Ripudeep K, MD  senna-docusate (SENOKOT-S) 8.6-50 MG tablet Take 1 tablet by mouth at bedtime as needed for mild constipation.   Yes [provider]  tiZANidine (ZANAFLEX) 4 MG tablet Take 4 mg by mouth 4 (four) times daily.  01/17/17  Yes [provider]  traMADol (ULTRAM) 50 MG tablet Take 1 tablet (50 mg total) by mouth 4 (four) times daily. 02/04/17  Yes Sharee HolsterGreen, Deborah S, NP  traMADol (ULTRAM) 50 MG tablet Take 50 mg by mouth every 6 (six) hours as needed.   Yes [provider]  HYDROcodone-acetaminophen (NORCO/VICODIN) 5-325 MG tablet Take 1 tablet by mouth every 6 (six) hours as needed for moderate pain. 02/18/17   Bethann BerkshireZammit, Lorna Strother, MD    Family History No family history on file.  Social History Social History   Tobacco Use  . Smoking status: Never Smoker  . Smokeless tobacco: Never Used  Substance Use Topics  . Alcohol use: Yes  . Drug use: No     Allergies   Patient has no known allergies.   Review of Systems Review of Systems  Constitutional: Negative for appetite change and fatigue.  HENT: Negative for congestion, ear discharge and sinus pressure.   Eyes: Negative for discharge.  Respiratory: Negative for cough.   Cardiovascular: Negative for chest pain.  Gastrointestinal: Negative for abdominal pain and diarrhea.  Genitourinary: Negative for frequency and hematuria.  Musculoskeletal: Negative for back pain.       Pain in right arm  Skin: Negative for rash.  Neurological: Negative for seizures and headaches.  Psychiatric/Behavioral: Negative for hallucinations.     Physical Exam Updated Vital Signs BP 115/66   Pulse 69   Temp 98.5 F (36.9 C) (Oral)   Resp 16   SpO2 100%   Physical Exam  Constitutional: He is oriented to person, place, and time. He appears well-developed.  HENT:  Head:  Normocephalic.  Eyes: Conjunctivae are normal.  Neck: No tracheal deviation present.  Cardiovascular:  No murmur heard. Musculoskeletal: Normal range of motion.  Tenderness to right humerus with decreased range of motion radial pulse 2+.  Neurological: He is oriented to person, place, and time.  Skin: Skin is warm.  Psychiatric: He has a normal mood and affect.     ED Treatments / Results  Labs (all labs ordered are listed, but only abnormal results are displayed) Labs Reviewed - No data to display  EKG  EKG Interpretation None       Radiology Dg Humerus Right  Result Date: 02/18/2017 CLINICAL DATA:  Right arm pain for 2 days EXAM: RIGHT HUMERUS - 2+ VIEW COMPARISON:  None. FINDINGS: No acute displaced fracture or malalignment is seen. Soft tissues are unremarkable. Mild AC joint degenerative change. IMPRESSION: No acute osseous abnormality. Electronically Signed  By: Jasmine Pang M.D.   On: 02/18/2017 21:19    Procedures Procedures (including critical care time)  Medications Ordered in ED Medications - No data to display   Initial Impression / Assessment and Plan / ED Course  I have reviewed the triage vital signs and the nursing notes.  Pertinent labs & imaging results that were available during my care of the patient were reviewed by me and considered in my medical decision making (see chart for details).    Patient with musculoskeletal discomfort in right arm.  He will be given some Vicodin and will follow up with orthopedic  Final Clinical Impressions(s) / ED Diagnoses   Final diagnoses:  Right arm pain    ED Discharge Orders        Ordered    HYDROcodone-acetaminophen (NORCO/VICODIN) 5-325 MG tablet  Every 6 hours PRN     02/18/17 2245       Bethann Berkshire, MD 02/18/17 2248

## 2017-02-19 ENCOUNTER — Non-Acute Institutional Stay (SKILLED_NURSING_FACILITY): Payer: Medicaid Other | Admitting: Adult Health

## 2017-02-19 ENCOUNTER — Encounter: Payer: Self-pay | Admitting: Adult Health

## 2017-02-19 DIAGNOSIS — M79601 Pain in right arm: Secondary | ICD-10-CM

## 2017-02-19 NOTE — ED Notes (Signed)
Patient unable to e-sign; report given to nursing home.

## 2017-02-19 NOTE — Progress Notes (Signed)
Location:   [facility] Room Number: 231 A Place of Service:  SNF (31)   CODE STATUS: Full Code  No Known Allergies  Chief Complaint  Patient presents with  . Acute Visit    ER Follow up from 02/18/17    HPI:  He requested to go to the ED last night due to right arm pain. The x-ray done did not show any acute process. Which is more than likely musculoskeletal in nature. He tells me today that his arm is starting to feel better. He did tell me he is working with therapy on a daily basis. He denies any chest pain; shortness of breath or dizziness.   Past Medical History:  Diagnosis Date  . Depression   . Hx of pleural effusion   . Hyperlipidemia   . Hypertension     Past Surgical History:  Procedure Laterality Date  . PATENT FORAMEN OVALE(PFO) CLOSURE N/A 10/23/2016   Procedure: Patent Forament Ovale(PFO) Closure;  Surgeon: Yates Decamp, MD;  Location: [facility] INVASIVE CV LAB;  Service: Cardiovascular;  Laterality: N/A;  . TEE WITHOUT CARDIOVERSION N/A 10/19/2016   Procedure: TRANSESOPHAGEAL ECHOCARDIOGRAM (TEE);  Surgeon: Yates Decamp, MD;  Location: [facility] ENDOSCOPY;  Service: Cardiovascular;  Laterality: N/A;  . THORACENTESIS    . ULTRASOUND GUIDANCE FOR VASCULAR ACCESS  10/23/2016   Procedure: Ultrasound Guidance For Vascular Access;  Surgeon: Yates Decamp, MD;  Location: [facility] INVASIVE CV LAB;  Service: Cardiovascular;;    Social History   Socioeconomic History  . Marital status: Legally Separated    Spouse name: Not on file  . Number of children: Not on file  . Years of education: Not on file  . Highest education level: Not on file  Social Needs  . Financial resource strain: Not on file  . Food insecurity - worry: Not on file  . Food insecurity - inability: Not on file  . Transportation needs - medical: Not on file  . Transportation needs - non-medical: Not on file  Occupational History  . Not on file  Tobacco Use  . Smoking status: Never Smoker  . Smokeless  tobacco: Never Used  Substance and Sexual Activity  . Alcohol use: Yes  . Drug use: No  . Sexual activity: No  Other Topics Concern  . Not on file  Social History Narrative  . Not on file   History reviewed. No pertinent family history.    VITAL SIGNS BP 132/70   Pulse 77   Temp 98.4 F (36.9 C)   Resp 20   Ht 5\' 11"  (1.803 m)   Wt 245 lb 1.6 oz (111.2 kg)   SpO2 98%   BMI 34.18 kg/m   Outpatient Encounter Medications as of 02/19/2017  Medication Sig  . acetaminophen (TYLENOL) 325 MG tablet Take 650 mg by mouth 3 (three) times daily.  Marland Kitchen acetaminophen (TYLENOL) 650 MG CR tablet Take 650 mg by mouth 3 (three) times daily.   Marland Kitchen amLODipine (NORVASC) 5 MG tablet Take 5 mg by mouth daily.  Marland Kitchen aspirin EC 81 MG tablet Take 81 mg by mouth daily.  Marland Kitchen atorvastatin (LIPITOR) 40 MG tablet Take 1 tablet (40 mg total) by mouth daily at 6 PM.  . clopidogrel (PLAVIX) 75 MG tablet Take 1 tablet (75 mg total) by mouth daily with breakfast.  . diclofenac sodium (VOLTAREN) 1 % GEL Apply 2 g topically 3 (three) times daily. Right shoulder and Right Foot  . diphenhydrAMINE (BENADRYL) 25 mg capsule Take 25 mg by  mouth every 6 (six) hours as needed for allergies.  Marland Kitchen. guaiFENesin (ROBITUSSIN) 100 MG/5ML liquid Give 10 ml by mouth three times daily for cough x 3 days  . hydrALAZINE (APRESOLINE) 25 MG tablet Take 25 mg by mouth 2 (two) times daily.  Marland Kitchen. HYDROcodone-acetaminophen (NORCO/VICODIN) 5-325 MG tablet Take 1 tablet by mouth every 6 (six) hours as needed for moderate pain.  . metoprolol succinate (TOPROL-XL) 25 MG 24 hr tablet Take 25 mg by mouth 2 (two) times daily.  . ondansetron (ZOFRAN) 4 MG tablet Take 4 mg by mouth every 8 (eight) hours as needed for nausea or vomiting.  . senna-docusate (SENOKOT-S) 8.6-50 MG tablet Take 1 tablet by mouth 2 (two) times daily as needed for mild constipation.   Marland Kitchen. tiZANidine (ZANAFLEX) 4 MG tablet Take 4 mg by mouth 4 (four) times daily.   . traMADol (ULTRAM) 50  MG tablet Take 1 tablet (50 mg total) by mouth 4 (four) times daily.  . [DISCONTINUED] metoprolol tartrate (LOPRESSOR) 50 MG tablet Take 1 tablet (50 mg total) by mouth 2 (two) times daily. (Patient not taking: Reported on 02/19/2017)  . [DISCONTINUED] senna-docusate (SENOKOT-S) 8.6-50 MG tablet Take 1 tablet by mouth 2 (two) times daily. (Patient not taking: Reported on 02/19/2017)  . [DISCONTINUED] traMADol (ULTRAM) 50 MG tablet Take 50 mg by mouth every 6 (six) hours as needed.   No facility-administered encounter medications on file as of 02/19/2017.      SIGNIFICANT DIAGNOSTIC EXAMS  PREVIOUS:   10-17-16: ct head: 1. No acute hemorrhage or definite acute infarct. 2. Advanced chronic small vessel ischemia which could easily obscure an acute white matter or deep gray nucleus infarct. 3. Small remote appearing right frontal cortex infarct.  10-17-16: chest x-ray: No active cardiopulmonary disease.   10-17-16: MRI head; MRA head and neck:   MRI HEAD IMPRESSION:  1. Multifocal acute to early subacute ischemic infarcts involving the bilateral cerebral and left cerebellar hemispheres as above, most prominent of which is a 2 cm infarct at the posterior left lentiform nucleus/corona radiata. No associated hemorrhage or mass effect. Possible central thromboembolic etiology suspected given the various vascular distributions involved. 2. Generalized age-related cerebral atrophy with advanced chronic microvascular ischemic disease.   MRA HEAD IMPRESSION: 1. Negative intracranial MRA. No large or proximal arterial branch occlusion. No high-grade or correctable stenosis. 2. Question short-segment moderate to severe bilateral A2 and proximal left M2 stenoses, somewhat limited in evaluation due to extensive motion artifact on this exam.   MRA NECK IMPRESSION: 1. Widely patent carotid artery systems bilaterally without high-grade or critical flow limiting stenosis. 2. Short-segment moderate proximal  right V1 stenosis. Vertebral arteries otherwise widely patent within the neck. 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  10-18-16: carotid doppler:  - The vertebral arteries appear patent with antegrade flow. - Findings consistent with a 1- 39 percent stenosis involving the right internal carotid artery and the left internal carotid artery.  10-18-16: right ankle x-ray: No acute osseous abnormality.  10-19-16: TEE:  - Left ventricle: Systolic function was normal. Werber motion was normal; there were no regional Carlton motion abnormalities. - Mitral valve: There is mild redundant chordae with systolic anterior motion of the chordae without LVOT obstruction. Trivial prolapse, involving the anterior leaflet. No significant myxomatous degeneration. There was trivial regurgitation. - Left atrium: No evidence of thrombus in the atrial cavity or appendage. - Right atrium: No evidence of thrombus in the atrial cavity or appendage. - Atrial  septum: There was a very large patent foramen ovale. Agitated saline contrast study showed a large right-to-left atrial level shunt, in the baseline state. Agitated saline contrast study showed a large right-to-left atrial level shunt, following an increase in RA pressure induced by abdominal compression. - Impressions: A Very large PFO is probable source of the paradoxical embolic stroke.  10-20-16: bilateral lower extremity doppler: No evidence of deep vein or superficial thrombosis involving the right lower extremity and left lower extremity.  10-20-16: myoview:  1. No reversible ischemia or infarction. 2. LEFT ventricle dilatation and global hypokinesia. 3. Left ventricular ejection fraction 43% 4. Non invasive risk stratification*: Intermediate (classification due to low ejection fraction)  10-23-16: patent foramen ovale closure   TODAY:   02-18-17: right humerus x-ray: No acute osseous abnormality.    LABS REVIEWED:  PREVIOUS:  10-17-16: wbc 12.3; hgb 17.8; hct 48.4; mcv 94.2; plt 257; glucose 101; bun 48; creat 1.80; k+ 3.4; na++ 142; ca 9.2; total bili 1.4; ast 59; albumin 3.7 10-18-16: wbc 9.3; hgb 15.9; hct 45.2; mcv 96.2; plt 219; glucose 97; bun 33; creat 1.56; k+ 3.5; na++ 143; ca 8.7; hgb a1c 4.7; chol 177; ldl 111; trig 232; hdl 20 1-61-09: wbc 9.3; hgb 17.0; hct 48.6; mcv 97.4; plt 157; glucose 98; bun 26; creat 1.61; k+ 4.2; na++ 136; ca 8.8  12-28-16: wbc 9.2; hgb 14.1; hct 40.8; mcv 98.9; plt 196; glucose 94; bun 20.9; create 1.64; k+ 4.9; na++ 141; ca 9.1; liver normal albumin 3.9  NO NEW LABS   Review of Systems  Constitutional: Negative for malaise/fatigue.  Respiratory: Negative for cough and shortness of breath.   Cardiovascular: Negative for chest pain, palpitations and leg swelling.  Gastrointestinal: Negative for abdominal pain, constipation and heartburn.  Musculoskeletal: Negative for back pain, joint pain and myalgias.  Skin: Negative.   Neurological: Negative for dizziness.  Psychiatric/Behavioral: The patient is not nervous/anxious.     Physical Exam  Constitutional: He is oriented to person, place, and time. He appears well-developed and well-nourished. No distress.  Neck: No thyromegaly present.  Cardiovascular: Normal rate, regular rhythm and intact distal pulses.  Murmur heard. 1/6  Pulmonary/Chest: Effort normal and breath sounds normal. No respiratory distress.  Abdominal: Soft. Bowel sounds are normal. He exhibits no distension. There is no tenderness.  Musculoskeletal: He exhibits no edema.  Right side hemiparesis Has increased rigidity on the right side Has significantly less movement in left lower extremity Has significant weakness in left upper extremity  Lymphadenopathy:    He has no cervical adenopathy.  Neurological: He is alert and oriented to person, place, and time.  Skin: Skin is warm and dry. He is not diaphoretic.  Psychiatric: He has a normal mood  and affect.     ASSESSMENT/ PLAN:  TODAY:   1. Right arm pain: will continue therapy as directed; will continue to monitor his status; will not make changes   MD is aware of resident's narcotic use and is in agreement with current plan of care. We will attempt to wean resident as apropriate     Synthia Innocent NP [facility]  Contact [phone] Monday through Friday 8am- 5pm  After hours call [phone]

## 2017-02-19 NOTE — ED Notes (Signed)
Attempted to call report @ 00:14 - no answer,

## 2017-03-14 ENCOUNTER — Non-Acute Institutional Stay (SKILLED_NURSING_FACILITY): Payer: Medicaid Other | Admitting: Internal Medicine

## 2017-03-14 ENCOUNTER — Encounter: Payer: Self-pay | Admitting: Internal Medicine

## 2017-03-14 DIAGNOSIS — K5909 Other constipation: Secondary | ICD-10-CM | POA: Diagnosis not present

## 2017-03-14 DIAGNOSIS — Z8673 Personal history of transient ischemic attack (TIA), and cerebral infarction without residual deficits: Secondary | ICD-10-CM

## 2017-03-14 DIAGNOSIS — G8111 Spastic hemiplegia affecting right dominant side: Secondary | ICD-10-CM

## 2017-03-14 DIAGNOSIS — I10 Essential (primary) hypertension: Secondary | ICD-10-CM

## 2017-03-14 DIAGNOSIS — N183 Chronic kidney disease, stage 3 unspecified: Secondary | ICD-10-CM

## 2017-03-14 NOTE — Progress Notes (Signed)
Location:  [facility] Room Number: 221A Place of Service:  SNF (31) Provider:  DR Elmon Kirschner  Patient, No Pcp Per  Patient Care Team: Patient, No Pcp Per as PCP - General (General Practice)  Extended Emergency Contact Information Primary Emergency Contact: Fisch,Sixto T Address: [address]          [place] [address] [place] Home Phone: [phone] Relation: Father  Code Status:  Full Goals of care: Advanced Directive information Advanced Directives 02/19/2017  Does Patient Have a Medical Advance Directive? No  Would patient like information on creating a medical advance directive? No - Patient declined     Chief Complaint  Patient presents with  . Medical Management of Chronic Issues    Medical Management of Chronic Issues    HPI:  Pt is a 62 y.o. male seen today for medical management of chronic diseases.   He states he needs labs from specialist; he needs social security. He c/o RUE "no use". He fell last week. He has occasional constipation and intermittent right abdominal pain. His brother is at bedside. Cr 1.64  Constipation - takes senna s twice daily   PFO (patent foramen ovale) - stable s/p closure (10-23-16); EF 60-65%.  He does not have insurance and cannot cash pay. he is unable to have a cardiology follow up.    CKD - stage 3. Cr 1.64  Hx CVA with right hemiplegia - stable. Takes ASA 81 mg daily; plavix 75 mg daily; tylenol 650 mg three times daily; zanaflex 4 mg four times daily; ultram 50 mg four times daily; voltaren gel 1 % 2 gm three times daily to right shoulder and right foot.   Hypertension - stable on norvasc 5 mg daily; hydralazine 25 mg twice daily; lopressor 50 mg twice daily   Dyslipidemia - stable on lipitor 40 mg daily. LDL 111   Past Medical History:  Diagnosis Date  . Depression   . Hx of pleural effusion   . Hyperlipidemia   . Hypertension    Past Surgical History:  Procedure  Laterality Date  . PATENT FORAMEN OVALE(PFO) CLOSURE N/A 10/23/2016   Procedure: Patent Forament Ovale(PFO) Closure;  Surgeon: Yates Decamp, MD;  Location: [facility] INVASIVE CV LAB;  Service: Cardiovascular;  Laterality: N/A;  . TEE WITHOUT CARDIOVERSION N/A 10/19/2016   Procedure: TRANSESOPHAGEAL ECHOCARDIOGRAM (TEE);  Surgeon: Yates Decamp, MD;  Location: [facility] ENDOSCOPY;  Service: Cardiovascular;  Laterality: N/A;  . THORACENTESIS    . ULTRASOUND GUIDANCE FOR VASCULAR ACCESS  10/23/2016   Procedure: Ultrasound Guidance For Vascular Access;  Surgeon: Yates Decamp, MD;  Location: [facility] INVASIVE CV LAB;  Service: Cardiovascular;;    No Known Allergies  Outpatient Encounter Medications as of 03/14/2017  Medication Sig  . acetaminophen (TYLENOL) 650 MG CR tablet Take 650 mg by mouth 3 (three) times daily.   Marland Kitchen amLODipine (NORVASC) 5 MG tablet Take 5 mg by mouth daily.  Marland Kitchen aspirin EC 81 MG tablet Take 81 mg by mouth daily.  Marland Kitchen atorvastatin (LIPITOR) 40 MG tablet Take 1 tablet (40 mg total) by mouth daily at 6 PM.  . clopidogrel (PLAVIX) 75 MG tablet Take 1 tablet (75 mg total) by mouth daily with breakfast.  . diclofenac sodium (VOLTAREN) 1 % GEL Apply 2 g topically 3 (three) times daily. Right shoulder and Right Foot  . diphenhydrAMINE (BENADRYL) 25 mg capsule Take 25 mg by mouth every 6 (six) hours as needed for allergies.  Marland Kitchen  hydrALAZINE (APRESOLINE) 25 MG tablet Take 25 mg by mouth 2 (two) times daily.  Marland Kitchen. HYDROcodone-acetaminophen (NORCO/VICODIN) 5-325 MG tablet Take 1 tablet by mouth every 6 (six) hours as needed for moderate pain.  . metoprolol succinate (TOPROL-XL) 25 MG 24 hr tablet Take 25 mg by mouth 2 (two) times daily.  . ondansetron (ZOFRAN) 4 MG tablet Take 4 mg by mouth every 8 (eight) hours as needed for nausea or vomiting.  . senna-docusate (SENOKOT-S) 8.6-50 MG tablet Take 1 tablet by mouth 2 (two) times daily as needed for mild constipation.   Marland Kitchen. tiZANidine (ZANAFLEX) 4 MG tablet Take 4 mg by mouth 4  (four) times daily.   . traMADol (ULTRAM) 50 MG tablet Take 1 tablet (50 mg total) by mouth 4 (four) times daily.  . [DISCONTINUED] acetaminophen (TYLENOL) 325 MG tablet Take 650 mg by mouth 3 (three) times daily.   No facility-administered encounter medications on file as of 03/14/2017.     Review of Systems  Neurological: Positive for weakness and numbness.  All other systems reviewed and are negative.   Immunization History  Administered Date(s) Administered  . Influenza-Unspecified 11/21/2016  . PPD Test 11/04/2016  . Tdap 01/11/2010   Pertinent  Health Maintenance Due  Topic Date Due  . COLONOSCOPY  10/29/2017 (Originally 02/26/2006)  . INFLUENZA VACCINE  Completed   No flowsheet data found. Functional Status Survey:    Vitals:   03/14/17 1415  BP: 122/76  Pulse: 72  Resp: 20  Temp: (!) 97.5 F (36.4 C)  TempSrc: Oral  SpO2: 98%  Weight: 227 lb 14.4 oz (103.4 kg)  Height: 5\' 11"  (1.803 m)   Body mass index is 31.79 kg/m. Physical Exam  Constitutional: He is oriented to person, place, and time. He appears well-developed and well-nourished.  Sitting up in bed in NAD  HENT:  Mouth/Throat: Oropharynx is clear and moist.  MMM; no oral thrush  Eyes: Pupils are equal, round, and reactive to light. No scleral icterus.  Neck: Neck supple. Carotid bruit is not present.  Cardiovascular: Normal rate, regular rhythm and intact distal pulses. Exam reveals no gallop and no friction rub.  Murmur (1/6 SEM) heard. No distal LE edema. No calf TTP  Pulmonary/Chest: Effort normal and breath sounds normal. He has no wheezes. He has no rales. He exhibits no tenderness.  Abdominal: Soft. Normal appearance and bowel sounds are normal. He exhibits no distension, no abdominal bruit, no pulsatile midline mass and no mass. There is no hepatomegaly. There is no tenderness. There is no rigidity, no rebound and no guarding. No hernia.  obese  Musculoskeletal: He exhibits edema and  deformity.  RUE contracture  Lymphadenopathy:    He has no cervical adenopathy.  Neurological: He is alert and oriented to person, place, and time.  Right hemiparesis  Skin: Skin is warm and dry. No rash noted.  Psychiatric: He has a normal mood and affect. His behavior is normal. Judgment and thought content normal.    Labs reviewed: Recent Labs    10/23/16 0533 10/24/16 0301 10/25/16 0215 12/28/16  NA 137 136 134* 141  K 4.0 4.2 4.1 4.9  CL 106 105 103  --   CO2 22 21* 26  --   GLUCOSE 94 98 96  --   BUN 25* 26* 24* 21  CREATININE 1.58* 1.61* 1.65* 1.6*  CALCIUM 8.8* 8.8* 8.7*  --    Recent Labs    10/17/16 0850 12/28/16  AST 59* 21  ALT 48  21  ALKPHOS 72 67  BILITOT 1.4*  --   PROT 8.0  --   ALBUMIN 3.7  --    Recent Labs    10/17/16 0850  10/21/16 0523 10/23/16 0533 10/24/16 0301 12/28/16  WBC 12.3*   < > 9.4 9.1 9.3 9.2  NEUTROABS 8.1*  --   --   --   --  6  HGB 17.8*   < > 16.6 16.1 17.0 14.1  HCT 48.4   < > 48.4 46.2 48.6 41  MCV 94.2   < > 98.2 97.1 97.4  --   PLT 257   < > 199 195 157 196   < > = values in this interval not displayed.   No results found for: TSH Lab Results  Component Value Date   HGBA1C 4.7 (L) 10/18/2016   Lab Results  Component Value Date   CHOL 177 10/18/2016   HDL 20 (L) 10/18/2016   LDLCALC 111 (H) 10/18/2016   TRIG 232 (H) 10/18/2016   CHOLHDL 8.9 10/18/2016    Significant Diagnostic Results in last 30 days:  Dg Humerus Right  Result Date: 02/18/2017 CLINICAL DATA:  Right arm pain for 2 days EXAM: RIGHT HUMERUS - 2+ VIEW COMPARISON:  None. FINDINGS: No acute displaced fracture or malalignment is seen. Soft tissues are unremarkable. Mild AC joint degenerative change. IMPRESSION: No acute osseous abnormality. Electronically Signed   By: Jasmine Pang M.D.   On: 02/18/2017 21:19    Assessment/Plan   ICD-10-CM   1. 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 History of stroke Z86.73   3. Chronic constipation K59.09     4. Essential hypertension I10   5. CKD (chronic kidney disease) stage 3, GFR 30-59 ml/min (HCC) N18.3      He has seen the specialist and labs ordered  [facility] for supplements  Cont current meds as ordered  PT/OT/ST as indicated  Will follow  Labs/tests ordered: cmp, lipid panel   Mele Sylvester S. Ancil Linsey  [facility] [address] [place], [place] [address] [phone] Cell (Monday-Friday 8 AM - 5 PM) [phone] After 5 PM and follow prompts

## 2017-04-09 ENCOUNTER — Non-Acute Institutional Stay (SKILLED_NURSING_FACILITY): Payer: Medicaid Other | Admitting: Adult Health

## 2017-04-09 ENCOUNTER — Encounter: Payer: Self-pay | Admitting: Adult Health

## 2017-04-09 DIAGNOSIS — E785 Hyperlipidemia, unspecified: Secondary | ICD-10-CM | POA: Diagnosis not present

## 2017-04-09 DIAGNOSIS — I6340 Cerebral infarction due to embolism of unspecified cerebral artery: Secondary | ICD-10-CM

## 2017-04-09 DIAGNOSIS — G8111 Spastic hemiplegia affecting right dominant side: Secondary | ICD-10-CM

## 2017-04-09 DIAGNOSIS — I10 Essential (primary) hypertension: Secondary | ICD-10-CM

## 2017-04-09 NOTE — Progress Notes (Signed)
Location:   [facility] Room Number: 221 A Place of Service:  SNF (31)   CODE STATUS:  Full Code  No Known Allergies  Chief Complaint  Patient presents with  . Medical Management of Chronic Issues    Hypertension; cva; hyperlipidemia     HPI:  He is a 62 year old long term resident of this facility being seen for the management of his chronic illnesses; hypertension; cva; dyslipidemia. He denies any chest pain; shortness of breath; no changes in appetite. He is due to have his right shoulder injected. There are no nursing concerns at this time.   Past Medical History:  Diagnosis Date  . Depression   . Hx of pleural effusion   . Hyperlipidemia   . Hypertension     Past Surgical History:  Procedure Laterality Date  . PATENT FORAMEN OVALE(PFO) CLOSURE N/A 10/23/2016   Procedure: Patent Forament Ovale(PFO) Closure;  Surgeon: Yates Decamp, MD;  Location: [facility] INVASIVE CV LAB;  Service: Cardiovascular;  Laterality: N/A;  . TEE WITHOUT CARDIOVERSION N/A 10/19/2016   Procedure: TRANSESOPHAGEAL ECHOCARDIOGRAM (TEE);  Surgeon: Yates Decamp, MD;  Location: [facility] ENDOSCOPY;  Service: Cardiovascular;  Laterality: N/A;  . THORACENTESIS    . ULTRASOUND GUIDANCE FOR VASCULAR ACCESS  10/23/2016   Procedure: Ultrasound Guidance For Vascular Access;  Surgeon: Yates Decamp, MD;  Location: [facility] INVASIVE CV LAB;  Service: Cardiovascular;;    Social History   Socioeconomic History  . Marital status: Legally Separated    Spouse name: Not on file  . Number of children: Not on file  . Years of education: Not on file  . Highest education level: Not on file  Social Needs  . Financial resource strain: Not on file  . Food insecurity - worry: Not on file  . Food insecurity - inability: Not on file  . Transportation needs - medical: Not on file  . Transportation needs - non-medical: Not on file  Occupational History  . Not on file  Tobacco Use  . Smoking status: Never Smoker  . Smokeless  tobacco: Never Used  Substance and Sexual Activity  . Alcohol use: Yes  . Drug use: No  . Sexual activity: No  Other Topics Concern  . Not on file  Social History Narrative  . Not on file   History reviewed. No pertinent family history.    VITAL SIGNS BP 133/90   Pulse 78   Temp 97.8 F (36.6 C)   Resp 16   Ht 5\' 11"  (1.803 m)   Wt 221 lb (100.2 kg)   SpO2 97%   BMI 30.82 kg/m   Outpatient Encounter Medications as of 04/09/2017  Medication Sig  . acetaminophen (TYLENOL) 650 MG CR tablet Take 650 mg by mouth 3 (three) times daily.   Marland Kitchen amLODipine (NORVASC) 5 MG tablet Take 5 mg by mouth daily.  Marland Kitchen aspirin EC 81 MG tablet Take 81 mg by mouth daily.  Marland Kitchen atorvastatin (LIPITOR) 40 MG tablet Take 1 tablet (40 mg total) by mouth daily at 6 PM.  . clopidogrel (PLAVIX) 75 MG tablet Take 1 tablet (75 mg total) by mouth daily with breakfast.  . diphenhydrAMINE (BENADRYL) 25 mg capsule Take 25 mg by mouth every 6 (six) hours as needed for allergies.  . hydrALAZINE (APRESOLINE) 25 MG tablet Take 25 mg by mouth 2 (two) times daily.  Marland Kitchen HYDROcodone-acetaminophen (NORCO/VICODIN) 5-325 MG tablet Take 1 tablet by mouth every 6 (six) hours as needed for moderate pain.  Marland Kitchen  Lidocaine 4 % PTCH Apply to right shoulder every 12 hours  . metoprolol succinate (TOPROL-XL) 25 MG 24 hr tablet Take 25 mg by mouth 2 (two) times daily.  . ondansetron (ZOFRAN) 4 MG tablet Take 4 mg by mouth every 8 (eight) hours as needed for nausea or vomiting.  . senna-docusate (SENOKOT-S) 8.6-50 MG tablet Take 1 tablet by mouth 2 (two) times daily as needed for mild constipation.   Marland Kitchen tiZANidine (ZANAFLEX) 4 MG tablet Take 4 mg by mouth 4 (four) times daily.   . traMADol (ULTRAM) 50 MG tablet Take by mouth every 8 (eight) hours. Hold if confused or sleepy  . traMADol (ULTRAM) 50 MG tablet Take 50 mg by mouth every 6 (six) hours as needed.  . [DISCONTINUED] diclofenac sodium (VOLTAREN) 1 % GEL Apply 2 g topically 3 (three)  times daily. Right shoulder and Right Foot  . [DISCONTINUED] traMADol (ULTRAM) 50 MG tablet Take 1 tablet (50 mg total) by mouth 4 (four) times daily. (Patient not taking: Reported on 04/09/2017)   No facility-administered encounter medications on file as of 04/09/2017.      SIGNIFICANT DIAGNOSTIC EXAMS  PREVIOUS:   10-17-16: ct head: 1. No acute hemorrhage or definite acute infarct. 2. Advanced chronic small vessel ischemia which could easily obscure an acute white matter or deep gray nucleus infarct. 3. Small remote appearing right frontal cortex infarct.  10-17-16: chest x-ray: No active cardiopulmonary disease.   10-17-16: MRI head; MRA head and neck:   MRI HEAD IMPRESSION:  1. Multifocal acute to early subacute ischemic infarcts involving the bilateral cerebral and left cerebellar hemispheres as above, most prominent of which is a 2 cm infarct at the posterior left lentiform nucleus/corona radiata. No associated hemorrhage or mass effect. Possible central thromboembolic etiology suspected given the various vascular distributions involved. 2. Generalized age-related cerebral atrophy with advanced chronic microvascular ischemic disease.   MRA HEAD IMPRESSION: 1. Negative intracranial MRA. No large or proximal arterial branch occlusion. No high-grade or correctable stenosis. 2. Question short-segment moderate to severe bilateral A2 and proximal left M2 stenoses, somewhat limited in evaluation due to extensive motion artifact on this exam.   MRA NECK IMPRESSION: 1. Widely patent carotid artery systems bilaterally without high-grade or critical flow limiting stenosis. 2. Short-segment moderate proximal right V1 stenosis. Vertebral arteries otherwise widely patent within the neck. 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  10-18-16: carotid doppler:  - The vertebral arteries appear patent with antegrade flow. - Findings consistent with a 1-  39 percent stenosis involving the right internal carotid artery and the left internal carotid artery.  10-18-16: right ankle x-ray: No acute osseous abnormality.  10-19-16: TEE:  - Left ventricle: Systolic function was normal. Searls motion was normal; there were no regional Herbig motion abnormalities. - Mitral valve: There is mild redundant chordae with systolic anterior motion of the chordae without LVOT obstruction. Trivial prolapse, involving the anterior leaflet. No significant myxomatous degeneration. There was trivial regurgitation. - Left atrium: No evidence of thrombus in the atrial cavity or appendage. - Right atrium: No evidence of thrombus in the atrial cavity or appendage. - Atrial septum: There was a very large patent foramen ovale. Agitated saline contrast study showed a large right-to-left atrial level shunt, in the baseline state. Agitated saline contrast study showed a large right-to-left atrial level shunt, following an increase in RA pressure induced by abdominal compression. - Impressions: A Very large PFO is probable source of the paradoxical embolic  stroke.  10-20-16: bilateral lower extremity doppler: No evidence of deep vein or superficial thrombosis involving the right lower extremity and left lower extremity.  10-20-16: myoview:  1. No reversible ischemia or infarction. 2. LEFT ventricle dilatation and global hypokinesia. 3. Left ventricular ejection fraction 43% 4. Non invasive risk stratification*: Intermediate (classification due to low ejection fraction)  10-23-16: patent foramen ovale closure    02-18-17: right humerus x-ray: No acute osseous abnormality.  NO NEW EXAMS     LABS REVIEWED: PREVIOUS:  10-17-16: wbc 12.3; hgb 17.8; hct 48.4; mcv 94.2; plt 257; glucose 101; bun 48; creat 1.80; k+ 3.4; na++ 142; ca 9.2; total bili 1.4; ast 59; albumin 3.7 10-18-16: wbc 9.3; hgb 15.9; hct 45.2; mcv 96.2; plt 219; glucose 97; bun 33; creat 1.56; k+ 3.5; na++ 143; ca 8.7;  hgb a1c 4.7; chol 177; ldl 111; trig 232; hdl 20 4-69-629-26-18: wbc 9.3; hgb 17.0; hct 48.6; mcv 97.4; plt 157; glucose 98; bun 26; creat 1.61; k+ 4.2; na++ 136; ca 8.8  12-28-16: wbc 9.2; hgb 14.1; hct 40.8; mcv 98.9; plt 196; glucose 94; bun 20.9; create 1.64; k+ 4.9; na++ 141; ca 9.1; liver normal albumin 3.9  NO NEW LABS    Review of Systems  Constitutional: Negative for malaise/fatigue.  Respiratory: Negative for cough and shortness of breath.   Cardiovascular: Negative for chest pain, palpitations and leg swelling.  Gastrointestinal: Negative for abdominal pain, constipation and heartburn.  Musculoskeletal: Negative for back pain, joint pain and myalgias.  Skin: Negative.   Neurological: Negative for dizziness.  Psychiatric/Behavioral: The patient is not nervous/anxious.      Physical Exam  Constitutional: He is oriented to person, place, and time. He appears well-developed. No distress.  Neck: No thyromegaly present.  Cardiovascular: Normal rate and regular rhythm.  Murmur heard. 1/6  Pulmonary/Chest: Effort normal and breath sounds normal. No respiratory distress.  Abdominal: Soft. Bowel sounds are normal. He exhibits no distension. There is no tenderness.  Musculoskeletal: He exhibits no edema.  Right side hemiparesis Has increased rigidity on the right side Has left side weakness present   Lymphadenopathy:    He has no cervical adenopathy.  Neurological: He is alert and oriented to person, place, and time.  Skin: Skin is warm and dry. He is not diaphoretic.  Psychiatric: He has a normal mood and affect.     ASSESSMENT/ PLAN:  TODAY:   1. CVA, embolism with right spastic hemiplegia: is stable . Will continue asa 81 mg daily and plavix 75 mg daily  2. Essential hypertension: stable b/p 133/90 is stable will continue norvasc 5 mg daily hydralazine 25 mg twice daily and toprol xl  50 mg twice daily   3. Hyperlipidemia LDL goal <70: stable ldl 111 will continue lipitor 40  mg daily  PREVIOUS:   4.  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 Constipation: stable will continue senna s twice daily   6. PFO (patent foramen ovale) is stable is status post closure (10-23-16): EF 60-65%: will not make changes and will monitor his status.  He does not have insurance and cannot cash pay: he is unable to have a cardiology follow up.    7. Chronic renal disease stage III: without change: bun 20.9; creat 1.64   Check cmp lipids  MD is aware of resident's narcotic use and is in agreement with current plan of care. We will attempt to wean resident as apropriate    Ok Edwards NP [facility]  Contact [phone] Monday through Friday 8am- 5pm  After hours call [phone]

## 2017-04-17 LAB — LIPID PANEL
Cholesterol: 77 (ref 0–200)
HDL: 24 — AB (ref 35–70)
LDL Cholesterol: 20
TRIGLYCERIDES: 163 — AB (ref 40–160)

## 2017-04-17 LAB — BASIC METABOLIC PANEL
BUN: 19 (ref 4–21)
CREATININE: 1.4 — AB (ref 0.6–1.3)
Glucose: 111
POTASSIUM: 3.9 (ref 3.4–5.3)
SODIUM: 138 (ref 137–147)

## 2017-04-17 LAB — HEPATIC FUNCTION PANEL
ALT: 17 (ref 10–40)
AST: 18 (ref 14–40)
Alkaline Phosphatase: 84 (ref 25–125)
BILIRUBIN, TOTAL: 0.2

## 2017-04-17 LAB — PSA: PSA: 2.93

## 2017-05-06 DIAGNOSIS — H2513 Age-related nuclear cataract, bilateral: Secondary | ICD-10-CM | POA: Diagnosis not present

## 2017-05-09 ENCOUNTER — Encounter: Payer: Self-pay | Admitting: Adult Health

## 2017-05-09 ENCOUNTER — Non-Acute Institutional Stay (SKILLED_NURSING_FACILITY): Payer: Medicaid Other | Admitting: Adult Health

## 2017-05-09 DIAGNOSIS — I6340 Cerebral infarction due to embolism of unspecified cerebral artery: Secondary | ICD-10-CM | POA: Diagnosis not present

## 2017-05-09 DIAGNOSIS — Q211 Atrial septal defect: Secondary | ICD-10-CM

## 2017-05-09 DIAGNOSIS — Q2112 Patent foramen ovale: Secondary | ICD-10-CM

## 2017-05-09 DIAGNOSIS — K5909 Other constipation: Secondary | ICD-10-CM | POA: Diagnosis not present

## 2017-05-09 DIAGNOSIS — G8111 Spastic hemiplegia affecting right dominant side: Secondary | ICD-10-CM

## 2017-05-09 NOTE — Progress Notes (Signed)
Location:   [facility] Room Number: 221 A Place of Service:  SNF (31)   CODE STATUS: Full Code  No Known Allergies  Chief Complaint  Patient presents with  . Medical Management of Chronic Issues    Cva; hemiplegia; constipation     HPI:  He is a 62 year old long term of this facility being seen for the management of his chronic illnesses: cva; right hemiplegia; constipation. He is angry with his living situation. He had hopes of going home with his dad; however; his dad now need a wheelchair at home, so this will not be possible. He denies any controlled pain; no constipation; no insomnia. There are no nursing concerns at this time.   Past Medical History:  Diagnosis Date  . Depression   . Hx of pleural effusion   . Hyperlipidemia   . Hypertension     Past Surgical History:  Procedure Laterality Date  . PATENT FORAMEN OVALE(PFO) CLOSURE N/A 10/23/2016   Procedure: Patent Forament Ovale(PFO) Closure;  Surgeon: Yates Decamp, MD;  Location: [facility] INVASIVE CV LAB;  Service: Cardiovascular;  Laterality: N/A;  . TEE WITHOUT CARDIOVERSION N/A 10/19/2016   Procedure: TRANSESOPHAGEAL ECHOCARDIOGRAM (TEE);  Surgeon: Yates Decamp, MD;  Location: [facility] ENDOSCOPY;  Service: Cardiovascular;  Laterality: N/A;  . THORACENTESIS    . ULTRASOUND GUIDANCE FOR VASCULAR ACCESS  10/23/2016   Procedure: Ultrasound Guidance For Vascular Access;  Surgeon: Yates Decamp, MD;  Location: [facility] INVASIVE CV LAB;  Service: Cardiovascular;;    Social History   Socioeconomic History  . Marital status: Legally Separated    Spouse name: Not on file  . Number of children: Not on file  . Years of education: Not on file  . Highest education level: Not on file  Occupational History  . Not on file  Social Needs  . Financial resource strain: Not on file  . Food insecurity:    Worry: Not on file    Inability: Not on file  . Transportation needs:    Medical: Not on file    Non-medical: Not on file    Tobacco Use  . Smoking status: Never Smoker  . Smokeless tobacco: Never Used  Substance and Sexual Activity  . Alcohol use: Yes  . Drug use: No  . Sexual activity: Never  Lifestyle  . Physical activity:    Days per week: Not on file    Minutes per session: Not on file  . Stress: Not on file  Relationships  . Social connections:    Talks on phone: Not on file    Gets together: Not on file    Attends religious service: Not on file    Active member of club or organization: Not on file    Attends meetings of clubs or organizations: Not on file    Relationship status: Not on file  . Intimate partner violence:    Fear of current or ex partner: Not on file    Emotionally abused: Not on file    Physically abused: Not on file    Forced sexual activity: Not on file  Other Topics Concern  . Not on file  Social History Narrative  . Not on file   History reviewed. No pertinent family history.    VITAL SIGNS BP (!) 143/98   Pulse 100   Temp 98.6 F (37 C)   Resp 20   Ht 5\' 11"  (1.803 m)   Wt 216 lb 9.6 oz (98.2 kg)  BMI 30.21 kg/m   Outpatient Encounter Medications as of 05/09/2017  Medication Sig  . acetaminophen (TYLENOL) 650 MG CR tablet Take 650 mg by mouth 3 (three) times daily.   Marland Kitchen amLODipine (NORVASC) 5 MG tablet Take 5 mg by mouth daily.  Marland Kitchen aspirin EC 81 MG tablet Take 81 mg by mouth daily.  Marland Kitchen atorvastatin (LIPITOR) 40 MG tablet Take 1 tablet (40 mg total) by mouth daily at 6 PM.  . clopidogrel (PLAVIX) 75 MG tablet Take 1 tablet (75 mg total) by mouth daily with breakfast.  . diphenhydrAMINE (BENADRYL) 25 mg capsule Take 25 mg by mouth every 6 (six) hours as needed for allergies.  . hydrALAZINE (APRESOLINE) 25 MG tablet Take 25 mg by mouth 2 (two) times daily.  Marland Kitchen HYDROcodone-acetaminophen (NORCO/VICODIN) 5-325 MG tablet Take 1 tablet by mouth every 6 (six) hours as needed for moderate pain.  . Lidocaine 4 % PTCH Apply to right shoulder every 12 hours  . metoprolol  succinate (TOPROL-XL) 25 MG 24 hr tablet Take 25 mg by mouth 2 (two) times daily.  . ondansetron (ZOFRAN) 4 MG tablet Take 4 mg by mouth every 8 (eight) hours as needed for nausea or vomiting.  . senna-docusate (SENOKOT-S) 8.6-50 MG tablet Take 1 tablet by mouth 2 (two) times daily as needed for mild constipation.   Marland Kitchen tiZANidine (ZANAFLEX) 4 MG tablet Take 4 mg by mouth 4 (four) times daily.   . traMADol (ULTRAM) 50 MG tablet Take by mouth every 8 (eight) hours. Hold if confused or sleepy  . traMADol (ULTRAM) 50 MG tablet Take 50 mg by mouth every 6 (six) hours as needed.   No facility-administered encounter medications on file as of 05/09/2017.      SIGNIFICANT DIAGNOSTIC EXAMS  PREVIOUS:   10-17-16: ct head: 1. No acute hemorrhage or definite acute infarct. 2. Advanced chronic small vessel ischemia which could easily obscure an acute white matter or deep gray nucleus infarct. 3. Small remote appearing right frontal cortex infarct.  10-17-16: chest x-ray: No active cardiopulmonary disease.   10-17-16: MRI head; MRA head and neck:  MRI HEAD IMPRESSION:  1. Multifocal acute to early subacute ischemic infarcts involving the bilateral cerebral and left cerebellar hemispheres as above, most prominent of which is a 2 cm infarct at the posterior left lentiform nucleus/corona radiata. No associated hemorrhage or mass effect. Possible central thromboembolic etiology suspected given the various vascular distributions involved. 2. Generalized age-related cerebral atrophy with advanced chronic microvascular ischemic disease. MRA HEAD IMPRESSION: 1. Negative intracranial MRA. No large or proximal arterial branch occlusion. No high-grade or correctable stenosis. 2. Question short-segment moderate to severe bilateral A2 and proximal left M2 stenoses, somewhat limited in evaluation due to extensive motion artifact on this exam. MRA NECK IMPRESSION: 1. Widely patent carotid artery systems bilaterally  without high-grade or critical flow limiting stenosis. 2. Short-segment moderate proximal right V1 stenosis. Vertebral arteries otherwise widely patent within the neck. 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  10-18-16: carotid doppler:  - The vertebral arteries appear patent with antegrade flow. - Findings consistent with a 1- 39 percent stenosis involving the right internal carotid artery and the left internal carotid artery.  10-18-16: right ankle x-ray: No acute osseous abnormality.  10-19-16: TEE:  - Left ventricle: Systolic function was normal. Kogler motion was normal; there were no regional Visconti motion abnormalities. - Mitral valve: There is mild redundant chordae with systolic anterior motion of the chordae without LVOT  obstruction. Trivial prolapse, involving the anterior leaflet. No significant myxomatous degeneration. There was trivial regurgitation. - Left atrium: No evidence of thrombus in the atrial cavity or appendage. - Right atrium: No evidence of thrombus in the atrial cavity or appendage. - Atrial septum: There was a very large patent foramen ovale. Agitated saline contrast study showed a large right-to-left atrial level shunt, in the baseline state. Agitated saline contrast study showed a large right-to-left atrial level shunt, following an increase in RA pressure induced by abdominal compression. - Impressions: A Very large PFO is probable source of the paradoxical embolic stroke.  10-20-16: bilateral lower extremity doppler: No evidence of deep vein or superficial thrombosis involving the right lower extremity and left lower extremity.  10-20-16: myoview:  1. No reversible ischemia or infarction. 2. LEFT ventricle dilatation and global hypokinesia. 3. Left ventricular ejection fraction 43% 4. Non invasive risk stratification*: Intermediate (classification due to low ejection fraction)  10-23-16: patent foramen ovale closure     02-18-17: right humerus x-ray: No acute osseous abnormality.  NO NEW EXAMS     LABS REVIEWED: PREVIOUS:  10-17-16: wbc 12.3; hgb 17.8; hct 48.4; mcv 94.2; plt 257; glucose 101; bun 48; creat 1.80; k+ 3.4; na++ 142; ca 9.2; total bili 1.4; ast 59; albumin 3.7 10-18-16: wbc 9.3; hgb 15.9; hct 45.2; mcv 96.2; plt 219; glucose 97; bun 33; creat 1.56; k+ 3.5; na++ 143; ca 8.7; hgb a1c 4.7; chol 177; ldl 111; trig 232; hdl 20 7-82-95: wbc 9.3; hgb 17.0; hct 48.6; mcv 97.4; plt 157; glucose 98; bun 26; creat 1.61; k+ 4.2; na++ 136; ca 8.8  12-28-16: wbc 9.2; hgb 14.1; hct 40.8; mcv 98.9; plt 196; glucose 94; bun 20.9; create 1.64; k+ 4.9; na++ 141; ca 9.1; liver normal albumin 3.9  TODAY:   04-17-17: glucose 111; bun 18.9; creat 1.37; k+ 3.9; na++ 138; liver normal albumin 4.0; chol 77; ldl 20; trig 163; hdl 24; psa 2.93     Review of Systems  Constitutional: Negative for malaise/fatigue.  Respiratory: Negative for cough and shortness of breath.   Cardiovascular: Negative for chest pain, palpitations and leg swelling.  Gastrointestinal: Negative for abdominal pain, constipation and heartburn.  Musculoskeletal: Negative for back pain, joint pain and myalgias.  Skin: Negative.   Neurological: Negative for dizziness.  Psychiatric/Behavioral: The patient is not nervous/anxious.     Physical Exam  Constitutional: He is oriented to person, place, and time. He appears well-developed and well-nourished. No distress.  Neck: No thyromegaly present.  Cardiovascular: Normal rate, regular rhythm and intact distal pulses.  Murmur heard. Pulmonary/Chest: Effort normal and breath sounds normal. No stridor. No respiratory distress.  Abdominal: Soft. Bowel sounds are normal. He exhibits no distension.  Musculoskeletal: He exhibits no edema.  Right side hemiparesis Has increased rigidity on the right side  Neurological: He is alert and oriented to person, place, and time.  Skin: Skin is warm and dry. He  is not diaphoretic.  Psychiatric: He has a normal mood and affect.    ASSESSMENT/ PLAN:  TODAY:   1.  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 Constipation: stable will continue senna s twice daily   3. PFO (patent foramen ovale) is stable is status post closure (10-23-16): EF 60-65%: will not make changes and will monitor his status.  He does not have insurance and cannot cash pay: he is unable to have a cardiology follow up.    PREVIOUS:   4. Chronic renal disease stage III: without change: bun 18.9; creat 1.37  5. CVA, embolism with right spastic hemiplegia: is stable . Will continue asa 81 mg daily and plavix 75 mg daily  6. Essential hypertension: stable b/p 143/98 is stable will continue norvasc 5 mg daily hydralazine 25 mg twice daily and toprol xl  50 mg twice daily   7. Hyperlipidemia LDL goal <70: stable ldl 20 will continue lipitor 40 mg daily    MD is aware of resident's narcotic use and is in agreement with current plan of care. We will attempt to wean resident as apropriate   Synthia Innocenteborah Wessley Emert NP [facility]  Contact [phone] Monday through Friday 8am- 5pm  After hours call [phone]

## 2017-05-27 ENCOUNTER — Other Ambulatory Visit: Payer: Self-pay

## 2017-05-27 MED ORDER — HYDROCODONE-ACETAMINOPHEN 5-325 MG PO TABS: 1.0000 | ORAL_TABLET | Freq: Four times a day (QID) | ORAL | 0 refills | Status: DC | PRN

## 2017-05-27 MED ORDER — TRAMADOL HCL 50 MG PO TABS: 50.0000 mg | ORAL_TABLET | Freq: Three times a day (TID) | ORAL | 0 refills | Status: DC

## 2017-05-27 NOTE — Telephone Encounter (Signed)
RX faxed to [facility] @ [phone], phone number [phone] 

## 2017-05-29 ENCOUNTER — Other Ambulatory Visit: Payer: Self-pay

## 2017-05-29 MED ORDER — TRAMADOL HCL 50 MG PO TABS: 50.0000 mg | ORAL_TABLET | Freq: Three times a day (TID) | ORAL | 4 refills | Status: DC

## 2017-05-29 MED ORDER — HYDROCODONE-ACETAMINOPHEN 5-325 MG PO TABS: 1.0000 | ORAL_TABLET | Freq: Four times a day (QID) | ORAL | 0 refills | Status: DC | PRN

## 2017-05-29 NOTE — Telephone Encounter (Signed)
Rx faxed to [facility] (p) [phone], (f) [phone]  

## 2017-06-12 ENCOUNTER — Non-Acute Institutional Stay (SKILLED_NURSING_FACILITY): Payer: Medicaid Other | Admitting: Adult Health

## 2017-06-12 ENCOUNTER — Encounter: Payer: Self-pay | Admitting: Adult Health

## 2017-06-12 DIAGNOSIS — Z8774 Personal history of (corrected) congenital malformations of heart and circulatory system: Secondary | ICD-10-CM | POA: Diagnosis not present

## 2017-06-12 DIAGNOSIS — E782 Mixed hyperlipidemia: Secondary | ICD-10-CM | POA: Diagnosis not present

## 2017-06-12 DIAGNOSIS — I6930 Unspecified sequelae of cerebral infarction: Secondary | ICD-10-CM | POA: Diagnosis not present

## 2017-06-12 DIAGNOSIS — G8111 Spastic hemiplegia affecting right dominant side: Secondary | ICD-10-CM

## 2017-06-12 DIAGNOSIS — I6340 Cerebral infarction due to embolism of unspecified cerebral artery: Secondary | ICD-10-CM

## 2017-06-12 DIAGNOSIS — Q211 Atrial septal defect: Secondary | ICD-10-CM | POA: Diagnosis not present

## 2017-06-12 DIAGNOSIS — Q2112 Patent foramen ovale: Secondary | ICD-10-CM

## 2017-06-12 DIAGNOSIS — I10 Essential (primary) hypertension: Secondary | ICD-10-CM | POA: Diagnosis not present

## 2017-06-12 NOTE — Progress Notes (Signed)
Location:   [facility] Room Number: 221 A Place of Service:  SNF (31)   CODE STATUS: Full Code  No Known Allergies  Chief Complaint  Patient presents with  . Acute Visit    Care Plan Meeting    HPI:  We have come together for his care plan meeting. He does have family present; however he is able to participate in the meeting. He will not be able to home with his dad. We have discussed assisted livings. He is interested in the assisted living. He and the Child psychotherapist will work on this option for him. He denies any difficulty sleeping; no headaches; no joint pain. We did discuss his advanced directives including MOST form. We did discuss treatment options with the pros and cons involved. The MOST form has been filled out. He verbalized understanding.    Past Medical History:  Diagnosis Date  . Depression   . Hx of pleural effusion   . Hyperlipidemia   . Hypertension     Past Surgical History:  Procedure Laterality Date  . PATENT FORAMEN OVALE(PFO) CLOSURE N/A 10/23/2016   Procedure: Patent Forament Ovale(PFO) Closure;  Surgeon: Yates Decamp, MD;  Location: [facility] INVASIVE CV LAB;  Service: Cardiovascular;  Laterality: N/A;  . TEE WITHOUT CARDIOVERSION N/A 10/19/2016   Procedure: TRANSESOPHAGEAL ECHOCARDIOGRAM (TEE);  Surgeon: Yates Decamp, MD;  Location: [facility] ENDOSCOPY;  Service: Cardiovascular;  Laterality: N/A;  . THORACENTESIS    . ULTRASOUND GUIDANCE FOR VASCULAR ACCESS  10/23/2016   Procedure: Ultrasound Guidance For Vascular Access;  Surgeon: Yates Decamp, MD;  Location: [facility] INVASIVE CV LAB;  Service: Cardiovascular;;    Social History   Socioeconomic History  . Marital status: Legally Separated    Spouse name: Not on file  . Number of children: Not on file  . Years of education: Not on file  . Highest education level: Not on file  Occupational History  . Not on file  Social Needs  . Financial resource strain: Not on file  . Food insecurity:    Worry: Not  on file    Inability: Not on file  . Transportation needs:    Medical: Not on file    Non-medical: Not on file  Tobacco Use  . Smoking status: Never Smoker  . Smokeless tobacco: Never Used  Substance and Sexual Activity  . Alcohol use: Yes  . Drug use: No  . Sexual activity: Never  Lifestyle  . Physical activity:    Days per week: Not on file    Minutes per session: Not on file  . Stress: Not on file  Relationships  . Social connections:    Talks on phone: Not on file    Gets together: Not on file    Attends religious service: Not on file    Active member of club or organization: Not on file    Attends meetings of clubs or organizations: Not on file    Relationship status: Not on file  . Intimate partner violence:    Fear of current or ex partner: Not on file    Emotionally abused: Not on file    Physically abused: Not on file    Forced sexual activity: Not on file  Other Topics Concern  . Not on file  Social History Narrative  . Not on file   History reviewed. No pertinent family history.    VITAL SIGNS BP (!) 141/90   Pulse 76   Temp 98.1 F (36.7 C)  Resp 16   Ht  (1.803 m)   Wt 216 lb 9.6 oz (98.2 kg)   SpO2 96%   BMI 30.21 kg/m   Outpatient Encounter Medications as of 06/12/2017  Medication Sig  . acetaminophen (TYLENOL) 650 MG CR tablet Take 650 mg by mouth 3 (three) times daily.   Marland Kitchen amLODipine (NORVASC) 5 MG tablet Take 5 mg by mouth daily.  Marland Kitchen aspirin EC 81 MG tablet Take 81 mg by mouth daily.  Marland Kitchen atorvastatin (LIPITOR) 40 MG tablet Take 1 tablet (40 mg total) by mouth daily at 6 PM.  . clopidogrel (PLAVIX) 75 MG tablet Take 1 tablet (75 mg total) by mouth daily with breakfast.  . diphenhydrAMINE (BENADRYL) 25 mg capsule Take 25 mg by mouth every 6 (six) hours as needed for allergies.  . hydrALAZINE (APRESOLINE) 25 MG tablet Take 25 mg by mouth 2 (two) times daily.  Marland Kitchen HYDROcodone-acetaminophen (NORCO/VICODIN) 5-325 MG tablet Take 1 tablet by  mouth every 6 (six) hours as needed for moderate pain.  . Lidocaine 4 % PTCH Apply to right shoulder every 12 hours  . metoprolol succinate (TOPROL-XL) 25 MG 24 hr tablet Take 25 mg by mouth 2 (two) times daily.  . ondansetron (ZOFRAN) 4 MG tablet Take 4 mg by mouth every 8 (eight) hours as needed for nausea or vomiting.  . senna-docusate (SENOKOT-S) 8.6-50 MG tablet Take 1 tablet by mouth 2 (two) times daily as needed for mild constipation.   Marland Kitchen tiZANidine (ZANAFLEX) 4 MG tablet Take 4 mg by mouth 4 (four) times daily.   . traMADol (ULTRAM) 50 MG tablet Take 1 tablet (50 mg total) by mouth every 8 (eight) hours. Hold if confused or sleepy   No facility-administered encounter medications on file as of 06/12/2017.      SIGNIFICANT DIAGNOSTIC EXAMS  PREVIOUS:   10-17-16: ct head: 1. No acute hemorrhage or definite acute infarct. 2. Advanced chronic small vessel ischemia which could easily obscure an acute white matter or deep gray nucleus infarct. 3. Small remote appearing right frontal cortex infarct.  10-17-16: chest x-ray: No active cardiopulmonary disease.   10-17-16: MRI head; MRA head and neck:  MRI HEAD IMPRESSION:  1. Multifocal acute to early subacute ischemic infarcts involving the bilateral cerebral and left cerebellar hemispheres as above, most prominent of which is a 2 cm infarct at the posterior left lentiform nucleus/corona radiata. No associated hemorrhage or mass effect. Possible central thromboembolic etiology suspected given the various vascular distributions involved. 2. Generalized age-related cerebral atrophy with advanced chronic microvascular ischemic disease. MRA HEAD IMPRESSION: 1. Negative intracranial MRA. No large or proximal arterial branch occlusion. No high-grade or correctable stenosis. 2. Question short-segment moderate to severe bilateral A2 and proximal left M2 stenoses, somewhat limited in evaluation due to extensive motion artifact on this exam. MRA  NECK IMPRESSION: 1. Widely patent carotid artery systems bilaterally without high-grade or critical flow limiting stenosis. 2. Short-segment moderate proximal right V1 stenosis. Vertebral arteries otherwise widely patent within the neck. 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  10-18-16: carotid doppler:  - The vertebral arteries appear patent with antegrade flow. - Findings consistent with a 1- 39 percent stenosis involving the right internal carotid artery and the left internal carotid artery.  10-18-16: right ankle x-ray: No acute osseous abnormality.  10-19-16: TEE:  - Left ventricle: Systolic function was normal. Findling motion was normal; there were no regional Coard motion abnormalities. - Mitral valve: There is mild  redundant chordae with systolic anterior motion of the chordae without LVOT obstruction. Trivial prolapse, involving the anterior leaflet. No significant myxomatous degeneration. There was trivial regurgitation. - Left atrium: No evidence of thrombus in the atrial cavity or appendage. - Right atrium: No evidence of thrombus in the atrial cavity or appendage. - Atrial septum: There was a very large patent foramen ovale. Agitated saline contrast study showed a large right-to-left atrial level shunt, in the baseline state. Agitated saline contrast study showed a large right-to-left atrial level shunt, following an increase in RA pressure induced by abdominal compression. - Impressions: A Very large PFO is probable source of the paradoxical embolic stroke.  10-20-16: bilateral lower extremity doppler: No evidence of deep vein or superficial thrombosis involving the right lower extremity and left lower extremity.  10-20-16: myoview:  1. No reversible ischemia or infarction. 2. LEFT ventricle dilatation and global hypokinesia. 3. Left ventricular ejection fraction 43% 4. Non invasive risk stratification*: Intermediate (classification due to  low ejection fraction)  10-23-16: patent foramen ovale closure    02-18-17: right humerus x-ray: No acute osseous abnormality.  NO NEW EXAMS     LABS REVIEWED: PREVIOUS:  10-17-16: wbc 12.3; hgb 17.8; hct 48.4; mcv 94.2; plt 257; glucose 101; bun 48; creat 1.80; k+ 3.4; na++ 142; ca 9.2; total bili 1.4; ast 59; albumin 3.7 10-18-16: wbc 9.3; hgb 15.9; hct 45.2; mcv 96.2; plt 219; glucose 97; bun 33; creat 1.56; k+ 3.5; na++ 143; ca 8.7; hgb a1c 4.7; chol 177; ldl 111; trig 232; hdl 20 1-61-09: wbc 9.3; hgb 17.0; hct 48.6; mcv 97.4; plt 157; glucose 98; bun 26; creat 1.61; k+ 4.2; na++ 136; ca 8.8  12-28-16: wbc 9.2; hgb 14.1; hct 40.8; mcv 98.9; plt 196; glucose 94; bun 20.9; create 1.64; k+ 4.9; na++ 141; ca 9.1; liver normal albumin 3.9 04-17-17: glucose 111; bun 18.9; creat 1.37; k+ 3.9; na++ 138; liver normal albumin 4.0; chol 77; ldl 20; trig 163; hdl 24; psa 2.93    NO NEW LABS.    Review of Systems  Constitutional: Negative for malaise/fatigue.  Respiratory: Negative for cough and shortness of breath.   Cardiovascular: Negative for chest pain, palpitations and leg swelling.  Gastrointestinal: Negative for abdominal pain, constipation and heartburn.  Musculoskeletal: Negative for back pain, joint pain and myalgias.  Skin: Negative.   Neurological: Negative for dizziness.  Psychiatric/Behavioral: The patient is not nervous/anxious.     Physical Exam  Constitutional: He is oriented to person, place, and time. He appears well-developed and well-nourished. No distress.  Neck: No thyromegaly present.  Cardiovascular: Normal rate, regular rhythm and intact distal pulses.  Murmur heard. 1/6  Pulmonary/Chest: Effort normal and breath sounds normal. No stridor. No respiratory distress.  Abdominal: Soft. Bowel sounds are normal. He exhibits no distension. There is no tenderness.  Musculoskeletal: He exhibits no edema.  Right side hemiparesis  Lymphadenopathy:    He has no cervical  adenopathy.  Neurological: He is alert and oriented to person, place, and time.  Skin: Skin is warm and dry. He is not diaphoretic.  Psychiatric: He has a normal mood and affect.   d and affect.    ASSESSMENT/ PLAN:  TODAY:   1.  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 PFO (patent foramen ovale)   Will continue his current plan of care Will not change medications Will start to look for assisted living environments for him  Time spent with patient 45 minutes: (20 minutes spent with advanced directives): discussed  his medical status; his care needs; goals of care including assisted living. Discussed advanced directives and have filled out MOST form; with no tube feeding. Verbalized understanding.    MD is aware of resident's narcotic use and is in agreement with current plan of care. We will attempt to wean resident as apropriate   Synthia Innocent NP [facility]  Contact [phone] Monday through Friday 8am- 5pm  After hours call [phone]

## 2017-06-14 ENCOUNTER — Non-Acute Institutional Stay (SKILLED_NURSING_FACILITY): Payer: Medicaid Other | Admitting: Adult Health

## 2017-06-14 ENCOUNTER — Encounter: Payer: Self-pay | Admitting: Adult Health

## 2017-06-14 DIAGNOSIS — I10 Essential (primary) hypertension: Secondary | ICD-10-CM

## 2017-06-14 DIAGNOSIS — N183 Chronic kidney disease, stage 3 unspecified: Secondary | ICD-10-CM

## 2017-06-14 DIAGNOSIS — E785 Hyperlipidemia, unspecified: Secondary | ICD-10-CM

## 2017-06-14 NOTE — Progress Notes (Signed)
Location:   [facility] Room Number: 221 A Place of Service:  SNF (31)   CODE STATUS: Full Code (Most form effective date 06/12/17)  No Known Allergies  Chief Complaint  Patient presents with  . Medical Management of Chronic Issues    ckd stage 3; hypertension; hyperlipidemia     HPI:  He is a 62 year old long term resident of this facility being seen for the management of his chronic illnesses: ckd stage 3; hypertension; hyperlipidemia. He denies any uncontrolled pain; no headaches or changes in vision. There are no nursing concerns at this time.   Past Medical History:  Diagnosis Date  . Depression   . Hx of pleural effusion   . Hyperlipidemia   . Hypertension     Past Surgical History:  Procedure Laterality Date  . PATENT FORAMEN OVALE(PFO) CLOSURE N/A 10/23/2016   Procedure: Patent Forament [place]vale(PFO) Closure;  Surgeon: Yates Decamp, MD;  Location: [facility] INVASIVE CV LAB;  Service: Cardiovascular;  Laterality: N/A;  . TEE WITHOUT CARDIOVERSION N/A 10/19/2016   Procedure: TRANSESOPHAGEAL ECHOCARDIOGRAM (TEE);  Surgeon: Yates Decamp, MD;  Location: [facility] ENDOSCOPY;  Service: Cardiovascular;  Laterality: N/A;  . THORACENTESIS    . ULTRASOUND GUIDANCE FOR VASCULAR ACCESS  10/23/2016   Procedure: Ultrasound Guidance For Vascular Access;  Surgeon: Yates Decamp, MD;  Location: [facility] INVASIVE CV LAB;  Service: Cardiovascular;;    Social History   Socioeconomic History  . Marital status: Legally Separated    Spouse name: Not on file  . Number of children: Not on file  . Years of education: Not on file  . Highest education level: Not on file  Occupational History  . Not on file  Social Needs  . Financial resource strain: Not on file  . Food insecurity:    Worry: Not on file    Inability: Not on file  . Transportation needs:    Medical: Not on file    Non-medical: Not on file  Tobacco Use  . Smoking status: Never Smoker  . Smokeless tobacco: Never Used  Substance and  Sexual Activity  . Alcohol use: Yes  . Drug use: No  . Sexual activity: Never  Lifestyle  . Physical activity:    Days per week: Not on file    Minutes per session: Not on file  . Stress: Not on file  Relationships  . Social connections:    Talks on phone: Not on file    Gets together: Not on file    Attends religious service: Not on file    Active member of club or organization: Not on file    Attends meetings of clubs or organizations: Not on file    Relationship status: Not on file  . Intimate partner violence:    Fear of current or ex partner: Not on file    Emotionally abused: Not on file    Physically abused: Not on file    Forced sexual activity: Not on file  Other Topics Concern  . Not on file  Social History Narrative  . Not on file   History reviewed. No pertinent family history.    VITAL SIGNS BP (!) 144/97   Pulse 80   Temp 98.1 F (36.7 C)   Resp 16   Ht  (1.803 m)   Wt 207 lb 9.6 oz (94.2 kg)   SpO2 96%   BMI 28.95 kg/m   Outpatient Encounter Medications as of 06/14/2017  Medication Sig  . acetaminophen (  TYLENOL) 650 MG CR tablet Take 650 mg by mouth 3 (three) times daily.   Marland Kitchen amLODipine (NORVASC) 5 MG tablet Take 5 mg by mouth daily.  Marland Kitchen aspirin EC 81 MG tablet Take 81 mg by mouth daily.  Marland Kitchen atorvastatin (LIPITOR) 40 MG tablet Take 1 tablet (40 mg total) by mouth daily at 6 PM.  . clopidogrel (PLAVIX) 75 MG tablet Take 1 tablet (75 mg total) by mouth daily with breakfast.  . diphenhydrAMINE (BENADRYL) 25 mg capsule Take 25 mg by mouth every 6 (six) hours as needed for allergies.  . hydrALAZINE (APRESOLINE) 25 MG tablet Take 25 mg by mouth 2 (two) times daily.  Marland Kitchen HYDROcodone-acetaminophen (NORCO/VICODIN) 5-325 MG tablet Take 1 tablet by mouth every 6 (six) hours as needed for moderate pain.  . Lidocaine 4 % PTCH Apply to right shoulder every 12 hours  . metoprolol succinate (TOPROL-XL) 25 MG 24 hr tablet Take 25 mg by mouth 2 (two) times daily.    . ondansetron (ZOFRAN) 4 MG tablet Take 4 mg by mouth every 8 (eight) hours as needed for nausea or vomiting.  . senna-docusate (SENOKOT-S) 8.6-50 MG tablet Take 1 tablet by mouth 2 (two) times daily as needed for mild constipation.   Marland Kitchen tiZANidine (ZANAFLEX) 4 MG tablet Take 4 mg by mouth 4 (four) times daily.   . traMADol (ULTRAM) 50 MG tablet Take 1 tablet (50 mg total) by mouth every 8 (eight) hours. Hold if confused or sleepy   No facility-administered encounter medications on file as of 06/14/2017.      SIGNIFICANT DIAGNOSTIC EXAMS  PREVIOUS:   10-17-16: ct head: 1. No acute hemorrhage or definite acute infarct. 2. Advanced chronic small vessel ischemia which could easily obscure an acute white matter or deep gray nucleus infarct. 3. Small remote appearing right frontal cortex infarct.  10-17-16: chest x-ray: No active cardiopulmonary disease.   10-17-16: MRI head; MRA head and neck:  MRI HEAD IMPRESSION:  1. Multifocal acute to early subacute ischemic infarcts involving the bilateral cerebral and left cerebellar hemispheres as above, most prominent of which is a 2 cm infarct at the posterior left lentiform nucleus/corona radiata. No associated hemorrhage or mass effect. Possible central thromboembolic etiology suspected given the various vascular distributions involved. 2. Generalized age-related cerebral atrophy with advanced chronic microvascular ischemic disease. MRA HEAD IMPRESSION: 1. Negative intracranial MRA. No large or proximal arterial branch occlusion. No high-grade or correctable stenosis. 2. Question short-segment moderate to severe bilateral A2 and proximal left M2 stenoses, somewhat limited in evaluation due to extensive motion artifact on this exam. MRA NECK IMPRESSION: 1. Widely patent carotid artery systems bilaterally without high-grade or critical flow limiting stenosis. 2. Short-segment moderate proximal right V1 stenosis. Vertebral arteries otherwise widely  patent within the neck. 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  10-18-16: carotid doppler:  - The vertebral arteries appear patent with antegrade flow. - Findings consistent with a 1- 39 percent stenosis involving the right internal carotid artery and the left internal carotid artery.  10-18-16: right ankle x-ray: No acute osseous abnormality.  10-19-16: TEE:  - Left ventricle: Systolic function was normal. Devivo motion was normal; there were no regional Mcmackin motion abnormalities. - Mitral valve: There is mild redundant chordae with systolic anterior motion of the chordae without LVOT obstruction. Trivial prolapse, involving the anterior leaflet. No significant myxomatous degeneration. There was trivial regurgitation. - Left atrium: No evidence of thrombus in the atrial cavity or appendage. -  Right atrium: No evidence of thrombus in the atrial cavity or appendage. - Atrial septum: There was a very large patent foramen ovale. Agitated saline contrast study showed a large right-to-left atrial level shunt, in the baseline state. Agitated saline contrast study showed a large right-to-left atrial level shunt, following an increase in RA pressure induced by abdominal compression. - Impressions: A Very large PFO is probable source of the paradoxical embolic stroke.  10-20-16: bilateral lower extremity doppler: No evidence of deep vein or superficial thrombosis involving the right lower extremity and left lower extremity.  10-20-16: myoview:  1. No reversible ischemia or infarction. 2. LEFT ventricle dilatation and global hypokinesia. 3. Left ventricular ejection fraction 43% 4. Non invasive risk stratification*: Intermediate (classification due to low ejection fraction)  10-23-16: patent foramen ovale closure    02-18-17: right humerus x-ray: No acute osseous abnormality.  NO NEW EXAMS     LABS REVIEWED: PREVIOUS:  10-17-16: wbc 12.3; hgb 17.8; hct  48.4; mcv 94.2; plt 257; glucose 101; bun 48; creat 1.80; k+ 3.4; na++ 142; ca 9.2; total bili 1.4; ast 59; albumin 3.7 10-18-16: wbc 9.3; hgb 15.9; hct 45.2; mcv 96.2; plt 219; glucose 97; bun 33; creat 1.56; k+ 3.5; na++ 143; ca 8.7; hgb a1c 4.7; chol 177; ldl 111; trig 232; hdl 20 1-61-09: wbc 9.3; hgb 17.0; hct 48.6; mcv 97.4; plt 157; glucose 98; bun 26; creat 1.61; k+ 4.2; na++ 136; ca 8.8  12-28-16: wbc 9.2; hgb 14.1; hct 40.8; mcv 98.9; plt 196; glucose 94; bun 20.9; create 1.64; k+ 4.9; na++ 141; ca 9.1; liver normal albumin 3.9 04-17-17: glucose 111; bun 18.9; creat 1.37; k+ 3.9; na++ 138; liver normal albumin 4.0; chol 77; ldl 20; trig 163; hdl 24; psa 2.93    NO NEW LABS.    Review of Systems  Constitutional: Negative for malaise/fatigue.  Respiratory: Negative for cough and shortness of breath.   Cardiovascular: Negative for chest pain, palpitations and leg swelling.  Gastrointestinal: Negative for abdominal pain, constipation and heartburn.  Musculoskeletal: Negative for back pain, joint pain and myalgias.  Skin: Negative.   Neurological: Negative for dizziness.  Psychiatric/Behavioral: The patient is not nervous/anxious.       Physical Exam  Constitutional: He is oriented to person, place, and time. He appears well-developed and well-nourished. No distress.  Neck: No thyromegaly present.  Cardiovascular: Normal rate, regular rhythm and intact distal pulses.  Murmur heard. 1/6  Pulmonary/Chest: Effort normal and breath sounds normal. No stridor. No respiratory distress.  Abdominal: Soft. Bowel sounds are normal. He exhibits no distension. There is no tenderness.  Musculoskeletal: He exhibits no edema.  Right side hemiparesis   Lymphadenopathy:    He has no cervical adenopathy.  Neurological: He is alert and oriented to person, place, and time.  Skin: Skin is warm and dry. He is not diaphoretic.  Psychiatric: He has a normal mood and affect.     ASSESSMENT/  PLAN:  TODAY:   1. Chronic renal disease stage III: without change: bun 18.9; creat 1.37  2. Essential hypertension: stable b/p 144/97 is worse will continue  hydralazine 25 mg twice daily and toprol xl  25 mg twice daily   Will increase norvasc to 10 mg daily and will monitor   3. Hyperlipidemia LDL goal <70: stable ldl 20 will continue lipitor 40 mg daily  PREVIOUS:   4.  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 Constipation: stable will continue senna s twice daily   6. PFO (patent foramen ovale) is stable is status post closure (10-23-16): EF 60-65%: will not make changes and will monitor his status.      MD is aware of resident's narcotic use and is in agreement with current plan of care. We will attempt to wean resident as apropriate   Synthia Innocent NP [facility] Adult [facility]  Contact [phone] Monday through Friday 8am- 5pm  After hours call [phone]

## 2017-07-11 ENCOUNTER — Non-Acute Institutional Stay (SKILLED_NURSING_FACILITY): Payer: Medicaid Other | Admitting: Internal Medicine

## 2017-07-11 ENCOUNTER — Encounter: Payer: Self-pay | Admitting: Internal Medicine

## 2017-07-11 DIAGNOSIS — E785 Hyperlipidemia, unspecified: Secondary | ICD-10-CM | POA: Diagnosis not present

## 2017-07-11 DIAGNOSIS — I69359 Hemiplegia and hemiparesis following cerebral infarction affecting unspecified side: Secondary | ICD-10-CM | POA: Diagnosis not present

## 2017-07-11 DIAGNOSIS — M79601 Pain in right arm: Secondary | ICD-10-CM

## 2017-07-11 DIAGNOSIS — G8111 Spastic hemiplegia affecting right dominant side: Secondary | ICD-10-CM

## 2017-07-11 DIAGNOSIS — I10 Essential (primary) hypertension: Secondary | ICD-10-CM | POA: Diagnosis not present

## 2017-07-11 DIAGNOSIS — N183 Chronic kidney disease, stage 3 unspecified: Secondary | ICD-10-CM

## 2017-07-11 NOTE — Progress Notes (Signed)
Patient ID: Brandon [name], male   DOB: Oct 17, 1955, 62 y.o.   MRN: [id number]  Location:  [facility] Room Number: 221 A Place of Service:  SNF (31) Provider:  DR Alexis Mizuno Ivan Croft, DO  Patient Care Team: Kirt Boys, DO as PCP - General (Internal Medicine) Sharee Holster, NP as Nurse Practitioner (Geriatric Medicine) Center, [facility] ([facility])  Extended Emergency Contact Information Primary Emergency Contact: Zain, Lankford Address: [address]          [place] [address] [place] Home Phone: [phone] Relation: Father Secondary Emergency Contact: Colwell,Lisa Mobile Phone: [phone] Relation: Sister  Code Status:  Full Code Goals of care: Advanced Directive information Advanced Directives 07/11/2017  Does Patient Have a Medical Advance Directive? Yes  Type of Advance Directive Out of facility DNR (pink MOST or yellow form)  Does patient want to make changes to medical advance directive? No - Patient declined  Would patient like information on creating a medical advance directive? No - Patient declined  Pre-existing out of facility DNR order (yellow form or pink MOST form) Pink MOST form placed in chart (order not valid for inpatient use)     Chief Complaint  Patient presents with  . Medical Management of Chronic Issues    1 month follow up    HPI:  Pt is a 62 y.o. male seen today for medical management of chronic diseases. He c/o not getting showers. He also c/o RUE weakness unchanged. He completed PT/OT. No falls. SNF labs reviewed - PSA 2.93  CKD - stage 3. 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 He also takes ASA 81 mg daily and plavix 75 mg daily   Constipation - stable on senna s twice daily   Hx PFO (patent foramen ovale) - s/p repair (10-23-16); EF 60-65%. He takes ASA daily     Past Medical History:  Diagnosis Date  . Depression   . Hx of pleural effusion   . Hyperlipidemia   . Hypertension    Past Surgical History:  Procedure Laterality Date  . PATENT FORAMEN OVALE(PFO) CLOSURE N/A 10/23/2016   Procedure: Patent Forament Ovale(PFO) Closure;  Surgeon: Yates Decamp, MD;  Location: [facility] INVASIVE CV LAB;  Service: Cardiovascular;  Laterality: N/A;  . TEE WITHOUT CARDIOVERSION N/A 10/19/2016   Procedure: TRANSESOPHAGEAL ECHOCARDIOGRAM (TEE);  Surgeon: Yates Decamp, MD;  Location: [facility] ENDOSCOPY;  Service: Cardiovascular;  Laterality: N/A;  . THORACENTESIS    . ULTRASOUND GUIDANCE FOR VASCULAR ACCESS  10/23/2016   Procedure: Ultrasound Guidance For Vascular Access;  Surgeon: Yates Decamp, MD;  Location: [facility] INVASIVE CV LAB;  Service: Cardiovascular;;    No Known Allergies  Outpatient Encounter Medications as of 07/11/2017  Medication Sig  . acetaminophen (TYLENOL) 650 MG CR tablet Take 650 mg by mouth 3 (three) times daily.   Marland Kitchen amLODipine (NORVASC) 10 MG tablet Take 10 mg by mouth daily.   Marland Kitchen aspirin EC 81 MG tablet Take 81 mg by mouth daily.  Marland Kitchen atorvastatin (LIPITOR) 40 MG tablet Take 1 tablet (40 mg  total) by mouth daily at 6 PM.  . clopidogrel (PLAVIX) 75 MG tablet Take 1 tablet (75 mg total) by mouth daily with breakfast.  . diphenhydrAMINE (BENADRYL) 25 mg capsule Take 25 mg by mouth every 6 (six) hours as needed for allergies.  . hydrALAZINE (APRESOLINE) 25 MG tablet Take 25 mg by mouth 2 (two) times daily.  Marland Kitchen. HYDROcodone-acetaminophen (NORCO/VICODIN) 5-325 MG tablet Take 1 tablet by mouth every 6 (six) hours as needed for moderate pain.  . Lidocaine 4 % PTCH Apply to right shoulder every 12 hours  . metoprolol succinate (TOPROL-XL) 25  MG 24 hr tablet Take 25 mg by mouth 2 (two) times daily.  . ondansetron (ZOFRAN) 4 MG tablet Take 4 mg by mouth every 8 (eight) hours as needed for nausea or vomiting.  . senna-docusate (SENOKOT-S) 8.6-50 MG tablet Take 1 tablet by mouth 2 (two) times daily as needed for mild constipation.   Marland Kitchen. tiZANidine (ZANAFLEX) 4 MG tablet Take 4 mg by mouth 4 (four) times daily.   . traMADol (ULTRAM) 50 MG tablet Give 1 tablet by mouth two times daily for moderate to severe pain and give 2 tablets by mouth at bedtime  . [DISCONTINUED] traMADol (ULTRAM) 50 MG tablet Take 1 tablet (50 mg total) by mouth every 8 (eight) hours. Hold if confused or sleepy (Patient not taking: Reported on 07/11/2017)   No facility-administered encounter medications on file as of 07/11/2017.     Review of Systems  Musculoskeletal: Positive for arthralgias and gait problem.  Neurological: Positive for weakness.  All other systems reviewed and are negative.   Immunization History  Administered Date(s) Administered  . Influenza-Unspecified 11/21/2016  . PPD Test 11/04/2016  . Tdap 01/11/2010   Pertinent  Health Maintenance Due  Topic Date Due  . COLONOSCOPY  10/29/2017 (Originally 02/26/2006)  . INFLUENZA VACCINE  08/29/2017   No flowsheet data found. Functional Status Survey:    Vitals:   07/11/17 1119  BP: 120/74  Pulse: 72  Resp: 18  Temp: 98.1 F (36.7 C)  SpO2: 98%  Weight: 207 lb (93.9 kg)  Height: 5\' 11"  (1.803 m)   Body mass index is 28.87 kg/m. Physical Exam  Constitutional: He appears well-developed and well-nourished.  Sitting up in bed eating in NAD  HENT:  Mouth/Throat: Oropharynx is clear and moist.  MMM; no oral thrush  Eyes: Pupils are equal, round, and reactive to light. No scleral icterus.  Neck: Neck supple. Carotid bruit is not present. No thyromegaly present.  Cardiovascular: Normal rate, regular rhythm and intact distal pulses. Exam reveals no gallop and no friction rub.  Murmur (1/6  SEM) heard. Right sided swelling  Pulmonary/Chest: Effort normal and breath sounds normal. He has no wheezes. He has no rales. He exhibits no tenderness.  Abdominal: Soft. Bowel sounds are normal. He exhibits no distension, no abdominal bruit, no pulsatile midline mass and no mass. There is no hepatomegaly. There is no tenderness. There is no rebound and no guarding.  Musculoskeletal: He exhibits edema.  Lymphadenopathy:    He has no cervical adenopathy.  Neurological: He is alert. He has normal reflexes. Gait abnormal.  RUE hemiplegia; right sided hemiparesis  Skin: Skin is warm and dry. No rash noted.  Psychiatric: He has a normal mood and affect. His behavior is normal. Thought content normal.    Labs reviewed: Recent Labs    10/23/16 0533 10/24/16 0301 10/25/16 0215 12/28/16  NA 137 136 134* 141  K 4.0 4.2 4.1 4.9  CL 106 105 103  --   CO2 22 21* 26  --   GLUCOSE 94 98 96  --   BUN 25* 26* 24* 21  CREATININE 1.58* 1.61* 1.65* 1.6*  CALCIUM 8.8* 8.8* 8.7*  --    Recent Labs    10/17/16 0850 12/28/16  AST 59* 21  ALT 48 21  ALKPHOS 72 67  BILITOT 1.4*  --   PROT 8.0  --   ALBUMIN 3.7  --    Recent Labs    10/17/16 0850  10/21/16 0523 10/23/16 0533 10/24/16 0301 12/28/16  WBC 12.3*   < > 9.4 9.1 9.3 9.2  NEUTROABS 8.1*  --   --   --   --  6  HGB 17.8*   < > 16.6 16.1 17.0 14.1  HCT 48.4   < > 48.4 46.2 48.6 41  MCV 94.2   < > 98.2 97.1 97.4  --   PLT 257   < > 199 195 157 196   < > = values in this interval not displayed.   No results found for: TSH Lab Results  Component Value Date   HGBA1C 4.7 (L) 10/18/2016   Lab Results  Component Value Date   CHOL 177 10/18/2016   HDL 20 (L) 10/18/2016   LDLCALC 111 (H) 10/18/2016   TRIG 232 (H) 10/18/2016   CHOLHDL 8.9 10/18/2016    Significant Diagnostic Results in last 30 days:  No results found.  Assessment/Plan   ICD-10-CM   1. Pain of right upper extremity M79.601    due to #2 and 3  2. Right  spastic hemiplegia (HCC) G81.11   3. 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 CKD (chronic kidney disease) stage 3, GFR 30-59 ml/min (HCC) N18.3   5. Essential hypertension I10   6. Hyperlipidemia LDL goal <70 E78.5     PT/OT to RUE for increased weakness and pain  Cont current meds as ordered  ST as indicated  Will follow  Labs/tests ordered: none   Millissa Deese S. Ancil Linsey  [facility] [address] [place], [place] [address] [phone] Cell (Monday-Friday 8 AM - 5 PM) [phone] After 5 PM and follow prompts

## 2017-07-29 DIAGNOSIS — I10 Essential (primary) hypertension: Secondary | ICD-10-CM | POA: Diagnosis not present

## 2017-08-06 ENCOUNTER — Other Ambulatory Visit: Payer: Self-pay

## 2017-08-06 DIAGNOSIS — Z09 Encounter for follow-up examination after completed treatment for conditions other than malignant neoplasm: Secondary | ICD-10-CM | POA: Diagnosis not present

## 2017-08-06 DIAGNOSIS — Z9889 Other specified postprocedural states: Secondary | ICD-10-CM | POA: Diagnosis not present

## 2017-08-06 MED ORDER — TRAMADOL HCL 50 MG PO TABS: ORAL_TABLET | ORAL | 0 refills | Status: DC

## 2017-08-06 NOTE — Telephone Encounter (Signed)
Rx faxed to [facility] (P) [phone], (F) [phone] 

## 2017-08-08 ENCOUNTER — Non-Acute Institutional Stay (SKILLED_NURSING_FACILITY): Payer: Medicaid Other | Admitting: Adult Health

## 2017-08-08 ENCOUNTER — Encounter: Payer: Self-pay | Admitting: Adult Health

## 2017-08-08 DIAGNOSIS — I10 Essential (primary) hypertension: Secondary | ICD-10-CM | POA: Diagnosis not present

## 2017-08-08 DIAGNOSIS — G8929 Other chronic pain: Secondary | ICD-10-CM | POA: Diagnosis not present

## 2017-08-08 DIAGNOSIS — K5909 Other constipation: Secondary | ICD-10-CM

## 2017-08-08 DIAGNOSIS — G8111 Spastic hemiplegia affecting right dominant side: Secondary | ICD-10-CM

## 2017-08-08 DIAGNOSIS — N183 Chronic kidney disease, stage 3 unspecified: Secondary | ICD-10-CM

## 2017-08-08 DIAGNOSIS — I6340 Cerebral infarction due to embolism of unspecified cerebral artery: Secondary | ICD-10-CM | POA: Diagnosis not present

## 2017-08-08 DIAGNOSIS — M25511 Pain in right shoulder: Secondary | ICD-10-CM | POA: Diagnosis not present

## 2017-08-08 NOTE — Progress Notes (Signed)
Location:   [facility] Room Number: 221 A Place of Service:  SNF (31)   CODE STATUS: Full Code (Most form effective date 06/12/17)  No Known Allergies  Chief Complaint  Patient presents with  . Medical Management of Chronic Issues    Cva; ckd stage 3; constipation.     HPI:  He is a 62 year old long term resident of this facility being seen for the management of his chronic illnesses: constipation; stage 3 ckd; cva. He does have chronic right shoulder pain from his cva; which is being managed. He has had a right shoulder injection. He denies any constipation; no change in appetite. There are no nursing concerns at this time.   Past Medical History:  Diagnosis Date  . Depression   . Hx of pleural effusion   . Hyperlipidemia   . Hypertension     Past Surgical History:  Procedure Laterality Date  . PATENT FORAMEN OVALE(PFO) CLOSURE N/A 10/23/2016   Procedure: Patent Forament Ovale(PFO) Closure;  Surgeon: Yates Decamp, MD;  Location: [facility] INVASIVE CV LAB;  Service: Cardiovascular;  Laterality: N/A;  . TEE WITHOUT CARDIOVERSION N/A 10/19/2016   Procedure: TRANSESOPHAGEAL ECHOCARDIOGRAM (TEE);  Surgeon: Yates Decamp, MD;  Location: [facility] ENDOSCOPY;  Service: Cardiovascular;  Laterality: N/A;  . THORACENTESIS    . ULTRASOUND GUIDANCE FOR VASCULAR ACCESS  10/23/2016   Procedure: Ultrasound Guidance For Vascular Access;  Surgeon: Yates Decamp, MD;  Location: [facility] INVASIVE CV LAB;  Service: Cardiovascular;;    Social History   Socioeconomic History  . Marital status: Legally Separated    Spouse name: Not on file  . Number of children: Not on file  . Years of education: Not on file  . Highest education level: Not on file  Occupational History  . Not on file  Social Needs  . Financial resource strain: Not on file  . Food insecurity:    Worry: Not on file    Inability: Not on file  . Transportation needs:    Medical: Not on file    Non-medical: Not on file  Tobacco Use    . Smoking status: Never Smoker  . Smokeless tobacco: Never Used  Substance and Sexual Activity  . Alcohol use: Yes  . Drug use: No  . Sexual activity: Never  Lifestyle  . Physical activity:    Days per week: Not on file    Minutes per session: Not on file  . Stress: Not on file  Relationships  . Social connections:    Talks on phone: Not on file    Gets together: Not on file    Attends religious service: Not on file    Active member of club or organization: Not on file    Attends meetings of clubs or organizations: Not on file    Relationship status: Not on file  . Intimate partner violence:    Fear of current or ex partner: Not on file    Emotionally abused: Not on file    Physically abused: Not on file    Forced sexual activity: Not on file  Other Topics Concern  . Not on file  Social History Narrative  . Not on file   History reviewed. No pertinent family history.    VITAL SIGNS BP 118/66   Pulse 69   Temp 98 F (36.7 C)   Resp 20   Ht 5\' 11"  (1.803 m)   Wt 211 lb 9.6 oz (96 kg)   SpO2 98%  BMI 29.51 kg/m   Outpatient Encounter Medications as of 08/08/2017  Medication Sig  . acetaminophen (TYLENOL) 650 MG CR tablet Take 650 mg by mouth 3 (three) times daily.   Marland Kitchen. amLODipine (NORVASC) 10 MG tablet Take 10 mg by mouth daily.   Marland Kitchen. aspirin EC 81 MG tablet Take 81 mg by mouth daily.  Marland Kitchen. atorvastatin (LIPITOR) 40 MG tablet Take 1 tablet (40 mg total) by mouth daily at 6 PM.  . clopidogrel (PLAVIX) 75 MG tablet Take 1 tablet (75 mg total) by mouth daily with breakfast.  . diphenhydrAMINE (BENADRYL) 25 mg capsule Take 25 mg by mouth every 6 (six) hours as needed for allergies.  . hydrALAZINE (APRESOLINE) 25 MG tablet Take 25 mg by mouth 2 (two) times daily.  Marland Kitchen. HYDROcodone-acetaminophen (NORCO/VICODIN) 5-325 MG tablet Take 1 tablet by mouth every 6 (six) hours as needed for moderate pain.  . Lidocaine 4 % PTCH Apply to right shoulder every 12 hours  . metoprolol  succinate (TOPROL-XL) 25 MG 24 hr tablet Take 25 mg by mouth 2 (two) times daily.  . ondansetron (ZOFRAN) 4 MG tablet Take 4 mg by mouth every 8 (eight) hours as needed for nausea or vomiting.  . senna-docusate (SENOKOT-S) 8.6-50 MG tablet Take 1 tablet by mouth 2 (two) times daily as needed for mild constipation.   Marland Kitchen. tiZANidine (ZANAFLEX) 4 MG tablet Take 4 mg by mouth 4 (four) times daily.   . traMADol (ULTRAM) 50 MG tablet Give 1 tablet by mouth two times daily for moderate to severe pain and give 2 tablets by mouth at bedtime   No facility-administered encounter medications on file as of 08/08/2017.      SIGNIFICANT DIAGNOSTIC EXAMS  PREVIOUS:   10-17-16: ct head: 1. No acute hemorrhage or definite acute infarct. 2. Advanced chronic small vessel ischemia which could easily obscure an acute white matter or deep gray nucleus infarct. 3. Small remote appearing right frontal cortex infarct.  10-17-16: chest x-ray: No active cardiopulmonary disease.   10-17-16: MRI head; MRA head and neck:  MRI HEAD IMPRESSION:  1. Multifocal acute to early subacute ischemic infarcts involving the bilateral cerebral and left cerebellar hemispheres as above, most prominent of which is a 2 cm infarct at the posterior left lentiform nucleus/corona radiata. No associated hemorrhage or mass effect. Possible central thromboembolic etiology suspected given the various vascular distributions involved. 2. Generalized age-related cerebral atrophy with advanced chronic microvascular ischemic disease. MRA HEAD IMPRESSION: 1. Negative intracranial MRA. No large or proximal arterial branch occlusion. No high-grade or correctable stenosis. 2. Question short-segment moderate to severe bilateral A2 and proximal left M2 stenoses, somewhat limited in evaluation due to extensive motion artifact on this exam. MRA NECK IMPRESSION: 1. Widely patent carotid artery systems bilaterally without high-grade or critical flow limiting  stenosis. 2. Short-segment moderate proximal right V1 stenosis. Vertebral arteries otherwise widely patent within the neck. 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  10-18-16: carotid doppler:  - The vertebral arteries appear patent with antegrade flow. - Findings consistent with a 1- 39 percent stenosis involving the right internal carotid artery and the left internal carotid artery.  10-18-16: right ankle x-ray: No acute osseous abnormality.  10-19-16: TEE:  - Left ventricle: Systolic function was normal. Stumpp motion was normal; there were no regional [place] motion abnormalities. - Mitral valve: There is mild redundant chordae with systolic anterior motion of the chordae without LVOT obstruction. Trivial prolapse, involving the anterior leaflet. No  significant myxomatous degeneration. There was trivial regurgitation. - Left atrium: No evidence of thrombus in the atrial cavity or appendage. - Right atrium: No evidence of thrombus in the atrial cavity or appendage. - Atrial septum: There was a very large patent foramen ovale. Agitated saline contrast study showed a large right-to-left atrial level shunt, in the baseline state. Agitated saline contrast study showed a large right-to-left atrial level shunt, following an increase in RA pressure induced by abdominal compression. - Impressions: A Very large PFO is probable source of the paradoxical embolic stroke.  10-20-16: bilateral lower extremity doppler: No evidence of deep vein or superficial thrombosis involving the right lower extremity and left lower extremity.  10-20-16: myoview:  1. No reversible ischemia or infarction. 2. LEFT ventricle dilatation and global hypokinesia. 3. Left ventricular ejection fraction 43% 4. Non invasive risk stratification*: Intermediate (classification due to low ejection fraction)  10-23-16: patent foramen ovale closure    02-18-17: right humerus x-ray: No acute  osseous abnormality.  NO NEW EXAMS     LABS REVIEWED: PREVIOUS:  10-17-16: wbc 12.3; hgb 17.8; hct 48.4; mcv 94.2; plt 257; glucose 101; bun 48; creat 1.80; k+ 3.4; na++ 142; ca 9.2; total bili 1.4; ast 59; albumin 3.7 10-18-16: wbc 9.3; hgb 15.9; hct 45.2; mcv 96.2; plt 219; glucose 97; bun 33; creat 1.56; k+ 3.5; na++ 143; ca 8.7; hgb a1c 4.7; chol 177; ldl 111; trig 232; hdl 20 1-61-09: wbc 9.3; hgb 17.0; hct 48.6; mcv 97.4; plt 157; glucose 98; bun 26; creat 1.61; k+ 4.2; na++ 136; ca 8.8  12-28-16: wbc 9.2; hgb 14.1; hct 40.8; mcv 98.9; plt 196; glucose 94; bun 20.9; create 1.64; k+ 4.9; na++ 141; ca 9.1; liver normal albumin 3.9 04-17-17: glucose 111; bun 18.9; creat 1.37; k+ 3.9; na++ 138; liver normal albumin 4.0; chol 77; ldl 20; trig 163; hdl 24; psa 2.93    NO NEW LABS.     Review of Systems  Constitutional: Negative for malaise/fatigue.  Respiratory: Negative for cough and shortness of breath.   Cardiovascular: Negative for chest pain, palpitations and leg swelling.  Gastrointestinal: Negative for abdominal pain, constipation and heartburn.  Musculoskeletal: Positive for joint pain. Negative for back pain and myalgias.       Chronic right shoulder pain   Skin: Negative.   Neurological: Negative for dizziness.  Psychiatric/Behavioral: The patient is not nervous/anxious.      Physical Exam  Constitutional: He is oriented to person, place, and time. He appears well-developed and well-nourished. No distress.  Neck: No thyromegaly present.  Cardiovascular: Normal rate, regular rhythm and intact distal pulses.  Murmur heard. 2/6  Pulmonary/Chest: Effort normal and breath sounds normal. No respiratory distress.  Abdominal: Soft. Bowel sounds are normal. He exhibits no distension. There is no tenderness.  Musculoskeletal: He exhibits no edema.  Right hemiplegia   Lymphadenopathy:    He has no cervical adenopathy.  Neurological: He is alert and oriented to person, place, and  time.  Skin: Skin is warm and dry. He is not diaphoretic.  Psychiatric: He has a normal mood and affect.    ASSESSMENT/ PLAN:  TODAY:   1.  CVA, embolic has spastic right hemiplegia/chronic right shoulder pain: stable is taking tylenol 650 mg three times daily; zanaflex 4 mg four times daily; ultram 50 mg every 8 hours  uses lidoderm 4% patch to right shoulder; will continue asa 81 mg daily and plavix 75 mg daily   2. Constipation: stable will continue senna s twice  daily   3. Chronic renal disease stage III: without change: bun 18.9; creat 1.37   PREVIOUS:   4. Essential hypertension: stable b/p 118/66 is stable  will continue  hydralazine 25 mg twice daily and toprol xl  25 mg twice daily norvasc  10 mg daily and will monitor   5. Hyperlipidemia LDL goal <70: stable ldl 20 will continue lipitor 40 mg daily     MD is aware of resident's narcotic use and is in agreement with current plan of care. We will attempt to wean resident as apropriate   Synthia Innocent NP [facility]  Contact [phone] Monday through Friday 8am- 5pm  After hours call [phone]

## 2017-08-15 DIAGNOSIS — I10 Essential (primary) hypertension: Secondary | ICD-10-CM | POA: Diagnosis not present

## 2017-08-21 ENCOUNTER — Other Ambulatory Visit: Payer: Self-pay

## 2017-08-21 MED ORDER — TRAMADOL HCL 50 MG PO TABS: ORAL_TABLET | ORAL | 0 refills | Status: DC

## 2017-08-21 NOTE — Telephone Encounter (Signed)
Rx faxed to [facility] (P) [phone], (F) [phone] 

## 2017-08-22 DIAGNOSIS — I10 Essential (primary) hypertension: Secondary | ICD-10-CM | POA: Diagnosis not present

## 2017-08-29 DIAGNOSIS — I10 Essential (primary) hypertension: Secondary | ICD-10-CM | POA: Diagnosis not present

## 2017-09-09 ENCOUNTER — Other Ambulatory Visit: Payer: Self-pay

## 2017-09-09 MED ORDER — TRAMADOL HCL 50 MG PO TABS: ORAL_TABLET | ORAL | 0 refills | Status: AC

## 2017-09-09 NOTE — Telephone Encounter (Signed)
Rx faxed to [facility] (P) [phone], (F) [phone] 

## 2017-09-12 ENCOUNTER — Non-Acute Institutional Stay (SKILLED_NURSING_FACILITY): Payer: Medicaid Other | Admitting: Adult Health

## 2017-09-12 ENCOUNTER — Encounter: Payer: Self-pay | Admitting: Adult Health

## 2017-09-12 DIAGNOSIS — E785 Hyperlipidemia, unspecified: Secondary | ICD-10-CM | POA: Diagnosis not present

## 2017-09-12 DIAGNOSIS — N183 Chronic kidney disease, stage 3 unspecified: Secondary | ICD-10-CM

## 2017-09-12 DIAGNOSIS — I10 Essential (primary) hypertension: Secondary | ICD-10-CM

## 2017-09-12 NOTE — Progress Notes (Signed)
Location:   [facility] Room Number: [id number] Place of Service:  SNF ([id number])   CODE STATUS: Full Code (Most form effective date 06/12/17)  No Known Allergies  Chief Complaint  Patient presents with  . Medical Management of Chronic Issues    Hypertension; ckd stage 3; hyperlipidemia     HPI:  He is a 62 year old long term resident of this facility being seen for the management of his chronic illnesses: hypertension; ck stage 3 and hyperlipidemia. He denies any right shoulder pain at this time; no insomnia; no change in appetite. There are no nursing concerns at this time.   Past Medical History:  Diagnosis Date  . CVA (cerebral vascular accident) (HCC) 10/17/2016  . Depression   . Hx of pleural effusion   . Hyperlipidemia   . Hypertension     Past Surgical History:  Procedure Laterality Date  . PATENT FORAMEN OVALE(PFO) CLOSURE N/A 10/23/2016   Procedure: Patent Forament [place](PFO) Closure;  Surgeon: Yates DecampGanji, Jay, MD;  Location: [facility] INVASIVE CV LAB;  Service: Cardiovascular;  Laterality: N/A;  . TEE WITHOUT CARDIOVERSION N/A 10/19/2016   Procedure: TRANSESOPHAGEAL ECHOCARDIOGRAM (TEE);  Surgeon: Yates DecampGanji, Jay, MD;  [facility];  Service: Cardiovascular;  Laterality: N/A;  . THORACENTESIS    . ULTRASOUND GUIDANCE FOR VASCULAR ACCESS  10/23/2016   Procedure: Ultrasound Guidance For Vascular Access;  Surgeon: Yates DecampGanji, Jay, MD;  [facility];  Service: Cardiovascular;;    Social History   Socioeconomic History  . Marital status: Legally Separated    Spouse name: Not on file  . Number of children: Not on file  . Years of education: Not on file  . Highest education level: Not on file  Occupational History  . Not on file  Social Needs  . Financial resource strain: Not on file  . Food insecurity:    Worry: Not on file    Inability: Not on file  . Transportation needs:    Medical: Not on file    Non-medical: Not on file  Tobacco Use  . Smoking  status: Never Smoker  . Smokeless tobacco: Never Used  Substance and Sexual Activity  . Alcohol use: Yes  . Drug use: No  . Sexual activity: Never  Lifestyle  . Physical activity:    Days per week: Not on file    Minutes per session: Not on file  . Stress: Not on file  Relationships  . Social connections:    Talks on phone: Not on file    Gets together: Not on file    Attends religious service: Not on file    Active member of club or organization: Not on file    Attends meetings of clubs or organizations: Not on file    Relationship status: Not on file  . Intimate partner violence:    Fear of current or ex partner: Not on file    Emotionally abused: Not on file    Physically abused: Not on file    Forced sexual activity: Not on file  Other Topics Concern  . Not on file  Social History Narrative  . Not on file   History reviewed. No pertinent family history.    VITAL SIGNS BP 124/80   Pulse 60   Temp 98 F (36.7 C)   Resp 20   Ht 5\' 11"  (1.803 m)   Wt 209 lb 14.4 oz (95.2 kg)   SpO2 98%   BMI 29.28 kg/m  Outpatient Encounter Medications as of 09/12/2017  Medication Sig  . acetaminophen (TYLENOL) 650 MG CR tablet Take 650 mg by mouth 3 (three) times daily.   Marland Kitchen. amLODipine (NORVASC) 10 MG tablet Take 10 mg by mouth daily.   Marland Kitchen. aspirin EC 81 MG tablet Take 81 mg by mouth daily.  Marland Kitchen. atorvastatin (LIPITOR) 40 MG tablet Take 1 tablet (40 mg total) by mouth daily at 6 PM.  . clopidogrel (PLAVIX) 75 MG tablet Take 1 tablet (75 mg total) by mouth daily with breakfast.  . diphenhydrAMINE (BENADRYL) 25 mg capsule Take 25 mg by mouth every 6 (six) hours as needed for allergies.  . hydrALAZINE (APRESOLINE) 25 MG tablet Take 25 mg by mouth 2 (two) times daily.  Marland Kitchen. HYDROcodone-acetaminophen (NORCO/VICODIN) 5-325 MG tablet Take 1 tablet by mouth every 6 (six) hours as needed for moderate pain.  . Lidocaine 4 % PTCH Apply to right shoulder every 12 hours  . metoprolol succinate  (TOPROL-XL) 25 MG 24 hr tablet Take 25 mg by mouth 2 (two) times daily.  . Nutritional Supplements (NUTRITIONAL SUPPLEMENT PO) Low Concentrated Sweets Diet - Regular texture, Regular / Thin consistency  . ondansetron (ZOFRAN) 4 MG tablet Take 4 mg by mouth every 8 (eight) hours as needed for nausea or vomiting.  . senna-docusate (SENOKOT-S) 8.6-50 MG tablet Take 1 tablet by mouth 2 (two) times daily as needed for mild constipation.   Marland Kitchen. tiZANidine (ZANAFLEX) 4 MG tablet Take 4 mg by mouth 4 (four) times daily.   . traMADol (ULTRAM) 50 MG tablet Give 1 tablet by mouth two times daily for moderate to severe pain and give 2 tablets by mouth at bedtime   No facility-administered encounter medications on file as of 09/12/2017.      SIGNIFICANT DIAGNOSTIC EXAMS  PREVIOUS:   10-17-16: ct head: 1. No acute hemorrhage or definite acute infarct. 2. Advanced chronic small vessel ischemia which could easily obscure an acute white matter or deep gray nucleus infarct. 3. Small remote appearing right frontal cortex infarct.  10-17-16: chest x-ray: No active cardiopulmonary disease.   10-17-16: MRI head; MRA head and neck:  MRI HEAD IMPRESSION:  1. Multifocal acute to early subacute ischemic infarcts involving the bilateral cerebral and left cerebellar hemispheres as above, most prominent of which is a 2 cm infarct at the posterior left lentiform nucleus/corona radiata. No associated hemorrhage or mass effect. Possible central thromboembolic etiology suspected given the various vascular distributions involved. 2. Generalized age-related cerebral atrophy with advanced chronic microvascular ischemic disease. MRA HEAD IMPRESSION: 1. Negative intracranial MRA. No large or proximal arterial branch occlusion. No high-grade or correctable stenosis. 2. Question short-segment moderate to severe bilateral A2 and proximal left M2 stenoses, somewhat limited in evaluation due to extensive motion artifact on this  exam. MRA NECK IMPRESSION: 1. Widely patent carotid artery systems bilaterally without high-grade or critical flow limiting stenosis. 2. Short-segment moderate proximal right V1 stenosis. Vertebral arteries otherwise widely patent within the neck. 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  10-18-16: carotid doppler:  - The vertebral arteries appear patent with antegrade flow. - Findings consistent with a 1- 39 percent stenosis involving the right internal carotid artery and the left internal carotid artery.  10-18-16: right ankle x-ray: No acute osseous abnormality.  10-19-16: TEE:  - Left ventricle: Systolic function was normal. [place] motion was normal; there were no regional [place] motion abnormalities. - Mitral valve: There is mild redundant chordae with systolic anterior motion  of the chordae without LVOT obstruction. Trivial prolapse, involving the anterior leaflet. No significant myxomatous degeneration. There was trivial regurgitation. - Left atrium: No evidence of thrombus in the atrial cavity or appendage. - Right atrium: No evidence of thrombus in the atrial cavity or appendage. - Atrial septum: There was a very large patent foramen ovale. Agitated saline contrast study showed a large right-to-left atrial level shunt, in the baseline state. Agitated saline contrast study showed a large right-to-left atrial level shunt, following an increase in RA pressure induced by abdominal compression. - Impressions: A Very large PFO is probable source of the paradoxical embolic stroke.  10-20-16: bilateral lower extremity doppler: No evidence of deep vein or superficial thrombosis involving the right lower extremity and left lower extremity.  10-20-16: myoview:  1. No reversible ischemia or infarction. 2. LEFT ventricle dilatation and global hypokinesia. 3. Left ventricular ejection fraction 43% 4. Non invasive risk stratification*: Intermediate  (classification due to low ejection fraction)  10-23-16: patent foramen ovale closure    02-18-17: right humerus x-ray: No acute osseous abnormality.  NO NEW EXAMS     LABS REVIEWED: PREVIOUS:  10-17-16: wbc 12.3; hgb 17.8; hct 48.4; mcv 94.2; plt 257; glucose 101; bun 48; creat 1.80; k+ 3.4; na++ 142; ca 9.2; total bili 1.4; ast 59; albumin 3.7 10-18-16: wbc 9.3; hgb 15.9; hct 45.2; mcv 96.2; plt 219; glucose 97; bun 33; creat 1.56; k+ 3.5; na++ 143; ca 8.7; hgb a1c 4.7; chol 177; ldl 111; trig 232; hdl 20 [birth year]79: wbc 9.3; hgb 17.0; hct 48.6; mcv 97.4; plt 157; glucose 98; bun 26; creat 1.61; k+ 4.2; na++ 136; ca 8.8  12-28-16: wbc 9.2; hgb 14.1; hct 40.8; mcv 98.9; plt 196; glucose 94; bun 20.9; create 1.64; k+ 4.9; na++ 141; ca 9.1; liver normal albumin 3.9 04-17-17: glucose 111; bun 18.9; creat 1.37; k+ 3.9; na++ 138; liver normal albumin 4.0; chol 77; ldl 20; trig 163; hdl 24; psa 2.93    NO NEW LABS.     Review of Systems  Constitutional: Negative for malaise/fatigue.  Respiratory: Negative for cough and shortness of breath.   Cardiovascular: Negative for chest pain, palpitations and leg swelling.  Gastrointestinal: Negative for abdominal pain, constipation and heartburn. Blood in stool: ys.  Musculoskeletal: Positive for joint pain. Negative for back pain and myalgias.       Shoulder pain is under control   Skin: Negative.   Neurological: Negative for dizziness.  Psychiatric/Behavioral: The patient is not nervous/anxious.      Physical Exam  Constitutional: He is oriented to person, place, and time. He appears well-developed and well-nourished. No distress.  Neck: No thyromegaly present.  Cardiovascular: Normal rate, regular rhythm and intact distal pulses.  Murmur heard. 2/6  Pulmonary/Chest: Effort normal and breath sounds normal. No respiratory distress.  Abdominal: Soft. Bowel sounds are normal. He exhibits no distension. There is no tenderness.  Musculoskeletal: He  exhibits no edema.  Right hemiplegia    Lymphadenopathy:    He has no cervical adenopathy.  Neurological: He is alert and oriented to person, place, and time.  Skin: Skin is warm and dry. He is not diaphoretic.  Psychiatric: He has a normal mood and affect.      ASSESSMENT/ PLAN:  TODAY:   1. Chronic renal disease stage III: without change: bun 18.9; creat 1.37  2. Essential hypertension: stable b/p 124/80 is stable  will continue  hydralazine 25 mg twice daily and toprol xl  25 mg twice daily norvasc  10 mg daily and will monitor   3. Hyperlipidemia LDL goal <70: stable ldl 20 will continue lipitor 40 mg daily  PREVIOUS:   4.  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 Constipation: stable will continue senna s twice daily as needed  MD is aware of resident's narcotic use and is in agreement with current plan of care. We will attempt to wean resident as apropriate   Synthia Innocent NP [facility]  Contact [phone] Monday through Friday 8am- 5pm  After hours call [phone]

## 2017-09-18 ENCOUNTER — Encounter: Payer: Self-pay | Admitting: Internal Medicine

## 2017-09-19 DIAGNOSIS — I10 Essential (primary) hypertension: Secondary | ICD-10-CM | POA: Diagnosis not present

## 2017-09-26 DIAGNOSIS — L603 Nail dystrophy: Secondary | ICD-10-CM | POA: Diagnosis not present

## 2017-09-26 DIAGNOSIS — I739 Peripheral vascular disease, unspecified: Secondary | ICD-10-CM | POA: Diagnosis not present

## 2017-09-26 DIAGNOSIS — I10 Essential (primary) hypertension: Secondary | ICD-10-CM | POA: Diagnosis not present

## 2017-09-29 DIAGNOSIS — I10 Essential (primary) hypertension: Secondary | ICD-10-CM | POA: Diagnosis not present

## 2017-10-04 DIAGNOSIS — I10 Essential (primary) hypertension: Secondary | ICD-10-CM | POA: Diagnosis not present

## 2017-10-10 DIAGNOSIS — I10 Essential (primary) hypertension: Secondary | ICD-10-CM | POA: Diagnosis not present

## 2017-10-17 DIAGNOSIS — I10 Essential (primary) hypertension: Secondary | ICD-10-CM | POA: Diagnosis not present

## 2017-10-18 ENCOUNTER — Non-Acute Institutional Stay (SKILLED_NURSING_FACILITY): Payer: Medicaid Other | Admitting: Adult Health

## 2017-10-18 ENCOUNTER — Encounter: Payer: Self-pay | Admitting: Adult Health

## 2017-10-18 DIAGNOSIS — I6340 Cerebral infarction due to embolism of unspecified cerebral artery: Secondary | ICD-10-CM | POA: Diagnosis not present

## 2017-10-18 DIAGNOSIS — K5909 Other constipation: Secondary | ICD-10-CM

## 2017-10-18 DIAGNOSIS — G8111 Spastic hemiplegia affecting right dominant side: Secondary | ICD-10-CM

## 2017-10-18 DIAGNOSIS — E785 Hyperlipidemia, unspecified: Secondary | ICD-10-CM | POA: Diagnosis not present

## 2017-10-24 DIAGNOSIS — I10 Essential (primary) hypertension: Secondary | ICD-10-CM | POA: Diagnosis not present

## 2017-10-26 ENCOUNTER — Encounter: Payer: Self-pay | Admitting: Adult Health

## 2017-10-26 NOTE — Progress Notes (Signed)
Location:   [name] Room Number: [id number] Place of Service:  SNF (31)   CODE STATUS: full code   No Known Allergies  Chief Complaint  Patient presents with  . Medical Management of Chronic Issues    Cva; constipation; hyperlipidemia     HPI:  He is a 62 year old long term resident of this facility being seen for the management of his chronic illnesses: cva; constipation ;hyperlipidemia. He denies any constipation; no changes in appetite; no insomnia. There are no nursing concerns at this time.   Past Medical History:  Diagnosis Date  . CVA (cerebral vascular accident) (HCC) 10/17/2016  . Depression   . Hx of pleural effusion   . Hyperlipidemia   . Hypertension     Past Surgical History:  Procedure Laterality Date  . PATENT FORAMEN OVALE(PFO) CLOSURE N/A 10/23/2016   Procedure: Patent Forament [place](PFO) Closure;  Surgeon: Yates Decamp, MD;  Location: [facility] INVASIVE CV LAB;  Service: Cardiovascular;  Laterality: N/A;  . TEE WITHOUT CARDIOVERSION N/A 10/19/2016   Procedure: TRANSESOPHAGEAL ECHOCARDIOGRAM (TEE);  Surgeon: Yates Decamp, MD;  Location: [facility] ENDOSCOPY;  Service: Cardiovascular;  Laterality: N/A;  . THORACENTESIS    . ULTRASOUND GUIDANCE FOR VASCULAR ACCESS  10/23/2016   Procedure: Ultrasound Guidance For Vascular Access;  Surgeon: Yates Decamp, MD;  Location: [facility] INVASIVE CV LAB;  Service: Cardiovascular;;    Social History   Socioeconomic History  . Marital status: Legally Separated    Spouse name: Not on file  . Number of children: Not on file  . Years of education: Not on file  . Highest education level: Not on file  Occupational History  . Not on file  Social Needs  . Financial resource strain: Not on file  . Food insecurity:    Worry: Not on file    Inability: Not on file  . Transportation needs:    Medical: Not on file    Non-medical: Not on file  Tobacco Use  . Smoking status: Never Smoker  . Smokeless tobacco: Never Used  Substance  and Sexual Activity  . Alcohol use: Yes  . Drug use: No  . Sexual activity: Never  Lifestyle  . Physical activity:    Days per week: Not on file    Minutes per session: Not on file  . Stress: Not on file  Relationships  . Social connections:    Talks on phone: Not on file    Gets together: Not on file    Attends religious service: Not on file    Active member of club or organization: Not on file    Attends meetings of clubs or organizations: Not on file    Relationship status: Not on file  . Intimate partner violence:    Fear of current or ex partner: Not on file    Emotionally abused: Not on file    Physically abused: Not on file    Forced sexual activity: Not on file  Other Topics Concern  . Not on file  Social History Narrative  . Not on file   History reviewed. No pertinent family history.    VITAL SIGNS BP (!) 148/76   Pulse 60   Temp 98.1 F (36.7 C)   Resp 16   Ht 5\' 11"  (1.803 m)   Wt 213 lb (96.6 kg)   SpO2 97%   BMI 29.71 kg/m   Outpatient Encounter Medications as of 10/18/2017  Medication Sig  . acetaminophen (TYLENOL) 650  MG CR tablet Take 650 mg by mouth 3 (three) times daily.   Marland Kitchen amLODipine (NORVASC) 10 MG tablet Take 10 mg by mouth daily.   Marland Kitchen aspirin EC 81 MG tablet Take 81 mg by mouth daily.  Marland Kitchen atorvastatin (LIPITOR) 40 MG tablet Take 1 tablet (40 mg total) by mouth daily at 6 PM.  . clopidogrel (PLAVIX) 75 MG tablet Take 1 tablet (75 mg total) by mouth daily with breakfast.  . diphenhydrAMINE (BENADRYL) 25 mg capsule Take 25 mg by mouth every 6 (six) hours as needed for allergies.  . hydrALAZINE (APRESOLINE) 25 MG tablet Take 25 mg by mouth 2 (two) times daily.  Marland Kitchen HYDROcodone-acetaminophen (NORCO/VICODIN) 5-325 MG tablet Take 1 tablet by mouth every 6 (six) hours as needed for moderate pain.  . Lidocaine 4 % PTCH Apply to right shoulder every 12 hours  . metoprolol succinate (TOPROL-XL) 25 MG 24 hr tablet Take 25 mg by mouth 2 (two) times daily.    . Nutritional Supplements (NUTRITIONAL SUPPLEMENT PO) Low Concentrated Sweets Diet - Regular texture, Regular / Thin consistency  . ondansetron (ZOFRAN) 4 MG tablet Take 4 mg by mouth every 8 (eight) hours as needed for nausea or vomiting.  . senna-docusate (SENOKOT-S) 8.6-50 MG tablet Take 1 tablet by mouth 2 (two) times daily as needed for mild constipation.   Marland Kitchen tiZANidine (ZANAFLEX) 4 MG tablet Take 4 mg by mouth 4 (four) times daily.   . traMADol (ULTRAM) 50 MG tablet Give 1 tablet by mouth two times daily for moderate to severe pain and give 2 tablets by mouth at bedtime   No facility-administered encounter medications on file as of 10/18/2017.      SIGNIFICANT DIAGNOSTIC EXAMS   PREVIOUS:   10-17-16: ct head: 1. No acute hemorrhage or definite acute infarct. 2. Advanced chronic small vessel ischemia which could easily obscure an acute white matter or deep gray nucleus infarct. 3. Small remote appearing right frontal cortex infarct.  10-17-16: chest x-ray: No active cardiopulmonary disease.   10-17-16: MRI head; MRA head and neck:  MRI HEAD IMPRESSION:  1. Multifocal acute to early subacute ischemic infarcts involving the bilateral cerebral and left cerebellar hemispheres as above, most prominent of which is a 2 cm infarct at the posterior left lentiform nucleus/corona radiata. No associated hemorrhage or mass effect. Possible central thromboembolic etiology suspected given the various vascular distributions involved. 2. Generalized age-related cerebral atrophy with advanced chronic microvascular ischemic disease. MRA HEAD IMPRESSION: 1. Negative intracranial MRA. No large or proximal arterial branch occlusion. No high-grade or correctable stenosis. 2. Question short-segment moderate to severe bilateral A2 and proximal left M2 stenoses, somewhat limited in evaluation due to extensive motion artifact on this exam. MRA NECK IMPRESSION: 1. Widely patent carotid artery systems  bilaterally without high-grade or critical flow limiting stenosis. 2. Short-segment moderate proximal right V1 stenosis. Vertebral arteries otherwise widely patent within the neck. 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  10-18-16: carotid doppler:  - The vertebral arteries appear patent with antegrade flow. - Findings consistent with a 1- 39 percent stenosis involving the right internal carotid artery and the left internal carotid artery.  10-18-16: right ankle x-ray: No acute osseous abnormality.  10-19-16: TEE:  - Left ventricle: Systolic function was normal. [place] motion was normal; there were no regional [place] motion abnormalities. - Mitral valve: There is mild redundant chordae with systolic anterior motion of the chordae without LVOT obstruction. Trivial prolapse, involving the anterior leaflet.  No significant myxomatous degeneration. There was trivial regurgitation. - Left atrium: No evidence of thrombus in the atrial cavity or appendage. - Right atrium: No evidence of thrombus in the atrial cavity or appendage. - Atrial septum: There was a very large patent foramen ovale. Agitated saline contrast study showed a large right-to-left atrial level shunt, in the baseline state. Agitated saline contrast study showed a large right-to-left atrial level shunt, following an increase in RA pressure induced by abdominal compression. - Impressions: A Very large PFO is probable source of the paradoxical embolic stroke.  10-20-16: bilateral lower extremity doppler: No evidence of deep vein or superficial thrombosis involving the right lower extremity and left lower extremity.  10-20-16: myoview:  1. No reversible ischemia or infarction. 2. LEFT ventricle dilatation and global hypokinesia. 3. Left ventricular ejection fraction 43% 4. Non invasive risk stratification*: Intermediate (classification due to low ejection fraction)  10-23-16: patent foramen ovale  closure    02-18-17: right humerus x-ray: No acute osseous abnormality.  NO NEW EXAMS     LABS REVIEWED: PREVIOUS:  10-17-16: wbc 12.3; hgb 17.8; hct 48.4; mcv 94.2; plt 257; glucose 101; bun 48; creat 1.80; k+ 3.4; na++ 142; ca 9.2; total bili 1.4; ast 59; albumin 3.7 10-18-16: wbc 9.3; hgb 15.9; hct 45.2; mcv 96.2; plt 219; glucose 97; bun 33; creat 1.56; k+ 3.5; na++ 143; ca 8.7; hgb a1c 4.7; chol 177; ldl 111; trig 232; hdl 20 5-78-46: wbc 9.3; hgb 17.0; hct 48.6; mcv 97.4; plt 157; glucose 98; bun 26; creat 1.61; k+ 4.2; na++ 136; ca 8.8  12-28-16: wbc 9.2; hgb 14.1; hct 40.8; mcv 98.9; plt 196; glucose 94; bun 20.9; create 1.64; k+ 4.9; na++ 141; ca 9.1; liver normal albumin 3.9 04-17-17: glucose 111; bun 18.9; creat 1.37; k+ 3.9; na++ 138; liver normal albumin 4.0; chol 77; ldl 20; trig 163; hdl 24; psa 2.93    NO NEW LABS.   Review of Systems  Constitutional: Negative for malaise/fatigue.  Respiratory: Negative for cough and shortness of breath.   Cardiovascular: Negative for chest pain, palpitations and leg swelling.  Gastrointestinal: Negative for abdominal pain, constipation and heartburn.  Musculoskeletal: Negative for back pain, joint pain and myalgias.  Skin: Negative.   Neurological: Negative for dizziness.  Psychiatric/Behavioral: The patient is not nervous/anxious.    Physical Exam  Constitutional: He is oriented to person, place, and time. He appears well-developed and well-nourished. No distress.  Neck: No thyromegaly present.  Cardiovascular: Normal rate, regular rhythm and intact distal pulses.  Murmur heard. 2/6  Pulmonary/Chest: Effort normal and breath sounds normal. No respiratory distress.  Abdominal: Soft. Bowel sounds are normal. He exhibits no distension. There is no tenderness.  Musculoskeletal: He exhibits no edema.  Right hemiplegia Is able to move other extremities   Lymphadenopathy:    He has no cervical adenopathy.  Neurological: He is alert and  oriented to person, place, and time.  Skin: Skin is warm and dry. He is not diaphoretic.  Psychiatric: He has a normal mood and affect.    ASSESSMENT/ PLAN:  TODAY:   1. Hyperlipidemia LDL goal <70: stable ldl 20 will continue lipitor 40 mg daily  2.  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 Constipation: stable will continue senna s twice daily as needed  PREVIOUS:   4. Chronic renal disease stage III: without change: bun 18.9; creat 1.37  5. Essential hypertension: stable b/p 148/76 is stable  will continue  hydralazine 25 mg twice daily and toprol xl  25 mg twice daily norvasc  10 mg daily and will monitor      MD is aware of resident's narcotic use and is in agreement with current plan of care. We will attempt to wean resident as apropriate   Synthia Innocent NP [facility]  Contact [phone] Monday through Friday 8am- 5pm  After hours call [phone]

## 2017-10-29 DIAGNOSIS — I10 Essential (primary) hypertension: Secondary | ICD-10-CM | POA: Diagnosis not present

## 2017-11-07 DIAGNOSIS — I10 Essential (primary) hypertension: Secondary | ICD-10-CM | POA: Diagnosis not present

## 2017-11-13 DIAGNOSIS — I10 Essential (primary) hypertension: Secondary | ICD-10-CM | POA: Diagnosis not present

## 2017-11-15 ENCOUNTER — Non-Acute Institutional Stay (SKILLED_NURSING_FACILITY): Payer: Medicaid Other | Admitting: Adult Health

## 2017-11-15 ENCOUNTER — Encounter: Payer: Self-pay | Admitting: Adult Health

## 2017-11-15 DIAGNOSIS — E785 Hyperlipidemia, unspecified: Secondary | ICD-10-CM

## 2017-11-15 DIAGNOSIS — N183 Chronic kidney disease, stage 3 unspecified: Secondary | ICD-10-CM

## 2017-11-15 DIAGNOSIS — I6340 Cerebral infarction due to embolism of unspecified cerebral artery: Secondary | ICD-10-CM

## 2017-11-15 DIAGNOSIS — M25511 Pain in right shoulder: Secondary | ICD-10-CM

## 2017-11-15 DIAGNOSIS — G8111 Spastic hemiplegia affecting right dominant side: Secondary | ICD-10-CM | POA: Diagnosis not present

## 2017-11-15 DIAGNOSIS — G8929 Other chronic pain: Secondary | ICD-10-CM | POA: Diagnosis not present

## 2017-11-15 DIAGNOSIS — I10 Essential (primary) hypertension: Secondary | ICD-10-CM | POA: Diagnosis not present

## 2017-11-15 DIAGNOSIS — K5909 Other constipation: Secondary | ICD-10-CM

## 2017-11-15 NOTE — Progress Notes (Signed)
Provider:Deborah Chilton Si NP Location  [name]  PCP: Kirt Boys, DO  Extended Emergency Contact Information Primary Emergency Contact: Arran, Fessel Address: [address]          [place] [address] [place] Home Phone: [phone] Relation: Father Secondary Emergency Contact: Colwell,Lisa Mobile Phone: [phone] Relation: Sister  Codes status: full code  Goals of care: advanced directive information Advanced Directives 09/12/2017  Does Patient Have a Medical Advance Directive? Yes  Type of Advance Directive Out of facility DNR (pink MOST or yellow form)  Does patient want to make changes to medical advance directive? No - Patient declined  Would patient like information on creating a medical advance directive? No - Patient declined  Pre-existing out of facility DNR order (yellow form or pink MOST form) Pink MOST form placed in chart (order not valid for inpatient use)     No Known Allergies  Chief Complaint  Patient presents with  . Annual Exam    HPI  He is a 62 year old long term resident of this facility being seen for his annual review. He has not been hospitalized in the past year. He has done well over this past year. He denies any chest pain; no headaches; no uncontrolled right shoulder pain. There are no nursing concerns at this time. Will continue to monitor his chronic illnesses including: cva; hypertension; chronic kidney disease.     Past Medical History:  Diagnosis Date  . CVA (cerebral vascular accident) (HCC) 10/17/2016  . Depression   . Hx of pleural effusion   . Hyperlipidemia   . Hyperlipidemia LDL goal <70 04/22/2008   Qualifier: Diagnosis of  By: Shelle Iron MD, Maree Krabbe   . Hypertension    Past Surgical History:  Procedure Laterality Date  . PATENT FORAMEN OVALE(PFO) CLOSURE N/A 10/23/2016   Procedure: Patent [place](PFO) Closure;  Surgeon: Yates Decamp, MD;  Location: [facility] INVASIVE CV LAB;  Service: Cardiovascular;   Laterality: N/A;  . TEE WITHOUT CARDIOVERSION N/A 10/19/2016   Procedure: TRANSESOPHAGEAL ECHOCARDIOGRAM (TEE);  Surgeon: Yates Decamp, MD;  Location: [facility] ENDOSCOPY;  Service: Cardiovascular;  Laterality: N/A;  . THORACENTESIS    . ULTRASOUND GUIDANCE FOR VASCULAR ACCESS  10/23/2016   Procedure: Ultrasound Guidance For Vascular Access;  Surgeon: Yates Decamp, MD;  Location: [facility] INVASIVE CV LAB;  Service: Cardiovascular;;    reports that he has never smoked. He has never used smokeless tobacco. He reports that he drinks alcohol. He reports that he does not use drugs. Social History   Tobacco Use  . Smoking status: Never Smoker  . Smokeless tobacco: Never Used  Substance Use Topics  . Alcohol use: Yes  . Drug use: No   History reviewed. No pertinent family history.  Pertinent  Health Maintenance Due  Topic Date Due  . COLONOSCOPY  02/26/2006  . INFLUENZA VACCINE  12/13/2017 (Originally 08/29/2017)   No flowsheet data found. No flowsheet data found.  Functional Status Survey:    Outpatient Encounter Medications as of 11/15/2017  Medication Sig  . acetaminophen (TYLENOL) 650 MG CR tablet Take 650 mg by mouth 3 (three) times daily.   Marland Kitchen amLODipine (NORVASC) 10 MG tablet Take 10 mg by mouth daily.   Marland Kitchen aspirin EC 81 MG tablet Take 81 mg by mouth daily.  Marland Kitchen atorvastatin (LIPITOR) 40 MG tablet Take 1 tablet (40 mg total) by mouth daily at 6 PM.  . clopidogrel (PLAVIX) 75 MG tablet Take 1 tablet (75 mg total) by mouth  daily with breakfast.  . diphenhydrAMINE (BENADRYL) 25 mg capsule Take 25 mg by mouth every 6 (six) hours as needed for allergies.  . hydrALAZINE (APRESOLINE) 25 MG tablet Take 25 mg by mouth 2 (two) times daily.  Marland Kitchen HYDROcodone-acetaminophen (NORCO/VICODIN) 5-325 MG tablet Take 1 tablet by mouth every 6 (six) hours as needed for moderate pain.  . Lidocaine 4 % PTCH Apply to right shoulder every 12 hours  . metoprolol succinate (TOPROL-XL) 25 MG 24 hr tablet Take 25 mg by mouth 2  (two) times daily.  . Nutritional Supplements (NUTRITIONAL SUPPLEMENT PO) Low Concentrated Sweets Diet - Regular texture, Regular / Thin consistency  . ondansetron (ZOFRAN) 4 MG tablet Take 4 mg by mouth every 8 (eight) hours as needed for nausea or vomiting.  . senna-docusate (SENOKOT-S) 8.6-50 MG tablet Take 1 tablet by mouth 2 (two) times daily as needed for mild constipation.   Marland Kitchen tiZANidine (ZANAFLEX) 4 MG tablet Take 4 mg by mouth 4 (four) times daily.   . traMADol (ULTRAM) 50 MG tablet Give 1 tablet by mouth two times daily for moderate to severe pain and give 2 tablets by mouth at bedtime   No facility-administered encounter medications on file as of 11/15/2017.      Vitals:   11/15/17 1115  BP: (!) 154/78  Pulse: 62  Resp: 16  Temp: 98.1 F (36.7 C)  SpO2: 97%  Weight: 213 lb (96.6 kg)  Height: 5\' 11"  (1.803 m)   Body mass index is 29.71 kg/m.  DIAGNOSTIC EXAMS    PREVIOUS:   10-17-16: ct head: 1. No acute hemorrhage or definite acute infarct. 2. Advanced chronic small vessel ischemia which could easily obscure an acute white matter or deep gray nucleus infarct. 3. Small remote appearing right frontal cortex infarct.  10-17-16: chest x-ray: No active cardiopulmonary disease.   10-17-16: MRI head; MRA head and neck:  MRI HEAD IMPRESSION:  1. Multifocal acute to early subacute ischemic infarcts involving the bilateral cerebral and left cerebellar hemispheres as above, most prominent of which is a 2 cm infarct at the posterior left lentiform nucleus/corona radiata. No associated hemorrhage or mass effect. Possible central thromboembolic etiology suspected given the various vascular distributions involved. 2. Generalized age-related cerebral atrophy with advanced chronic microvascular ischemic disease. MRA HEAD IMPRESSION: 1. Negative intracranial MRA. No large or proximal arterial branch occlusion. No high-grade or correctable stenosis. 2. Question short-segment  moderate to severe bilateral A2 and proximal left M2 stenoses, somewhat limited in evaluation due to extensive motion artifact on this exam. MRA NECK IMPRESSION: 1. Widely patent carotid artery systems bilaterally without high-grade or critical flow limiting stenosis. 2. Short-segment moderate proximal right V1 stenosis. Vertebral arteries otherwise widely patent within the neck. 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  10-18-16: carotid doppler:  - The vertebral arteries appear patent with antegrade flow. - Findings consistent with a 1- 39 percent stenosis involving the right internal carotid artery and the left internal carotid artery.  10-18-16: right ankle x-ray: No acute osseous abnormality.  10-19-16: TEE:  - Left ventricle: Systolic function was normal. Faucett motion was normal; there were no regional Baltazar motion abnormalities. - Mitral valve: There is mild redundant chordae with systolic anterior motion of the chordae without LVOT obstruction. Trivial prolapse, involving the anterior leaflet. No significant myxomatous degeneration. There was trivial regurgitation. - Left atrium: No evidence of thrombus in the atrial cavity or appendage. - Right atrium: No evidence of thrombus in the  atrial cavity or appendage. - Atrial septum: There was a very large patent foramen ovale. Agitated saline contrast study showed a large right-to-left atrial level shunt, in the baseline state. Agitated saline contrast study showed a large right-to-left atrial level shunt, following an increase in RA pressure induced by abdominal compression. - Impressions: A Very large PFO is probable source of the paradoxical embolic stroke.  10-20-16: bilateral lower extremity doppler: No evidence of deep vein or superficial thrombosis involving the right lower extremity and left lower extremity.  10-20-16: myoview:  1. No reversible ischemia or infarction. 2. LEFT ventricle  dilatation and global hypokinesia. 3. Left ventricular ejection fraction 43% 4. Non invasive risk stratification*: Intermediate (classification due to low ejection fraction)  10-23-16: patent foramen ovale closure    02-18-17: right humerus x-ray: No acute osseous abnormality.  NO NEW EXAMS     LABS REVIEWED: PREVIOUS:  12-28-16: wbc 9.2; hgb 14.1; hct 40.8; mcv 98.9; plt 196; glucose 94; bun 20.9; create 1.64; k+ 4.9; na++ 141; ca 9.1; liver normal albumin 3.9 04-17-17: glucose 111; bun 18.9; creat 1.37; k+ 3.9; na++ 138; liver normal albumin 4.0; chol 77; ldl 20; trig 163; hdl 24; psa 2.93    NO NEW LABS.   Review of Systems  Constitutional: Negative for malaise/fatigue.  Respiratory: Negative for cough and shortness of breath.   Cardiovascular: Negative for chest pain, palpitations and leg swelling.  Gastrointestinal: Negative for abdominal pain, constipation and heartburn.  Musculoskeletal: Negative for back pain, joint pain and myalgias.  Skin: Negative.   Neurological: Negative for dizziness.  Psychiatric/Behavioral: The patient is not nervous/anxious.      Physical Exam  Constitutional: He is oriented to person, place, and time. He appears well-developed and well-nourished. No distress.  Neck: No thyromegaly present.  Cardiovascular: Normal rate, regular rhythm and intact distal pulses.  Murmur heard. 2/6  Pulmonary/Chest: Effort normal and breath sounds normal. No respiratory distress.  Abdominal: Soft. Bowel sounds are normal. He exhibits no distension. There is no tenderness.  Musculoskeletal: He exhibits no edema.  Right hemiplegia Is able to move other extremities    Lymphadenopathy:    He has no cervical adenopathy.  Neurological: He is alert and oriented to person, place, and time.  Skin: Skin is warm and dry. He is not diaphoretic.  Psychiatric: He has a normal mood and affect.    ASSESSMENT/ PLAN:  TODAY:   1. Hyperlipidemia LDL goal <70: stable ldl 20  will continue lipitor 40 mg daily  2.  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 Constipation: stable will continue senna s twice daily as needed  4. Chronic renal disease stage III: without change: bun 18.9; creat 1.37  5. Essential hypertension: stable b/p 154/78 is stable  will continue  hydralazine 25 mg twice daily and toprol xl  25 mg twice daily norvasc  10 mg daily and will monitor   Will get him a colo guard Will check hepatitis C    Synthia Innocent NP [facility] [place] [facility]  Contact [phone] Monday through Friday 8am- 5pm  After hours call [phone]

## 2017-11-18 ENCOUNTER — Other Ambulatory Visit: Payer: Self-pay

## 2017-11-18 DIAGNOSIS — D649 Anemia, unspecified: Secondary | ICD-10-CM | POA: Diagnosis not present

## 2017-11-18 LAB — HM HEPATITIS C SCREENING LAB: HM HEPATITIS C SCREENING: NEGATIVE

## 2017-11-18 MED ORDER — HYDROCODONE-ACETAMINOPHEN 5-325 MG PO TABS: 1.0000 | ORAL_TABLET | Freq: Four times a day (QID) | ORAL | 0 refills | Status: AC | PRN

## 2017-11-18 NOTE — Telephone Encounter (Signed)
Rx faxed to [facility] (P) [phone], (F) [phone] 

## 2017-11-21 DIAGNOSIS — I10 Essential (primary) hypertension: Secondary | ICD-10-CM | POA: Diagnosis not present

## 2017-11-29 DIAGNOSIS — I10 Essential (primary) hypertension: Secondary | ICD-10-CM | POA: Diagnosis not present

## 2017-12-12 DIAGNOSIS — I10 Essential (primary) hypertension: Secondary | ICD-10-CM | POA: Diagnosis not present

## 2017-12-19 DIAGNOSIS — I10 Essential (primary) hypertension: Secondary | ICD-10-CM | POA: Diagnosis not present

## 2017-12-26 DIAGNOSIS — I10 Essential (primary) hypertension: Secondary | ICD-10-CM | POA: Diagnosis not present

## 2017-12-29 DIAGNOSIS — I10 Essential (primary) hypertension: Secondary | ICD-10-CM | POA: Diagnosis not present

## 2018-01-09 DIAGNOSIS — I10 Essential (primary) hypertension: Secondary | ICD-10-CM | POA: Diagnosis not present

## 2018-01-16 DIAGNOSIS — I10 Essential (primary) hypertension: Secondary | ICD-10-CM | POA: Diagnosis not present

## 2018-01-16 DIAGNOSIS — M6281 Muscle weakness (generalized): Secondary | ICD-10-CM | POA: Diagnosis not present

## 2018-01-16 DIAGNOSIS — N289 Disorder of kidney and ureter, unspecified: Secondary | ICD-10-CM | POA: Diagnosis not present

## 2018-01-16 DIAGNOSIS — E785 Hyperlipidemia, unspecified: Secondary | ICD-10-CM | POA: Diagnosis not present

## 2018-01-16 DIAGNOSIS — I69351 Hemiplegia and hemiparesis following cerebral infarction affecting right dominant side: Secondary | ICD-10-CM | POA: Diagnosis not present

## 2018-01-17 DIAGNOSIS — N289 Disorder of kidney and ureter, unspecified: Secondary | ICD-10-CM | POA: Diagnosis not present

## 2018-01-17 DIAGNOSIS — I69351 Hemiplegia and hemiparesis following cerebral infarction affecting right dominant side: Secondary | ICD-10-CM | POA: Diagnosis not present

## 2018-01-17 DIAGNOSIS — M6281 Muscle weakness (generalized): Secondary | ICD-10-CM | POA: Diagnosis not present

## 2018-01-17 DIAGNOSIS — E785 Hyperlipidemia, unspecified: Secondary | ICD-10-CM | POA: Diagnosis not present

## 2018-01-20 DIAGNOSIS — E785 Hyperlipidemia, unspecified: Secondary | ICD-10-CM | POA: Diagnosis not present

## 2018-01-20 DIAGNOSIS — E119 Type 2 diabetes mellitus without complications: Secondary | ICD-10-CM | POA: Diagnosis not present

## 2018-01-20 DIAGNOSIS — D649 Anemia, unspecified: Secondary | ICD-10-CM | POA: Diagnosis not present

## 2018-01-20 DIAGNOSIS — I10 Essential (primary) hypertension: Secondary | ICD-10-CM | POA: Diagnosis not present

## 2018-01-20 DIAGNOSIS — E039 Hypothyroidism, unspecified: Secondary | ICD-10-CM | POA: Diagnosis not present

## 2018-01-20 DIAGNOSIS — Z79899 Other long term (current) drug therapy: Secondary | ICD-10-CM | POA: Diagnosis not present

## 2018-01-23 DIAGNOSIS — I10 Essential (primary) hypertension: Secondary | ICD-10-CM | POA: Diagnosis not present

## 2018-01-29 DIAGNOSIS — I10 Essential (primary) hypertension: Secondary | ICD-10-CM | POA: Diagnosis not present

## 2018-02-13 DIAGNOSIS — I69351 Hemiplegia and hemiparesis following cerebral infarction affecting right dominant side: Secondary | ICD-10-CM | POA: Diagnosis not present

## 2018-02-13 DIAGNOSIS — E785 Hyperlipidemia, unspecified: Secondary | ICD-10-CM | POA: Diagnosis not present

## 2018-02-13 DIAGNOSIS — N289 Disorder of kidney and ureter, unspecified: Secondary | ICD-10-CM | POA: Diagnosis not present

## 2018-02-13 DIAGNOSIS — M6281 Muscle weakness (generalized): Secondary | ICD-10-CM | POA: Diagnosis not present

## 2018-02-13 DIAGNOSIS — I10 Essential (primary) hypertension: Secondary | ICD-10-CM | POA: Diagnosis not present

## 2018-02-14 DIAGNOSIS — I69351 Hemiplegia and hemiparesis following cerebral infarction affecting right dominant side: Secondary | ICD-10-CM | POA: Diagnosis not present

## 2018-02-14 DIAGNOSIS — E785 Hyperlipidemia, unspecified: Secondary | ICD-10-CM | POA: Diagnosis not present

## 2018-02-14 DIAGNOSIS — I10 Essential (primary) hypertension: Secondary | ICD-10-CM | POA: Diagnosis not present

## 2018-02-14 DIAGNOSIS — N289 Disorder of kidney and ureter, unspecified: Secondary | ICD-10-CM | POA: Diagnosis not present

## 2018-02-20 DIAGNOSIS — I10 Essential (primary) hypertension: Secondary | ICD-10-CM | POA: Diagnosis not present

## 2018-02-24 DIAGNOSIS — I10 Essential (primary) hypertension: Secondary | ICD-10-CM | POA: Diagnosis not present

## 2018-02-24 DIAGNOSIS — I639 Cerebral infarction, unspecified: Secondary | ICD-10-CM | POA: Diagnosis not present

## 2018-02-24 DIAGNOSIS — N289 Disorder of kidney and ureter, unspecified: Secondary | ICD-10-CM | POA: Diagnosis not present

## 2018-02-24 DIAGNOSIS — I69351 Hemiplegia and hemiparesis following cerebral infarction affecting right dominant side: Secondary | ICD-10-CM | POA: Diagnosis not present

## 2018-02-25 DIAGNOSIS — M25511 Pain in right shoulder: Secondary | ICD-10-CM | POA: Diagnosis not present

## 2018-02-25 DIAGNOSIS — I369 Nonrheumatic tricuspid valve disorder, unspecified: Secondary | ICD-10-CM | POA: Diagnosis not present

## 2018-02-25 DIAGNOSIS — G8190 Hemiplegia, unspecified affecting unspecified side: Secondary | ICD-10-CM | POA: Diagnosis not present

## 2018-02-25 DIAGNOSIS — I10 Essential (primary) hypertension: Secondary | ICD-10-CM | POA: Diagnosis not present

## 2018-02-25 DIAGNOSIS — M6281 Muscle weakness (generalized): Secondary | ICD-10-CM | POA: Diagnosis not present

## 2018-02-25 DIAGNOSIS — I69351 Hemiplegia and hemiparesis following cerebral infarction affecting right dominant side: Secondary | ICD-10-CM | POA: Diagnosis not present

## 2018-02-25 DIAGNOSIS — F339 Major depressive disorder, recurrent, unspecified: Secondary | ICD-10-CM | POA: Diagnosis not present

## 2018-02-25 DIAGNOSIS — R278 Other lack of coordination: Secondary | ICD-10-CM | POA: Diagnosis not present

## 2018-02-25 DIAGNOSIS — Z79899 Other long term (current) drug therapy: Secondary | ICD-10-CM | POA: Diagnosis not present

## 2018-02-25 DIAGNOSIS — R261 Paralytic gait: Secondary | ICD-10-CM | POA: Diagnosis not present

## 2018-02-25 DIAGNOSIS — I679 Cerebrovascular disease, unspecified: Secondary | ICD-10-CM | POA: Diagnosis not present

## 2018-02-26 DIAGNOSIS — I10 Essential (primary) hypertension: Secondary | ICD-10-CM | POA: Diagnosis not present

## 2018-02-27 DIAGNOSIS — I10 Essential (primary) hypertension: Secondary | ICD-10-CM | POA: Diagnosis not present

## 2018-02-27 DIAGNOSIS — Z79899 Other long term (current) drug therapy: Secondary | ICD-10-CM | POA: Diagnosis not present

## 2018-02-28 DIAGNOSIS — I10 Essential (primary) hypertension: Secondary | ICD-10-CM | POA: Diagnosis not present

## 2018-03-01 DIAGNOSIS — I10 Essential (primary) hypertension: Secondary | ICD-10-CM | POA: Diagnosis not present

## 2018-03-02 DIAGNOSIS — I10 Essential (primary) hypertension: Secondary | ICD-10-CM | POA: Diagnosis not present

## 2018-03-03 DIAGNOSIS — I10 Essential (primary) hypertension: Secondary | ICD-10-CM | POA: Diagnosis not present

## 2018-03-04 DIAGNOSIS — I10 Essential (primary) hypertension: Secondary | ICD-10-CM | POA: Diagnosis not present

## 2018-03-05 DIAGNOSIS — I10 Essential (primary) hypertension: Secondary | ICD-10-CM | POA: Diagnosis not present

## 2018-03-06 DIAGNOSIS — I10 Essential (primary) hypertension: Secondary | ICD-10-CM | POA: Diagnosis not present

## 2018-03-07 DIAGNOSIS — I10 Essential (primary) hypertension: Secondary | ICD-10-CM | POA: Diagnosis not present

## 2018-03-08 DIAGNOSIS — I10 Essential (primary) hypertension: Secondary | ICD-10-CM | POA: Diagnosis not present

## 2018-03-09 DIAGNOSIS — I10 Essential (primary) hypertension: Secondary | ICD-10-CM | POA: Diagnosis not present

## 2018-03-10 DIAGNOSIS — I10 Essential (primary) hypertension: Secondary | ICD-10-CM | POA: Diagnosis not present

## 2018-03-11 DIAGNOSIS — I10 Essential (primary) hypertension: Secondary | ICD-10-CM | POA: Diagnosis not present

## 2018-03-12 DIAGNOSIS — I10 Essential (primary) hypertension: Secondary | ICD-10-CM | POA: Diagnosis not present

## 2018-03-13 DIAGNOSIS — I10 Essential (primary) hypertension: Secondary | ICD-10-CM | POA: Diagnosis not present

## 2018-03-14 DIAGNOSIS — I10 Essential (primary) hypertension: Secondary | ICD-10-CM | POA: Diagnosis not present

## 2018-03-15 DIAGNOSIS — I10 Essential (primary) hypertension: Secondary | ICD-10-CM | POA: Diagnosis not present

## 2018-03-16 DIAGNOSIS — I10 Essential (primary) hypertension: Secondary | ICD-10-CM | POA: Diagnosis not present

## 2018-03-17 DIAGNOSIS — I10 Essential (primary) hypertension: Secondary | ICD-10-CM | POA: Diagnosis not present

## 2018-03-18 DIAGNOSIS — I10 Essential (primary) hypertension: Secondary | ICD-10-CM | POA: Diagnosis not present

## 2018-03-19 DIAGNOSIS — I10 Essential (primary) hypertension: Secondary | ICD-10-CM | POA: Diagnosis not present

## 2018-03-20 DIAGNOSIS — I10 Essential (primary) hypertension: Secondary | ICD-10-CM | POA: Diagnosis not present

## 2018-03-21 DIAGNOSIS — I10 Essential (primary) hypertension: Secondary | ICD-10-CM | POA: Diagnosis not present

## 2018-03-22 DIAGNOSIS — I10 Essential (primary) hypertension: Secondary | ICD-10-CM | POA: Diagnosis not present

## 2018-03-23 DIAGNOSIS — I10 Essential (primary) hypertension: Secondary | ICD-10-CM | POA: Diagnosis not present

## 2018-03-24 DIAGNOSIS — I10 Essential (primary) hypertension: Secondary | ICD-10-CM | POA: Diagnosis not present

## 2018-03-25 DIAGNOSIS — I10 Essential (primary) hypertension: Secondary | ICD-10-CM | POA: Diagnosis not present

## 2018-03-25 DIAGNOSIS — R21 Rash and other nonspecific skin eruption: Secondary | ICD-10-CM | POA: Diagnosis not present

## 2018-03-25 DIAGNOSIS — F339 Major depressive disorder, recurrent, unspecified: Secondary | ICD-10-CM | POA: Diagnosis not present

## 2018-03-25 DIAGNOSIS — M25511 Pain in right shoulder: Secondary | ICD-10-CM | POA: Diagnosis not present

## 2018-03-25 DIAGNOSIS — G8190 Hemiplegia, unspecified affecting unspecified side: Secondary | ICD-10-CM | POA: Diagnosis not present

## 2018-03-25 DIAGNOSIS — I679 Cerebrovascular disease, unspecified: Secondary | ICD-10-CM | POA: Diagnosis not present

## 2018-03-25 DIAGNOSIS — Z79899 Other long term (current) drug therapy: Secondary | ICD-10-CM | POA: Diagnosis not present

## 2018-03-26 DIAGNOSIS — I10 Essential (primary) hypertension: Secondary | ICD-10-CM | POA: Diagnosis not present

## 2018-03-26 DIAGNOSIS — M6282 Rhabdomyolysis: Secondary | ICD-10-CM | POA: Diagnosis not present

## 2018-03-26 DIAGNOSIS — M7989 Other specified soft tissue disorders: Secondary | ICD-10-CM | POA: Diagnosis not present

## 2018-03-26 DIAGNOSIS — B354 Tinea corporis: Secondary | ICD-10-CM | POA: Diagnosis not present

## 2018-03-26 DIAGNOSIS — B351 Tinea unguium: Secondary | ICD-10-CM | POA: Diagnosis not present

## 2018-03-26 DIAGNOSIS — R2681 Unsteadiness on feet: Secondary | ICD-10-CM | POA: Diagnosis not present

## 2018-03-27 DIAGNOSIS — E785 Hyperlipidemia, unspecified: Secondary | ICD-10-CM | POA: Diagnosis not present

## 2018-03-27 DIAGNOSIS — Z0001 Encounter for general adult medical examination with abnormal findings: Secondary | ICD-10-CM | POA: Diagnosis not present

## 2018-03-27 DIAGNOSIS — I10 Essential (primary) hypertension: Secondary | ICD-10-CM | POA: Diagnosis not present

## 2018-03-27 DIAGNOSIS — F331 Major depressive disorder, recurrent, moderate: Secondary | ICD-10-CM | POA: Diagnosis not present

## 2018-03-28 DIAGNOSIS — I10 Essential (primary) hypertension: Secondary | ICD-10-CM | POA: Diagnosis not present

## 2018-03-29 DIAGNOSIS — I10 Essential (primary) hypertension: Secondary | ICD-10-CM | POA: Diagnosis not present

## 2018-03-30 DIAGNOSIS — I10 Essential (primary) hypertension: Secondary | ICD-10-CM | POA: Diagnosis not present

## 2018-03-31 DIAGNOSIS — I69351 Hemiplegia and hemiparesis following cerebral infarction affecting right dominant side: Secondary | ICD-10-CM | POA: Diagnosis not present

## 2018-03-31 DIAGNOSIS — G8190 Hemiplegia, unspecified affecting unspecified side: Secondary | ICD-10-CM | POA: Diagnosis not present

## 2018-03-31 DIAGNOSIS — I369 Nonrheumatic tricuspid valve disorder, unspecified: Secondary | ICD-10-CM | POA: Diagnosis not present

## 2018-03-31 DIAGNOSIS — I10 Essential (primary) hypertension: Secondary | ICD-10-CM | POA: Diagnosis not present

## 2018-03-31 DIAGNOSIS — R261 Paralytic gait: Secondary | ICD-10-CM | POA: Diagnosis not present

## 2018-03-31 DIAGNOSIS — M6281 Muscle weakness (generalized): Secondary | ICD-10-CM | POA: Diagnosis not present

## 2018-03-31 DIAGNOSIS — M25511 Pain in right shoulder: Secondary | ICD-10-CM | POA: Diagnosis not present

## 2018-03-31 DIAGNOSIS — R278 Other lack of coordination: Secondary | ICD-10-CM | POA: Diagnosis not present

## 2018-03-31 DIAGNOSIS — I679 Cerebrovascular disease, unspecified: Secondary | ICD-10-CM | POA: Diagnosis not present

## 2018-04-01 DIAGNOSIS — I10 Essential (primary) hypertension: Secondary | ICD-10-CM | POA: Diagnosis not present

## 2018-04-02 DIAGNOSIS — I10 Essential (primary) hypertension: Secondary | ICD-10-CM | POA: Diagnosis not present

## 2018-04-03 DIAGNOSIS — I10 Essential (primary) hypertension: Secondary | ICD-10-CM | POA: Diagnosis not present

## 2018-04-04 DIAGNOSIS — I10 Essential (primary) hypertension: Secondary | ICD-10-CM | POA: Diagnosis not present

## 2018-04-05 DIAGNOSIS — I10 Essential (primary) hypertension: Secondary | ICD-10-CM | POA: Diagnosis not present

## 2018-04-06 DIAGNOSIS — I10 Essential (primary) hypertension: Secondary | ICD-10-CM | POA: Diagnosis not present

## 2018-04-07 DIAGNOSIS — I10 Essential (primary) hypertension: Secondary | ICD-10-CM | POA: Diagnosis not present

## 2018-04-08 DIAGNOSIS — I10 Essential (primary) hypertension: Secondary | ICD-10-CM | POA: Diagnosis not present

## 2018-04-09 DIAGNOSIS — I10 Essential (primary) hypertension: Secondary | ICD-10-CM | POA: Diagnosis not present

## 2018-04-10 DIAGNOSIS — I10 Essential (primary) hypertension: Secondary | ICD-10-CM | POA: Diagnosis not present

## 2018-04-11 DIAGNOSIS — I10 Essential (primary) hypertension: Secondary | ICD-10-CM | POA: Diagnosis not present

## 2018-04-12 DIAGNOSIS — I10 Essential (primary) hypertension: Secondary | ICD-10-CM | POA: Diagnosis not present

## 2018-04-13 DIAGNOSIS — I10 Essential (primary) hypertension: Secondary | ICD-10-CM | POA: Diagnosis not present

## 2018-04-14 DIAGNOSIS — I10 Essential (primary) hypertension: Secondary | ICD-10-CM | POA: Diagnosis not present

## 2018-04-15 DIAGNOSIS — I10 Essential (primary) hypertension: Secondary | ICD-10-CM | POA: Diagnosis not present

## 2018-04-16 DIAGNOSIS — I10 Essential (primary) hypertension: Secondary | ICD-10-CM | POA: Diagnosis not present

## 2018-04-17 DIAGNOSIS — I10 Essential (primary) hypertension: Secondary | ICD-10-CM | POA: Diagnosis not present

## 2018-04-18 DIAGNOSIS — I10 Essential (primary) hypertension: Secondary | ICD-10-CM | POA: Diagnosis not present

## 2018-04-19 DIAGNOSIS — I10 Essential (primary) hypertension: Secondary | ICD-10-CM | POA: Diagnosis not present

## 2018-04-20 DIAGNOSIS — I10 Essential (primary) hypertension: Secondary | ICD-10-CM | POA: Diagnosis not present

## 2018-04-21 DIAGNOSIS — I10 Essential (primary) hypertension: Secondary | ICD-10-CM | POA: Diagnosis not present

## 2018-04-22 DIAGNOSIS — M25511 Pain in right shoulder: Secondary | ICD-10-CM | POA: Diagnosis not present

## 2018-04-22 DIAGNOSIS — Z79899 Other long term (current) drug therapy: Secondary | ICD-10-CM | POA: Diagnosis not present

## 2018-04-22 DIAGNOSIS — I10 Essential (primary) hypertension: Secondary | ICD-10-CM | POA: Diagnosis not present

## 2018-04-22 DIAGNOSIS — F339 Major depressive disorder, recurrent, unspecified: Secondary | ICD-10-CM | POA: Diagnosis not present

## 2018-04-22 DIAGNOSIS — G8190 Hemiplegia, unspecified affecting unspecified side: Secondary | ICD-10-CM | POA: Diagnosis not present

## 2018-04-22 DIAGNOSIS — N183 Chronic kidney disease, stage 3 (moderate): Secondary | ICD-10-CM | POA: Diagnosis not present

## 2018-04-22 DIAGNOSIS — I679 Cerebrovascular disease, unspecified: Secondary | ICD-10-CM | POA: Diagnosis not present

## 2018-04-22 DIAGNOSIS — R21 Rash and other nonspecific skin eruption: Secondary | ICD-10-CM | POA: Diagnosis not present

## 2018-04-23 DIAGNOSIS — I10 Essential (primary) hypertension: Secondary | ICD-10-CM | POA: Diagnosis not present

## 2018-04-24 DIAGNOSIS — I10 Essential (primary) hypertension: Secondary | ICD-10-CM | POA: Diagnosis not present

## 2018-04-25 DIAGNOSIS — I10 Essential (primary) hypertension: Secondary | ICD-10-CM | POA: Diagnosis not present

## 2018-04-26 DIAGNOSIS — I10 Essential (primary) hypertension: Secondary | ICD-10-CM | POA: Diagnosis not present

## 2018-04-27 DIAGNOSIS — I10 Essential (primary) hypertension: Secondary | ICD-10-CM | POA: Diagnosis not present

## 2018-04-28 DIAGNOSIS — I10 Essential (primary) hypertension: Secondary | ICD-10-CM | POA: Diagnosis not present

## 2018-04-29 DIAGNOSIS — I10 Essential (primary) hypertension: Secondary | ICD-10-CM | POA: Diagnosis not present

## 2018-04-30 DIAGNOSIS — R278 Other lack of coordination: Secondary | ICD-10-CM | POA: Diagnosis not present

## 2018-04-30 DIAGNOSIS — R261 Paralytic gait: Secondary | ICD-10-CM | POA: Diagnosis not present

## 2018-04-30 DIAGNOSIS — I369 Nonrheumatic tricuspid valve disorder, unspecified: Secondary | ICD-10-CM | POA: Diagnosis not present

## 2018-04-30 DIAGNOSIS — G8190 Hemiplegia, unspecified affecting unspecified side: Secondary | ICD-10-CM | POA: Diagnosis not present

## 2018-04-30 DIAGNOSIS — I69351 Hemiplegia and hemiparesis following cerebral infarction affecting right dominant side: Secondary | ICD-10-CM | POA: Diagnosis not present

## 2018-04-30 DIAGNOSIS — I679 Cerebrovascular disease, unspecified: Secondary | ICD-10-CM | POA: Diagnosis not present

## 2018-04-30 DIAGNOSIS — M6281 Muscle weakness (generalized): Secondary | ICD-10-CM | POA: Diagnosis not present

## 2018-04-30 DIAGNOSIS — I10 Essential (primary) hypertension: Secondary | ICD-10-CM | POA: Diagnosis not present

## 2018-04-30 DIAGNOSIS — M25511 Pain in right shoulder: Secondary | ICD-10-CM | POA: Diagnosis not present

## 2018-05-01 DIAGNOSIS — I10 Essential (primary) hypertension: Secondary | ICD-10-CM | POA: Diagnosis not present

## 2018-05-02 DIAGNOSIS — I10 Essential (primary) hypertension: Secondary | ICD-10-CM | POA: Diagnosis not present

## 2018-05-03 DIAGNOSIS — I10 Essential (primary) hypertension: Secondary | ICD-10-CM | POA: Diagnosis not present

## 2018-05-04 DIAGNOSIS — I10 Essential (primary) hypertension: Secondary | ICD-10-CM | POA: Diagnosis not present

## 2018-05-05 DIAGNOSIS — I10 Essential (primary) hypertension: Secondary | ICD-10-CM | POA: Diagnosis not present

## 2018-05-06 DIAGNOSIS — I10 Essential (primary) hypertension: Secondary | ICD-10-CM | POA: Diagnosis not present

## 2018-05-07 DIAGNOSIS — I25119 Atherosclerotic heart disease of native coronary artery with unspecified angina pectoris: Secondary | ICD-10-CM | POA: Diagnosis not present

## 2018-05-07 DIAGNOSIS — B354 Tinea corporis: Secondary | ICD-10-CM | POA: Diagnosis not present

## 2018-05-07 DIAGNOSIS — F331 Major depressive disorder, recurrent, moderate: Secondary | ICD-10-CM | POA: Diagnosis not present

## 2018-05-07 DIAGNOSIS — I10 Essential (primary) hypertension: Secondary | ICD-10-CM | POA: Diagnosis not present

## 2018-05-07 DIAGNOSIS — N181 Chronic kidney disease, stage 1: Secondary | ICD-10-CM | POA: Diagnosis not present

## 2018-05-07 DIAGNOSIS — E785 Hyperlipidemia, unspecified: Secondary | ICD-10-CM | POA: Diagnosis not present

## 2018-05-07 DIAGNOSIS — I69351 Hemiplegia and hemiparesis following cerebral infarction affecting right dominant side: Secondary | ICD-10-CM | POA: Diagnosis not present

## 2018-05-07 DIAGNOSIS — F419 Anxiety disorder, unspecified: Secondary | ICD-10-CM | POA: Diagnosis not present

## 2018-05-07 DIAGNOSIS — G894 Chronic pain syndrome: Secondary | ICD-10-CM | POA: Diagnosis not present

## 2018-05-07 DIAGNOSIS — E559 Vitamin D deficiency, unspecified: Secondary | ICD-10-CM | POA: Diagnosis not present

## 2018-05-08 DIAGNOSIS — I10 Essential (primary) hypertension: Secondary | ICD-10-CM | POA: Diagnosis not present

## 2018-05-09 DIAGNOSIS — I10 Essential (primary) hypertension: Secondary | ICD-10-CM | POA: Diagnosis not present

## 2018-05-10 DIAGNOSIS — I10 Essential (primary) hypertension: Secondary | ICD-10-CM | POA: Diagnosis not present

## 2018-05-11 DIAGNOSIS — I10 Essential (primary) hypertension: Secondary | ICD-10-CM | POA: Diagnosis not present

## 2018-05-12 DIAGNOSIS — I10 Essential (primary) hypertension: Secondary | ICD-10-CM | POA: Diagnosis not present

## 2018-05-13 DIAGNOSIS — I10 Essential (primary) hypertension: Secondary | ICD-10-CM | POA: Diagnosis not present

## 2018-05-14 DIAGNOSIS — I10 Essential (primary) hypertension: Secondary | ICD-10-CM | POA: Diagnosis not present

## 2018-05-15 DIAGNOSIS — I10 Essential (primary) hypertension: Secondary | ICD-10-CM | POA: Diagnosis not present

## 2018-05-16 DIAGNOSIS — I10 Essential (primary) hypertension: Secondary | ICD-10-CM | POA: Diagnosis not present

## 2018-05-17 DIAGNOSIS — I10 Essential (primary) hypertension: Secondary | ICD-10-CM | POA: Diagnosis not present

## 2018-05-18 DIAGNOSIS — I10 Essential (primary) hypertension: Secondary | ICD-10-CM | POA: Diagnosis not present

## 2018-05-19 DIAGNOSIS — I10 Essential (primary) hypertension: Secondary | ICD-10-CM | POA: Diagnosis not present

## 2018-05-20 DIAGNOSIS — Z79899 Other long term (current) drug therapy: Secondary | ICD-10-CM | POA: Diagnosis not present

## 2018-05-20 DIAGNOSIS — I679 Cerebrovascular disease, unspecified: Secondary | ICD-10-CM | POA: Diagnosis not present

## 2018-05-20 DIAGNOSIS — I10 Essential (primary) hypertension: Secondary | ICD-10-CM | POA: Diagnosis not present

## 2018-05-20 DIAGNOSIS — I5020 Unspecified systolic (congestive) heart failure: Secondary | ICD-10-CM | POA: Diagnosis not present

## 2018-05-20 DIAGNOSIS — F339 Major depressive disorder, recurrent, unspecified: Secondary | ICD-10-CM | POA: Diagnosis not present

## 2018-05-20 DIAGNOSIS — G8190 Hemiplegia, unspecified affecting unspecified side: Secondary | ICD-10-CM | POA: Diagnosis not present

## 2018-05-20 DIAGNOSIS — M25511 Pain in right shoulder: Secondary | ICD-10-CM | POA: Diagnosis not present

## 2018-05-21 DIAGNOSIS — I10 Essential (primary) hypertension: Secondary | ICD-10-CM | POA: Diagnosis not present

## 2018-05-22 DIAGNOSIS — I10 Essential (primary) hypertension: Secondary | ICD-10-CM | POA: Diagnosis not present

## 2018-05-23 DIAGNOSIS — I10 Essential (primary) hypertension: Secondary | ICD-10-CM | POA: Diagnosis not present

## 2018-05-24 DIAGNOSIS — I10 Essential (primary) hypertension: Secondary | ICD-10-CM | POA: Diagnosis not present

## 2018-05-25 DIAGNOSIS — I10 Essential (primary) hypertension: Secondary | ICD-10-CM | POA: Diagnosis not present

## 2018-05-26 DIAGNOSIS — I10 Essential (primary) hypertension: Secondary | ICD-10-CM | POA: Diagnosis not present

## 2018-05-27 DIAGNOSIS — I69351 Hemiplegia and hemiparesis following cerebral infarction affecting right dominant side: Secondary | ICD-10-CM | POA: Diagnosis not present

## 2018-05-27 DIAGNOSIS — R261 Paralytic gait: Secondary | ICD-10-CM | POA: Diagnosis not present

## 2018-05-27 DIAGNOSIS — I10 Essential (primary) hypertension: Secondary | ICD-10-CM | POA: Diagnosis not present

## 2018-05-27 DIAGNOSIS — R278 Other lack of coordination: Secondary | ICD-10-CM | POA: Diagnosis not present

## 2018-05-27 DIAGNOSIS — I369 Nonrheumatic tricuspid valve disorder, unspecified: Secondary | ICD-10-CM | POA: Diagnosis not present

## 2018-05-27 DIAGNOSIS — M6281 Muscle weakness (generalized): Secondary | ICD-10-CM | POA: Diagnosis not present

## 2018-05-28 DIAGNOSIS — I10 Essential (primary) hypertension: Secondary | ICD-10-CM | POA: Diagnosis not present

## 2018-05-29 DIAGNOSIS — I10 Essential (primary) hypertension: Secondary | ICD-10-CM | POA: Diagnosis not present

## 2018-05-30 DIAGNOSIS — R278 Other lack of coordination: Secondary | ICD-10-CM | POA: Diagnosis not present

## 2018-05-30 DIAGNOSIS — I10 Essential (primary) hypertension: Secondary | ICD-10-CM | POA: Diagnosis not present

## 2018-05-30 DIAGNOSIS — M6281 Muscle weakness (generalized): Secondary | ICD-10-CM | POA: Diagnosis not present

## 2018-05-30 DIAGNOSIS — M25511 Pain in right shoulder: Secondary | ICD-10-CM | POA: Diagnosis not present

## 2018-05-30 DIAGNOSIS — I679 Cerebrovascular disease, unspecified: Secondary | ICD-10-CM | POA: Diagnosis not present

## 2018-05-30 DIAGNOSIS — R261 Paralytic gait: Secondary | ICD-10-CM | POA: Diagnosis not present

## 2018-05-30 DIAGNOSIS — G8190 Hemiplegia, unspecified affecting unspecified side: Secondary | ICD-10-CM | POA: Diagnosis not present

## 2018-05-30 DIAGNOSIS — I69351 Hemiplegia and hemiparesis following cerebral infarction affecting right dominant side: Secondary | ICD-10-CM | POA: Diagnosis not present

## 2018-05-30 DIAGNOSIS — I369 Nonrheumatic tricuspid valve disorder, unspecified: Secondary | ICD-10-CM | POA: Diagnosis not present

## 2018-05-31 DIAGNOSIS — I10 Essential (primary) hypertension: Secondary | ICD-10-CM | POA: Diagnosis not present

## 2018-06-01 DIAGNOSIS — I10 Essential (primary) hypertension: Secondary | ICD-10-CM | POA: Diagnosis not present

## 2018-06-02 DIAGNOSIS — I10 Essential (primary) hypertension: Secondary | ICD-10-CM | POA: Diagnosis not present

## 2018-06-03 DIAGNOSIS — I10 Essential (primary) hypertension: Secondary | ICD-10-CM | POA: Diagnosis not present

## 2018-06-04 DIAGNOSIS — I10 Essential (primary) hypertension: Secondary | ICD-10-CM | POA: Diagnosis not present

## 2018-06-05 DIAGNOSIS — I10 Essential (primary) hypertension: Secondary | ICD-10-CM | POA: Diagnosis not present

## 2018-06-06 DIAGNOSIS — I10 Essential (primary) hypertension: Secondary | ICD-10-CM | POA: Diagnosis not present

## 2018-06-07 DIAGNOSIS — I10 Essential (primary) hypertension: Secondary | ICD-10-CM | POA: Diagnosis not present

## 2018-06-08 DIAGNOSIS — I10 Essential (primary) hypertension: Secondary | ICD-10-CM | POA: Diagnosis not present

## 2018-06-09 DIAGNOSIS — I10 Essential (primary) hypertension: Secondary | ICD-10-CM | POA: Diagnosis not present

## 2018-06-10 DIAGNOSIS — I10 Essential (primary) hypertension: Secondary | ICD-10-CM | POA: Diagnosis not present

## 2018-06-11 DIAGNOSIS — I10 Essential (primary) hypertension: Secondary | ICD-10-CM | POA: Diagnosis not present

## 2018-06-12 DIAGNOSIS — I10 Essential (primary) hypertension: Secondary | ICD-10-CM | POA: Diagnosis not present

## 2018-06-13 DIAGNOSIS — I10 Essential (primary) hypertension: Secondary | ICD-10-CM | POA: Diagnosis not present

## 2018-06-14 DIAGNOSIS — I10 Essential (primary) hypertension: Secondary | ICD-10-CM | POA: Diagnosis not present

## 2018-06-15 DIAGNOSIS — I10 Essential (primary) hypertension: Secondary | ICD-10-CM | POA: Diagnosis not present

## 2018-06-16 DIAGNOSIS — I10 Essential (primary) hypertension: Secondary | ICD-10-CM | POA: Diagnosis not present

## 2018-06-17 ENCOUNTER — Encounter: Payer: Self-pay | Admitting: Cardiology

## 2018-06-17 DIAGNOSIS — G8190 Hemiplegia, unspecified affecting unspecified side: Secondary | ICD-10-CM | POA: Diagnosis not present

## 2018-06-17 DIAGNOSIS — M25511 Pain in right shoulder: Secondary | ICD-10-CM | POA: Diagnosis not present

## 2018-06-17 DIAGNOSIS — I5020 Unspecified systolic (congestive) heart failure: Secondary | ICD-10-CM | POA: Diagnosis not present

## 2018-06-17 DIAGNOSIS — Z79899 Other long term (current) drug therapy: Secondary | ICD-10-CM | POA: Diagnosis not present

## 2018-06-17 DIAGNOSIS — I10 Essential (primary) hypertension: Secondary | ICD-10-CM | POA: Diagnosis not present

## 2018-06-17 DIAGNOSIS — I679 Cerebrovascular disease, unspecified: Secondary | ICD-10-CM | POA: Diagnosis not present

## 2018-06-17 DIAGNOSIS — F339 Major depressive disorder, recurrent, unspecified: Secondary | ICD-10-CM | POA: Diagnosis not present

## 2018-06-18 ENCOUNTER — Other Ambulatory Visit: Payer: Self-pay

## 2018-06-18 ENCOUNTER — Encounter: Payer: Self-pay | Admitting: Cardiology

## 2018-06-18 ENCOUNTER — Encounter: Payer: Medicaid Other | Admitting: Cardiology

## 2018-06-18 DIAGNOSIS — I10 Essential (primary) hypertension: Secondary | ICD-10-CM | POA: Diagnosis not present

## 2018-06-18 NOTE — Progress Notes (Signed)
Erroneous encounter

## 2018-06-19 DIAGNOSIS — I10 Essential (primary) hypertension: Secondary | ICD-10-CM | POA: Diagnosis not present

## 2018-06-20 DIAGNOSIS — I10 Essential (primary) hypertension: Secondary | ICD-10-CM | POA: Diagnosis not present

## 2018-06-21 DIAGNOSIS — I10 Essential (primary) hypertension: Secondary | ICD-10-CM | POA: Diagnosis not present

## 2018-06-22 DIAGNOSIS — I10 Essential (primary) hypertension: Secondary | ICD-10-CM | POA: Diagnosis not present

## 2018-06-23 DIAGNOSIS — I10 Essential (primary) hypertension: Secondary | ICD-10-CM | POA: Diagnosis not present

## 2018-06-24 DIAGNOSIS — I10 Essential (primary) hypertension: Secondary | ICD-10-CM | POA: Diagnosis not present

## 2018-06-25 DIAGNOSIS — I10 Essential (primary) hypertension: Secondary | ICD-10-CM | POA: Diagnosis not present

## 2018-06-26 DIAGNOSIS — I369 Nonrheumatic tricuspid valve disorder, unspecified: Secondary | ICD-10-CM | POA: Diagnosis not present

## 2018-06-26 DIAGNOSIS — M6281 Muscle weakness (generalized): Secondary | ICD-10-CM | POA: Diagnosis not present

## 2018-06-26 DIAGNOSIS — R278 Other lack of coordination: Secondary | ICD-10-CM | POA: Diagnosis not present

## 2018-06-26 DIAGNOSIS — I69351 Hemiplegia and hemiparesis following cerebral infarction affecting right dominant side: Secondary | ICD-10-CM | POA: Diagnosis not present

## 2018-06-26 DIAGNOSIS — R261 Paralytic gait: Secondary | ICD-10-CM | POA: Diagnosis not present

## 2018-06-26 DIAGNOSIS — I10 Essential (primary) hypertension: Secondary | ICD-10-CM | POA: Diagnosis not present

## 2018-06-27 DIAGNOSIS — I10 Essential (primary) hypertension: Secondary | ICD-10-CM | POA: Diagnosis not present

## 2018-06-28 DIAGNOSIS — I10 Essential (primary) hypertension: Secondary | ICD-10-CM | POA: Diagnosis not present

## 2018-06-29 DIAGNOSIS — I10 Essential (primary) hypertension: Secondary | ICD-10-CM | POA: Diagnosis not present

## 2018-06-30 DIAGNOSIS — M25511 Pain in right shoulder: Secondary | ICD-10-CM | POA: Diagnosis not present

## 2018-06-30 DIAGNOSIS — R261 Paralytic gait: Secondary | ICD-10-CM | POA: Diagnosis not present

## 2018-06-30 DIAGNOSIS — I10 Essential (primary) hypertension: Secondary | ICD-10-CM | POA: Diagnosis not present

## 2018-06-30 DIAGNOSIS — I69351 Hemiplegia and hemiparesis following cerebral infarction affecting right dominant side: Secondary | ICD-10-CM | POA: Diagnosis not present

## 2018-06-30 DIAGNOSIS — I679 Cerebrovascular disease, unspecified: Secondary | ICD-10-CM | POA: Diagnosis not present

## 2018-06-30 DIAGNOSIS — I369 Nonrheumatic tricuspid valve disorder, unspecified: Secondary | ICD-10-CM | POA: Diagnosis not present

## 2018-06-30 DIAGNOSIS — R278 Other lack of coordination: Secondary | ICD-10-CM | POA: Diagnosis not present

## 2018-06-30 DIAGNOSIS — M6281 Muscle weakness (generalized): Secondary | ICD-10-CM | POA: Diagnosis not present

## 2018-06-30 DIAGNOSIS — G8190 Hemiplegia, unspecified affecting unspecified side: Secondary | ICD-10-CM | POA: Diagnosis not present

## 2018-07-01 DIAGNOSIS — I10 Essential (primary) hypertension: Secondary | ICD-10-CM | POA: Diagnosis not present

## 2018-07-02 DIAGNOSIS — I10 Essential (primary) hypertension: Secondary | ICD-10-CM | POA: Diagnosis not present

## 2018-07-03 DIAGNOSIS — I10 Essential (primary) hypertension: Secondary | ICD-10-CM | POA: Diagnosis not present

## 2018-07-04 DIAGNOSIS — I10 Essential (primary) hypertension: Secondary | ICD-10-CM | POA: Diagnosis not present

## 2018-07-05 DIAGNOSIS — I10 Essential (primary) hypertension: Secondary | ICD-10-CM | POA: Diagnosis not present

## 2018-07-06 DIAGNOSIS — Z79899 Other long term (current) drug therapy: Secondary | ICD-10-CM | POA: Diagnosis not present

## 2018-07-06 DIAGNOSIS — I10 Essential (primary) hypertension: Secondary | ICD-10-CM | POA: Diagnosis not present

## 2018-07-07 DIAGNOSIS — I10 Essential (primary) hypertension: Secondary | ICD-10-CM | POA: Diagnosis not present

## 2018-07-08 DIAGNOSIS — I10 Essential (primary) hypertension: Secondary | ICD-10-CM | POA: Diagnosis not present

## 2018-07-09 DIAGNOSIS — I10 Essential (primary) hypertension: Secondary | ICD-10-CM | POA: Diagnosis not present

## 2018-07-10 DIAGNOSIS — I10 Essential (primary) hypertension: Secondary | ICD-10-CM | POA: Diagnosis not present

## 2018-07-11 DIAGNOSIS — I10 Essential (primary) hypertension: Secondary | ICD-10-CM | POA: Diagnosis not present

## 2018-07-12 DIAGNOSIS — I10 Essential (primary) hypertension: Secondary | ICD-10-CM | POA: Diagnosis not present

## 2018-07-13 DIAGNOSIS — I10 Essential (primary) hypertension: Secondary | ICD-10-CM | POA: Diagnosis not present

## 2018-07-14 DIAGNOSIS — I10 Essential (primary) hypertension: Secondary | ICD-10-CM | POA: Diagnosis not present

## 2018-07-15 DIAGNOSIS — M25511 Pain in right shoulder: Secondary | ICD-10-CM | POA: Diagnosis not present

## 2018-07-15 DIAGNOSIS — G8190 Hemiplegia, unspecified affecting unspecified side: Secondary | ICD-10-CM | POA: Diagnosis not present

## 2018-07-15 DIAGNOSIS — F339 Major depressive disorder, recurrent, unspecified: Secondary | ICD-10-CM | POA: Diagnosis not present

## 2018-07-15 DIAGNOSIS — Z79899 Other long term (current) drug therapy: Secondary | ICD-10-CM | POA: Diagnosis not present

## 2018-07-15 DIAGNOSIS — I679 Cerebrovascular disease, unspecified: Secondary | ICD-10-CM | POA: Diagnosis not present

## 2018-07-15 DIAGNOSIS — I10 Essential (primary) hypertension: Secondary | ICD-10-CM | POA: Diagnosis not present

## 2018-07-15 DIAGNOSIS — I5020 Unspecified systolic (congestive) heart failure: Secondary | ICD-10-CM | POA: Diagnosis not present

## 2018-07-16 DIAGNOSIS — I10 Essential (primary) hypertension: Secondary | ICD-10-CM | POA: Diagnosis not present

## 2018-07-17 DIAGNOSIS — I10 Essential (primary) hypertension: Secondary | ICD-10-CM | POA: Diagnosis not present

## 2018-07-18 DIAGNOSIS — I10 Essential (primary) hypertension: Secondary | ICD-10-CM | POA: Diagnosis not present

## 2018-07-19 DIAGNOSIS — I10 Essential (primary) hypertension: Secondary | ICD-10-CM | POA: Diagnosis not present

## 2018-07-20 DIAGNOSIS — I10 Essential (primary) hypertension: Secondary | ICD-10-CM | POA: Diagnosis not present

## 2018-07-21 DIAGNOSIS — I10 Essential (primary) hypertension: Secondary | ICD-10-CM | POA: Diagnosis not present

## 2018-07-22 DIAGNOSIS — I10 Essential (primary) hypertension: Secondary | ICD-10-CM | POA: Diagnosis not present

## 2018-07-23 DIAGNOSIS — I10 Essential (primary) hypertension: Secondary | ICD-10-CM | POA: Diagnosis not present

## 2018-07-24 DIAGNOSIS — I10 Essential (primary) hypertension: Secondary | ICD-10-CM | POA: Diagnosis not present

## 2018-07-25 DIAGNOSIS — I10 Essential (primary) hypertension: Secondary | ICD-10-CM | POA: Diagnosis not present

## 2018-07-26 DIAGNOSIS — I10 Essential (primary) hypertension: Secondary | ICD-10-CM | POA: Diagnosis not present

## 2018-07-27 DIAGNOSIS — R278 Other lack of coordination: Secondary | ICD-10-CM | POA: Diagnosis not present

## 2018-07-27 DIAGNOSIS — R261 Paralytic gait: Secondary | ICD-10-CM | POA: Diagnosis not present

## 2018-07-27 DIAGNOSIS — I69351 Hemiplegia and hemiparesis following cerebral infarction affecting right dominant side: Secondary | ICD-10-CM | POA: Diagnosis not present

## 2018-07-27 DIAGNOSIS — I10 Essential (primary) hypertension: Secondary | ICD-10-CM | POA: Diagnosis not present

## 2018-07-27 DIAGNOSIS — M6281 Muscle weakness (generalized): Secondary | ICD-10-CM | POA: Diagnosis not present

## 2018-07-27 DIAGNOSIS — I369 Nonrheumatic tricuspid valve disorder, unspecified: Secondary | ICD-10-CM | POA: Diagnosis not present

## 2018-07-28 DIAGNOSIS — I10 Essential (primary) hypertension: Secondary | ICD-10-CM | POA: Diagnosis not present

## 2018-07-29 DIAGNOSIS — I10 Essential (primary) hypertension: Secondary | ICD-10-CM | POA: Diagnosis not present

## 2018-07-30 DIAGNOSIS — M6281 Muscle weakness (generalized): Secondary | ICD-10-CM | POA: Diagnosis not present

## 2018-07-30 DIAGNOSIS — R261 Paralytic gait: Secondary | ICD-10-CM | POA: Diagnosis not present

## 2018-07-30 DIAGNOSIS — I679 Cerebrovascular disease, unspecified: Secondary | ICD-10-CM | POA: Diagnosis not present

## 2018-07-30 DIAGNOSIS — I10 Essential (primary) hypertension: Secondary | ICD-10-CM | POA: Diagnosis not present

## 2018-07-30 DIAGNOSIS — I69351 Hemiplegia and hemiparesis following cerebral infarction affecting right dominant side: Secondary | ICD-10-CM | POA: Diagnosis not present

## 2018-07-30 DIAGNOSIS — G8190 Hemiplegia, unspecified affecting unspecified side: Secondary | ICD-10-CM | POA: Diagnosis not present

## 2018-07-30 DIAGNOSIS — I369 Nonrheumatic tricuspid valve disorder, unspecified: Secondary | ICD-10-CM | POA: Diagnosis not present

## 2018-07-30 DIAGNOSIS — R278 Other lack of coordination: Secondary | ICD-10-CM | POA: Diagnosis not present

## 2018-07-30 DIAGNOSIS — M25511 Pain in right shoulder: Secondary | ICD-10-CM | POA: Diagnosis not present

## 2018-07-31 DIAGNOSIS — I10 Essential (primary) hypertension: Secondary | ICD-10-CM | POA: Diagnosis not present

## 2018-08-01 DIAGNOSIS — I10 Essential (primary) hypertension: Secondary | ICD-10-CM | POA: Diagnosis not present

## 2018-08-02 DIAGNOSIS — I10 Essential (primary) hypertension: Secondary | ICD-10-CM | POA: Diagnosis not present

## 2018-08-03 DIAGNOSIS — I10 Essential (primary) hypertension: Secondary | ICD-10-CM | POA: Diagnosis not present

## 2018-08-04 DIAGNOSIS — I10 Essential (primary) hypertension: Secondary | ICD-10-CM | POA: Diagnosis not present

## 2018-08-05 DIAGNOSIS — I10 Essential (primary) hypertension: Secondary | ICD-10-CM | POA: Diagnosis not present

## 2018-08-06 DIAGNOSIS — I10 Essential (primary) hypertension: Secondary | ICD-10-CM | POA: Diagnosis not present

## 2018-08-07 DIAGNOSIS — I10 Essential (primary) hypertension: Secondary | ICD-10-CM | POA: Diagnosis not present

## 2018-08-08 DIAGNOSIS — I10 Essential (primary) hypertension: Secondary | ICD-10-CM | POA: Diagnosis not present

## 2018-08-09 DIAGNOSIS — I10 Essential (primary) hypertension: Secondary | ICD-10-CM | POA: Diagnosis not present

## 2018-08-10 DIAGNOSIS — I10 Essential (primary) hypertension: Secondary | ICD-10-CM | POA: Diagnosis not present

## 2018-08-11 DIAGNOSIS — I10 Essential (primary) hypertension: Secondary | ICD-10-CM | POA: Diagnosis not present

## 2018-08-12 DIAGNOSIS — I10 Essential (primary) hypertension: Secondary | ICD-10-CM | POA: Diagnosis not present

## 2018-08-12 DIAGNOSIS — F339 Major depressive disorder, recurrent, unspecified: Secondary | ICD-10-CM | POA: Diagnosis not present

## 2018-08-12 DIAGNOSIS — Z79899 Other long term (current) drug therapy: Secondary | ICD-10-CM | POA: Diagnosis not present

## 2018-08-12 DIAGNOSIS — I679 Cerebrovascular disease, unspecified: Secondary | ICD-10-CM | POA: Diagnosis not present

## 2018-08-12 DIAGNOSIS — M25511 Pain in right shoulder: Secondary | ICD-10-CM | POA: Diagnosis not present

## 2018-08-12 DIAGNOSIS — G8190 Hemiplegia, unspecified affecting unspecified side: Secondary | ICD-10-CM | POA: Diagnosis not present

## 2018-08-13 DIAGNOSIS — I10 Essential (primary) hypertension: Secondary | ICD-10-CM | POA: Diagnosis not present

## 2018-08-14 DIAGNOSIS — I10 Essential (primary) hypertension: Secondary | ICD-10-CM | POA: Diagnosis not present

## 2018-08-15 DIAGNOSIS — I10 Essential (primary) hypertension: Secondary | ICD-10-CM | POA: Diagnosis not present

## 2018-08-16 DIAGNOSIS — I10 Essential (primary) hypertension: Secondary | ICD-10-CM | POA: Diagnosis not present

## 2018-08-17 DIAGNOSIS — I10 Essential (primary) hypertension: Secondary | ICD-10-CM | POA: Diagnosis not present

## 2018-08-18 DIAGNOSIS — I10 Essential (primary) hypertension: Secondary | ICD-10-CM | POA: Diagnosis not present

## 2018-08-19 DIAGNOSIS — I10 Essential (primary) hypertension: Secondary | ICD-10-CM | POA: Diagnosis not present

## 2018-08-20 DIAGNOSIS — I10 Essential (primary) hypertension: Secondary | ICD-10-CM | POA: Diagnosis not present

## 2018-08-21 DIAGNOSIS — I10 Essential (primary) hypertension: Secondary | ICD-10-CM | POA: Diagnosis not present

## 2018-08-22 DIAGNOSIS — I10 Essential (primary) hypertension: Secondary | ICD-10-CM | POA: Diagnosis not present

## 2018-08-23 DIAGNOSIS — I10 Essential (primary) hypertension: Secondary | ICD-10-CM | POA: Diagnosis not present

## 2018-08-24 DIAGNOSIS — I10 Essential (primary) hypertension: Secondary | ICD-10-CM | POA: Diagnosis not present

## 2018-08-25 DIAGNOSIS — I10 Essential (primary) hypertension: Secondary | ICD-10-CM | POA: Diagnosis not present

## 2018-08-26 DIAGNOSIS — I369 Nonrheumatic tricuspid valve disorder, unspecified: Secondary | ICD-10-CM | POA: Diagnosis not present

## 2018-08-26 DIAGNOSIS — I69351 Hemiplegia and hemiparesis following cerebral infarction affecting right dominant side: Secondary | ICD-10-CM | POA: Diagnosis not present

## 2018-08-26 DIAGNOSIS — M6281 Muscle weakness (generalized): Secondary | ICD-10-CM | POA: Diagnosis not present

## 2018-08-26 DIAGNOSIS — R278 Other lack of coordination: Secondary | ICD-10-CM | POA: Diagnosis not present

## 2018-08-26 DIAGNOSIS — I10 Essential (primary) hypertension: Secondary | ICD-10-CM | POA: Diagnosis not present

## 2018-08-26 DIAGNOSIS — R261 Paralytic gait: Secondary | ICD-10-CM | POA: Diagnosis not present

## 2018-08-27 DIAGNOSIS — I10 Essential (primary) hypertension: Secondary | ICD-10-CM | POA: Diagnosis not present

## 2018-08-28 DIAGNOSIS — I10 Essential (primary) hypertension: Secondary | ICD-10-CM | POA: Diagnosis not present

## 2018-08-29 DIAGNOSIS — I10 Essential (primary) hypertension: Secondary | ICD-10-CM | POA: Diagnosis not present

## 2018-08-30 DIAGNOSIS — I69351 Hemiplegia and hemiparesis following cerebral infarction affecting right dominant side: Secondary | ICD-10-CM | POA: Diagnosis not present

## 2018-08-30 DIAGNOSIS — I10 Essential (primary) hypertension: Secondary | ICD-10-CM | POA: Diagnosis not present

## 2018-08-30 DIAGNOSIS — R278 Other lack of coordination: Secondary | ICD-10-CM | POA: Diagnosis not present

## 2018-08-30 DIAGNOSIS — I679 Cerebrovascular disease, unspecified: Secondary | ICD-10-CM | POA: Diagnosis not present

## 2018-08-30 DIAGNOSIS — M25511 Pain in right shoulder: Secondary | ICD-10-CM | POA: Diagnosis not present

## 2018-08-30 DIAGNOSIS — R261 Paralytic gait: Secondary | ICD-10-CM | POA: Diagnosis not present

## 2018-08-30 DIAGNOSIS — I369 Nonrheumatic tricuspid valve disorder, unspecified: Secondary | ICD-10-CM | POA: Diagnosis not present

## 2018-08-30 DIAGNOSIS — G8190 Hemiplegia, unspecified affecting unspecified side: Secondary | ICD-10-CM | POA: Diagnosis not present

## 2018-08-30 DIAGNOSIS — M6281 Muscle weakness (generalized): Secondary | ICD-10-CM | POA: Diagnosis not present

## 2018-08-31 DIAGNOSIS — I10 Essential (primary) hypertension: Secondary | ICD-10-CM | POA: Diagnosis not present

## 2018-09-01 DIAGNOSIS — I10 Essential (primary) hypertension: Secondary | ICD-10-CM | POA: Diagnosis not present

## 2018-09-02 DIAGNOSIS — I679 Cerebrovascular disease, unspecified: Secondary | ICD-10-CM | POA: Diagnosis not present

## 2018-09-02 DIAGNOSIS — F5109 Other insomnia not due to a substance or known physiological condition: Secondary | ICD-10-CM | POA: Diagnosis not present

## 2018-09-02 DIAGNOSIS — G8190 Hemiplegia, unspecified affecting unspecified side: Secondary | ICD-10-CM | POA: Diagnosis not present

## 2018-09-02 DIAGNOSIS — I5020 Unspecified systolic (congestive) heart failure: Secondary | ICD-10-CM | POA: Diagnosis not present

## 2018-09-02 DIAGNOSIS — Z79899 Other long term (current) drug therapy: Secondary | ICD-10-CM | POA: Diagnosis not present

## 2018-09-02 DIAGNOSIS — M25511 Pain in right shoulder: Secondary | ICD-10-CM | POA: Diagnosis not present

## 2018-09-02 DIAGNOSIS — I10 Essential (primary) hypertension: Secondary | ICD-10-CM | POA: Diagnosis not present

## 2018-09-02 DIAGNOSIS — L299 Pruritus, unspecified: Secondary | ICD-10-CM | POA: Diagnosis not present

## 2018-09-02 DIAGNOSIS — F339 Major depressive disorder, recurrent, unspecified: Secondary | ICD-10-CM | POA: Diagnosis not present

## 2018-09-03 DIAGNOSIS — I10 Essential (primary) hypertension: Secondary | ICD-10-CM | POA: Diagnosis not present

## 2018-09-04 DIAGNOSIS — I10 Essential (primary) hypertension: Secondary | ICD-10-CM | POA: Diagnosis not present

## 2018-09-05 DIAGNOSIS — I10 Essential (primary) hypertension: Secondary | ICD-10-CM | POA: Diagnosis not present

## 2018-09-06 DIAGNOSIS — I10 Essential (primary) hypertension: Secondary | ICD-10-CM | POA: Diagnosis not present

## 2018-09-07 DIAGNOSIS — I10 Essential (primary) hypertension: Secondary | ICD-10-CM | POA: Diagnosis not present

## 2018-09-08 DIAGNOSIS — I10 Essential (primary) hypertension: Secondary | ICD-10-CM | POA: Diagnosis not present

## 2018-09-09 DIAGNOSIS — I10 Essential (primary) hypertension: Secondary | ICD-10-CM | POA: Diagnosis not present

## 2018-09-10 DIAGNOSIS — I10 Essential (primary) hypertension: Secondary | ICD-10-CM | POA: Diagnosis not present

## 2018-09-11 DIAGNOSIS — I10 Essential (primary) hypertension: Secondary | ICD-10-CM | POA: Diagnosis not present

## 2018-09-12 DIAGNOSIS — I10 Essential (primary) hypertension: Secondary | ICD-10-CM | POA: Diagnosis not present

## 2018-09-13 DIAGNOSIS — I10 Essential (primary) hypertension: Secondary | ICD-10-CM | POA: Diagnosis not present

## 2018-09-14 DIAGNOSIS — I10 Essential (primary) hypertension: Secondary | ICD-10-CM | POA: Diagnosis not present

## 2018-09-15 DIAGNOSIS — I10 Essential (primary) hypertension: Secondary | ICD-10-CM | POA: Diagnosis not present

## 2018-09-16 DIAGNOSIS — I10 Essential (primary) hypertension: Secondary | ICD-10-CM | POA: Diagnosis not present

## 2018-09-17 DIAGNOSIS — I10 Essential (primary) hypertension: Secondary | ICD-10-CM | POA: Diagnosis not present

## 2018-09-18 DIAGNOSIS — I10 Essential (primary) hypertension: Secondary | ICD-10-CM | POA: Diagnosis not present

## 2018-09-19 DIAGNOSIS — I10 Essential (primary) hypertension: Secondary | ICD-10-CM | POA: Diagnosis not present

## 2018-09-20 DIAGNOSIS — I10 Essential (primary) hypertension: Secondary | ICD-10-CM | POA: Diagnosis not present

## 2018-09-21 DIAGNOSIS — I10 Essential (primary) hypertension: Secondary | ICD-10-CM | POA: Diagnosis not present

## 2018-09-22 DIAGNOSIS — I10 Essential (primary) hypertension: Secondary | ICD-10-CM | POA: Diagnosis not present

## 2018-09-23 DIAGNOSIS — I10 Essential (primary) hypertension: Secondary | ICD-10-CM | POA: Diagnosis not present

## 2018-09-24 DIAGNOSIS — I10 Essential (primary) hypertension: Secondary | ICD-10-CM | POA: Diagnosis not present

## 2018-09-25 DIAGNOSIS — I10 Essential (primary) hypertension: Secondary | ICD-10-CM | POA: Diagnosis not present

## 2018-09-26 DIAGNOSIS — R261 Paralytic gait: Secondary | ICD-10-CM | POA: Diagnosis not present

## 2018-09-26 DIAGNOSIS — R278 Other lack of coordination: Secondary | ICD-10-CM | POA: Diagnosis not present

## 2018-09-26 DIAGNOSIS — I69351 Hemiplegia and hemiparesis following cerebral infarction affecting right dominant side: Secondary | ICD-10-CM | POA: Diagnosis not present

## 2018-09-26 DIAGNOSIS — I369 Nonrheumatic tricuspid valve disorder, unspecified: Secondary | ICD-10-CM | POA: Diagnosis not present

## 2018-09-26 DIAGNOSIS — M6281 Muscle weakness (generalized): Secondary | ICD-10-CM | POA: Diagnosis not present

## 2018-09-26 DIAGNOSIS — I10 Essential (primary) hypertension: Secondary | ICD-10-CM | POA: Diagnosis not present

## 2018-09-27 DIAGNOSIS — I10 Essential (primary) hypertension: Secondary | ICD-10-CM | POA: Diagnosis not present

## 2018-09-28 DIAGNOSIS — I10 Essential (primary) hypertension: Secondary | ICD-10-CM | POA: Diagnosis not present

## 2018-09-29 DIAGNOSIS — I10 Essential (primary) hypertension: Secondary | ICD-10-CM | POA: Diagnosis not present

## 2018-09-30 DIAGNOSIS — I369 Nonrheumatic tricuspid valve disorder, unspecified: Secondary | ICD-10-CM | POA: Diagnosis not present

## 2018-09-30 DIAGNOSIS — F339 Major depressive disorder, recurrent, unspecified: Secondary | ICD-10-CM | POA: Diagnosis not present

## 2018-09-30 DIAGNOSIS — R261 Paralytic gait: Secondary | ICD-10-CM | POA: Diagnosis not present

## 2018-09-30 DIAGNOSIS — I5020 Unspecified systolic (congestive) heart failure: Secondary | ICD-10-CM | POA: Diagnosis not present

## 2018-09-30 DIAGNOSIS — Z79899 Other long term (current) drug therapy: Secondary | ICD-10-CM | POA: Diagnosis not present

## 2018-09-30 DIAGNOSIS — M25511 Pain in right shoulder: Secondary | ICD-10-CM | POA: Diagnosis not present

## 2018-09-30 DIAGNOSIS — I679 Cerebrovascular disease, unspecified: Secondary | ICD-10-CM | POA: Diagnosis not present

## 2018-09-30 DIAGNOSIS — I69351 Hemiplegia and hemiparesis following cerebral infarction affecting right dominant side: Secondary | ICD-10-CM | POA: Diagnosis not present

## 2018-09-30 DIAGNOSIS — G8190 Hemiplegia, unspecified affecting unspecified side: Secondary | ICD-10-CM | POA: Diagnosis not present

## 2018-09-30 DIAGNOSIS — M6281 Muscle weakness (generalized): Secondary | ICD-10-CM | POA: Diagnosis not present

## 2018-09-30 DIAGNOSIS — R278 Other lack of coordination: Secondary | ICD-10-CM | POA: Diagnosis not present

## 2018-09-30 DIAGNOSIS — I10 Essential (primary) hypertension: Secondary | ICD-10-CM | POA: Diagnosis not present

## 2018-10-01 DIAGNOSIS — I10 Essential (primary) hypertension: Secondary | ICD-10-CM | POA: Diagnosis not present

## 2018-10-02 DIAGNOSIS — I10 Essential (primary) hypertension: Secondary | ICD-10-CM | POA: Diagnosis not present

## 2018-10-03 DIAGNOSIS — I10 Essential (primary) hypertension: Secondary | ICD-10-CM | POA: Diagnosis not present

## 2018-10-04 DIAGNOSIS — I10 Essential (primary) hypertension: Secondary | ICD-10-CM | POA: Diagnosis not present

## 2018-10-05 DIAGNOSIS — I10 Essential (primary) hypertension: Secondary | ICD-10-CM | POA: Diagnosis not present

## 2018-10-06 DIAGNOSIS — I10 Essential (primary) hypertension: Secondary | ICD-10-CM | POA: Diagnosis not present

## 2018-10-07 DIAGNOSIS — I10 Essential (primary) hypertension: Secondary | ICD-10-CM | POA: Diagnosis not present

## 2018-10-08 DIAGNOSIS — I10 Essential (primary) hypertension: Secondary | ICD-10-CM | POA: Diagnosis not present

## 2018-10-09 DIAGNOSIS — I10 Essential (primary) hypertension: Secondary | ICD-10-CM | POA: Diagnosis not present

## 2018-10-10 DIAGNOSIS — I10 Essential (primary) hypertension: Secondary | ICD-10-CM | POA: Diagnosis not present

## 2018-10-11 DIAGNOSIS — I10 Essential (primary) hypertension: Secondary | ICD-10-CM | POA: Diagnosis not present

## 2018-10-12 DIAGNOSIS — I69351 Hemiplegia and hemiparesis following cerebral infarction affecting right dominant side: Secondary | ICD-10-CM | POA: Diagnosis not present

## 2018-10-12 DIAGNOSIS — I10 Essential (primary) hypertension: Secondary | ICD-10-CM | POA: Diagnosis not present

## 2018-10-13 DIAGNOSIS — I10 Essential (primary) hypertension: Secondary | ICD-10-CM | POA: Diagnosis not present

## 2018-10-14 DIAGNOSIS — I10 Essential (primary) hypertension: Secondary | ICD-10-CM | POA: Diagnosis not present

## 2018-10-15 DIAGNOSIS — I10 Essential (primary) hypertension: Secondary | ICD-10-CM | POA: Diagnosis not present

## 2018-10-16 DIAGNOSIS — I10 Essential (primary) hypertension: Secondary | ICD-10-CM | POA: Diagnosis not present

## 2018-10-17 DIAGNOSIS — I10 Essential (primary) hypertension: Secondary | ICD-10-CM | POA: Diagnosis not present

## 2018-10-18 DIAGNOSIS — I10 Essential (primary) hypertension: Secondary | ICD-10-CM | POA: Diagnosis not present

## 2018-10-19 DIAGNOSIS — I10 Essential (primary) hypertension: Secondary | ICD-10-CM | POA: Diagnosis not present

## 2018-10-20 DIAGNOSIS — I10 Essential (primary) hypertension: Secondary | ICD-10-CM | POA: Diagnosis not present

## 2018-10-21 DIAGNOSIS — I10 Essential (primary) hypertension: Secondary | ICD-10-CM | POA: Diagnosis not present

## 2018-10-22 DIAGNOSIS — I10 Essential (primary) hypertension: Secondary | ICD-10-CM | POA: Diagnosis not present

## 2018-10-23 DIAGNOSIS — I10 Essential (primary) hypertension: Secondary | ICD-10-CM | POA: Diagnosis not present

## 2018-10-23 DIAGNOSIS — I69351 Hemiplegia and hemiparesis following cerebral infarction affecting right dominant side: Secondary | ICD-10-CM | POA: Diagnosis not present

## 2018-10-24 DIAGNOSIS — I10 Essential (primary) hypertension: Secondary | ICD-10-CM | POA: Diagnosis not present

## 2018-10-25 DIAGNOSIS — I10 Essential (primary) hypertension: Secondary | ICD-10-CM | POA: Diagnosis not present

## 2018-10-26 DIAGNOSIS — I10 Essential (primary) hypertension: Secondary | ICD-10-CM | POA: Diagnosis not present

## 2018-10-27 DIAGNOSIS — I10 Essential (primary) hypertension: Secondary | ICD-10-CM | POA: Diagnosis not present

## 2018-10-27 DIAGNOSIS — I69351 Hemiplegia and hemiparesis following cerebral infarction affecting right dominant side: Secondary | ICD-10-CM | POA: Diagnosis not present

## 2018-10-27 DIAGNOSIS — M6281 Muscle weakness (generalized): Secondary | ICD-10-CM | POA: Diagnosis not present

## 2018-10-27 DIAGNOSIS — R278 Other lack of coordination: Secondary | ICD-10-CM | POA: Diagnosis not present

## 2018-10-27 DIAGNOSIS — R261 Paralytic gait: Secondary | ICD-10-CM | POA: Diagnosis not present

## 2018-10-28 DIAGNOSIS — I10 Essential (primary) hypertension: Secondary | ICD-10-CM | POA: Diagnosis not present

## 2018-10-29 DIAGNOSIS — G8190 Hemiplegia, unspecified affecting unspecified side: Secondary | ICD-10-CM | POA: Diagnosis not present

## 2018-10-29 DIAGNOSIS — I5020 Unspecified systolic (congestive) heart failure: Secondary | ICD-10-CM | POA: Diagnosis not present

## 2018-10-29 DIAGNOSIS — I10 Essential (primary) hypertension: Secondary | ICD-10-CM | POA: Diagnosis not present

## 2018-10-29 DIAGNOSIS — F339 Major depressive disorder, recurrent, unspecified: Secondary | ICD-10-CM | POA: Diagnosis not present

## 2018-10-29 DIAGNOSIS — I679 Cerebrovascular disease, unspecified: Secondary | ICD-10-CM | POA: Diagnosis not present

## 2018-10-29 DIAGNOSIS — Z79899 Other long term (current) drug therapy: Secondary | ICD-10-CM | POA: Diagnosis not present

## 2018-10-29 DIAGNOSIS — M25511 Pain in right shoulder: Secondary | ICD-10-CM | POA: Diagnosis not present

## 2018-10-30 DIAGNOSIS — R261 Paralytic gait: Secondary | ICD-10-CM | POA: Diagnosis not present

## 2018-10-30 DIAGNOSIS — I69351 Hemiplegia and hemiparesis following cerebral infarction affecting right dominant side: Secondary | ICD-10-CM | POA: Diagnosis not present

## 2018-10-30 DIAGNOSIS — I10 Essential (primary) hypertension: Secondary | ICD-10-CM | POA: Diagnosis not present

## 2018-10-30 DIAGNOSIS — R278 Other lack of coordination: Secondary | ICD-10-CM | POA: Diagnosis not present

## 2018-10-30 DIAGNOSIS — M6281 Muscle weakness (generalized): Secondary | ICD-10-CM | POA: Diagnosis not present

## 2018-10-31 DIAGNOSIS — I10 Essential (primary) hypertension: Secondary | ICD-10-CM | POA: Diagnosis not present

## 2018-11-01 DIAGNOSIS — I10 Essential (primary) hypertension: Secondary | ICD-10-CM | POA: Diagnosis not present

## 2018-11-02 DIAGNOSIS — I10 Essential (primary) hypertension: Secondary | ICD-10-CM | POA: Diagnosis not present

## 2018-11-03 DIAGNOSIS — I10 Essential (primary) hypertension: Secondary | ICD-10-CM | POA: Diagnosis not present

## 2018-11-03 DIAGNOSIS — I69351 Hemiplegia and hemiparesis following cerebral infarction affecting right dominant side: Secondary | ICD-10-CM | POA: Diagnosis not present

## 2018-11-04 DIAGNOSIS — Z20828 Contact with and (suspected) exposure to other viral communicable diseases: Secondary | ICD-10-CM | POA: Diagnosis not present

## 2018-11-04 DIAGNOSIS — I10 Essential (primary) hypertension: Secondary | ICD-10-CM | POA: Diagnosis not present

## 2018-11-05 DIAGNOSIS — I10 Essential (primary) hypertension: Secondary | ICD-10-CM | POA: Diagnosis not present

## 2018-11-06 DIAGNOSIS — I10 Essential (primary) hypertension: Secondary | ICD-10-CM | POA: Diagnosis not present

## 2018-11-06 DIAGNOSIS — Z79899 Other long term (current) drug therapy: Secondary | ICD-10-CM | POA: Diagnosis not present

## 2018-11-07 DIAGNOSIS — I10 Essential (primary) hypertension: Secondary | ICD-10-CM | POA: Diagnosis not present

## 2018-11-08 DIAGNOSIS — I10 Essential (primary) hypertension: Secondary | ICD-10-CM | POA: Diagnosis not present

## 2018-11-09 DIAGNOSIS — I10 Essential (primary) hypertension: Secondary | ICD-10-CM | POA: Diagnosis not present

## 2018-11-10 DIAGNOSIS — I10 Essential (primary) hypertension: Secondary | ICD-10-CM | POA: Diagnosis not present

## 2018-11-11 DIAGNOSIS — I10 Essential (primary) hypertension: Secondary | ICD-10-CM | POA: Diagnosis not present

## 2018-11-11 DIAGNOSIS — Z20828 Contact with and (suspected) exposure to other viral communicable diseases: Secondary | ICD-10-CM | POA: Diagnosis not present

## 2018-11-12 DIAGNOSIS — I10 Essential (primary) hypertension: Secondary | ICD-10-CM | POA: Diagnosis not present

## 2018-11-13 DIAGNOSIS — Z79899 Other long term (current) drug therapy: Secondary | ICD-10-CM | POA: Diagnosis not present

## 2018-11-13 DIAGNOSIS — I10 Essential (primary) hypertension: Secondary | ICD-10-CM | POA: Diagnosis not present

## 2018-11-14 DIAGNOSIS — I10 Essential (primary) hypertension: Secondary | ICD-10-CM | POA: Diagnosis not present

## 2018-11-15 DIAGNOSIS — I10 Essential (primary) hypertension: Secondary | ICD-10-CM | POA: Diagnosis not present

## 2018-11-16 DIAGNOSIS — I10 Essential (primary) hypertension: Secondary | ICD-10-CM | POA: Diagnosis not present

## 2018-11-17 DIAGNOSIS — I10 Essential (primary) hypertension: Secondary | ICD-10-CM | POA: Diagnosis not present

## 2018-11-17 DIAGNOSIS — Z20828 Contact with and (suspected) exposure to other viral communicable diseases: Secondary | ICD-10-CM | POA: Diagnosis not present

## 2018-11-18 DIAGNOSIS — I10 Essential (primary) hypertension: Secondary | ICD-10-CM | POA: Diagnosis not present

## 2018-11-19 DIAGNOSIS — I10 Essential (primary) hypertension: Secondary | ICD-10-CM | POA: Diagnosis not present

## 2018-11-20 DIAGNOSIS — I10 Essential (primary) hypertension: Secondary | ICD-10-CM | POA: Diagnosis not present

## 2018-11-21 DIAGNOSIS — I10 Essential (primary) hypertension: Secondary | ICD-10-CM | POA: Diagnosis not present

## 2018-11-22 DIAGNOSIS — I10 Essential (primary) hypertension: Secondary | ICD-10-CM | POA: Diagnosis not present

## 2018-11-23 DIAGNOSIS — I10 Essential (primary) hypertension: Secondary | ICD-10-CM | POA: Diagnosis not present

## 2018-11-24 DIAGNOSIS — I10 Essential (primary) hypertension: Secondary | ICD-10-CM | POA: Diagnosis not present

## 2018-11-24 DIAGNOSIS — Z20828 Contact with and (suspected) exposure to other viral communicable diseases: Secondary | ICD-10-CM | POA: Diagnosis not present

## 2018-11-25 DIAGNOSIS — F339 Major depressive disorder, recurrent, unspecified: Secondary | ICD-10-CM | POA: Diagnosis not present

## 2018-11-25 DIAGNOSIS — G8190 Hemiplegia, unspecified affecting unspecified side: Secondary | ICD-10-CM | POA: Diagnosis not present

## 2018-11-25 DIAGNOSIS — I679 Cerebrovascular disease, unspecified: Secondary | ICD-10-CM | POA: Diagnosis not present

## 2018-11-25 DIAGNOSIS — G894 Chronic pain syndrome: Secondary | ICD-10-CM | POA: Diagnosis not present

## 2018-11-25 DIAGNOSIS — I5020 Unspecified systolic (congestive) heart failure: Secondary | ICD-10-CM | POA: Diagnosis not present

## 2018-11-25 DIAGNOSIS — Z79899 Other long term (current) drug therapy: Secondary | ICD-10-CM | POA: Diagnosis not present

## 2018-11-25 DIAGNOSIS — I10 Essential (primary) hypertension: Secondary | ICD-10-CM | POA: Diagnosis not present

## 2018-11-26 DIAGNOSIS — I10 Essential (primary) hypertension: Secondary | ICD-10-CM | POA: Diagnosis not present

## 2018-11-27 DIAGNOSIS — I10 Essential (primary) hypertension: Secondary | ICD-10-CM | POA: Diagnosis not present

## 2018-11-28 DIAGNOSIS — I10 Essential (primary) hypertension: Secondary | ICD-10-CM | POA: Diagnosis not present

## 2018-11-29 DIAGNOSIS — I10 Essential (primary) hypertension: Secondary | ICD-10-CM | POA: Diagnosis not present

## 2018-11-30 DIAGNOSIS — I69351 Hemiplegia and hemiparesis following cerebral infarction affecting right dominant side: Secondary | ICD-10-CM | POA: Diagnosis not present

## 2018-11-30 DIAGNOSIS — R261 Paralytic gait: Secondary | ICD-10-CM | POA: Diagnosis not present

## 2018-11-30 DIAGNOSIS — M6281 Muscle weakness (generalized): Secondary | ICD-10-CM | POA: Diagnosis not present

## 2018-11-30 DIAGNOSIS — R278 Other lack of coordination: Secondary | ICD-10-CM | POA: Diagnosis not present

## 2018-11-30 DIAGNOSIS — I10 Essential (primary) hypertension: Secondary | ICD-10-CM | POA: Diagnosis not present

## 2018-12-01 DIAGNOSIS — Z20828 Contact with and (suspected) exposure to other viral communicable diseases: Secondary | ICD-10-CM | POA: Diagnosis not present

## 2018-12-01 DIAGNOSIS — I10 Essential (primary) hypertension: Secondary | ICD-10-CM | POA: Diagnosis not present

## 2018-12-02 DIAGNOSIS — I10 Essential (primary) hypertension: Secondary | ICD-10-CM | POA: Diagnosis not present

## 2018-12-03 DIAGNOSIS — I10 Essential (primary) hypertension: Secondary | ICD-10-CM | POA: Diagnosis not present

## 2018-12-04 DIAGNOSIS — I10 Essential (primary) hypertension: Secondary | ICD-10-CM | POA: Diagnosis not present

## 2018-12-05 DIAGNOSIS — I10 Essential (primary) hypertension: Secondary | ICD-10-CM | POA: Diagnosis not present

## 2018-12-06 DIAGNOSIS — I10 Essential (primary) hypertension: Secondary | ICD-10-CM | POA: Diagnosis not present

## 2018-12-07 DIAGNOSIS — I10 Essential (primary) hypertension: Secondary | ICD-10-CM | POA: Diagnosis not present

## 2018-12-08 DIAGNOSIS — Z20828 Contact with and (suspected) exposure to other viral communicable diseases: Secondary | ICD-10-CM | POA: Diagnosis not present

## 2018-12-08 DIAGNOSIS — I10 Essential (primary) hypertension: Secondary | ICD-10-CM | POA: Diagnosis not present

## 2018-12-09 DIAGNOSIS — I10 Essential (primary) hypertension: Secondary | ICD-10-CM | POA: Diagnosis not present

## 2018-12-10 DIAGNOSIS — I10 Essential (primary) hypertension: Secondary | ICD-10-CM | POA: Diagnosis not present

## 2018-12-11 DIAGNOSIS — I10 Essential (primary) hypertension: Secondary | ICD-10-CM | POA: Diagnosis not present

## 2018-12-12 DIAGNOSIS — I10 Essential (primary) hypertension: Secondary | ICD-10-CM | POA: Diagnosis not present

## 2018-12-13 DIAGNOSIS — I10 Essential (primary) hypertension: Secondary | ICD-10-CM | POA: Diagnosis not present

## 2018-12-14 DIAGNOSIS — I10 Essential (primary) hypertension: Secondary | ICD-10-CM | POA: Diagnosis not present

## 2018-12-15 DIAGNOSIS — I10 Essential (primary) hypertension: Secondary | ICD-10-CM | POA: Diagnosis not present

## 2018-12-16 DIAGNOSIS — I10 Essential (primary) hypertension: Secondary | ICD-10-CM | POA: Diagnosis not present

## 2018-12-17 DIAGNOSIS — I10 Essential (primary) hypertension: Secondary | ICD-10-CM | POA: Diagnosis not present

## 2018-12-18 DIAGNOSIS — I10 Essential (primary) hypertension: Secondary | ICD-10-CM | POA: Diagnosis not present

## 2018-12-19 DIAGNOSIS — I10 Essential (primary) hypertension: Secondary | ICD-10-CM | POA: Diagnosis not present

## 2018-12-20 DIAGNOSIS — I10 Essential (primary) hypertension: Secondary | ICD-10-CM | POA: Diagnosis not present

## 2018-12-21 DIAGNOSIS — I10 Essential (primary) hypertension: Secondary | ICD-10-CM | POA: Diagnosis not present

## 2018-12-22 DIAGNOSIS — I10 Essential (primary) hypertension: Secondary | ICD-10-CM | POA: Diagnosis not present

## 2018-12-23 DIAGNOSIS — G8190 Hemiplegia, unspecified affecting unspecified side: Secondary | ICD-10-CM | POA: Diagnosis not present

## 2018-12-23 DIAGNOSIS — I10 Essential (primary) hypertension: Secondary | ICD-10-CM | POA: Diagnosis not present

## 2018-12-23 DIAGNOSIS — I679 Cerebrovascular disease, unspecified: Secondary | ICD-10-CM | POA: Diagnosis not present

## 2018-12-23 DIAGNOSIS — I129 Hypertensive chronic kidney disease with stage 1 through stage 4 chronic kidney disease, or unspecified chronic kidney disease: Secondary | ICD-10-CM | POA: Diagnosis not present

## 2018-12-23 DIAGNOSIS — Z79899 Other long term (current) drug therapy: Secondary | ICD-10-CM | POA: Diagnosis not present

## 2018-12-23 DIAGNOSIS — F339 Major depressive disorder, recurrent, unspecified: Secondary | ICD-10-CM | POA: Diagnosis not present

## 2018-12-23 DIAGNOSIS — M62838 Other muscle spasm: Secondary | ICD-10-CM | POA: Diagnosis not present

## 2018-12-23 DIAGNOSIS — N183 Chronic kidney disease, stage 3 unspecified: Secondary | ICD-10-CM | POA: Diagnosis not present

## 2018-12-23 DIAGNOSIS — M25511 Pain in right shoulder: Secondary | ICD-10-CM | POA: Diagnosis not present

## 2018-12-24 DIAGNOSIS — I10 Essential (primary) hypertension: Secondary | ICD-10-CM | POA: Diagnosis not present

## 2018-12-25 DIAGNOSIS — I10 Essential (primary) hypertension: Secondary | ICD-10-CM | POA: Diagnosis not present

## 2018-12-26 DIAGNOSIS — I10 Essential (primary) hypertension: Secondary | ICD-10-CM | POA: Diagnosis not present

## 2018-12-27 DIAGNOSIS — I10 Essential (primary) hypertension: Secondary | ICD-10-CM | POA: Diagnosis not present

## 2018-12-28 DIAGNOSIS — I10 Essential (primary) hypertension: Secondary | ICD-10-CM | POA: Diagnosis not present

## 2018-12-29 DIAGNOSIS — I10 Essential (primary) hypertension: Secondary | ICD-10-CM | POA: Diagnosis not present

## 2018-12-30 DIAGNOSIS — M6281 Muscle weakness (generalized): Secondary | ICD-10-CM | POA: Diagnosis not present

## 2018-12-30 DIAGNOSIS — I10 Essential (primary) hypertension: Secondary | ICD-10-CM | POA: Diagnosis not present

## 2018-12-30 DIAGNOSIS — R261 Paralytic gait: Secondary | ICD-10-CM | POA: Diagnosis not present

## 2018-12-30 DIAGNOSIS — I69351 Hemiplegia and hemiparesis following cerebral infarction affecting right dominant side: Secondary | ICD-10-CM | POA: Diagnosis not present

## 2018-12-30 DIAGNOSIS — R278 Other lack of coordination: Secondary | ICD-10-CM | POA: Diagnosis not present

## 2018-12-31 DIAGNOSIS — I10 Essential (primary) hypertension: Secondary | ICD-10-CM | POA: Diagnosis not present

## 2019-01-01 DIAGNOSIS — I10 Essential (primary) hypertension: Secondary | ICD-10-CM | POA: Diagnosis not present

## 2019-01-02 DIAGNOSIS — I10 Essential (primary) hypertension: Secondary | ICD-10-CM | POA: Diagnosis not present

## 2019-01-03 DIAGNOSIS — I10 Essential (primary) hypertension: Secondary | ICD-10-CM | POA: Diagnosis not present

## 2019-01-04 DIAGNOSIS — I10 Essential (primary) hypertension: Secondary | ICD-10-CM | POA: Diagnosis not present

## 2019-01-05 DIAGNOSIS — I10 Essential (primary) hypertension: Secondary | ICD-10-CM | POA: Diagnosis not present

## 2019-01-06 DIAGNOSIS — I10 Essential (primary) hypertension: Secondary | ICD-10-CM | POA: Diagnosis not present

## 2019-01-07 DIAGNOSIS — I10 Essential (primary) hypertension: Secondary | ICD-10-CM | POA: Diagnosis not present

## 2019-01-08 DIAGNOSIS — I10 Essential (primary) hypertension: Secondary | ICD-10-CM | POA: Diagnosis not present

## 2019-01-09 DIAGNOSIS — I10 Essential (primary) hypertension: Secondary | ICD-10-CM | POA: Diagnosis not present

## 2019-01-10 DIAGNOSIS — I10 Essential (primary) hypertension: Secondary | ICD-10-CM | POA: Diagnosis not present

## 2019-01-11 DIAGNOSIS — I10 Essential (primary) hypertension: Secondary | ICD-10-CM | POA: Diagnosis not present

## 2019-01-12 DIAGNOSIS — I10 Essential (primary) hypertension: Secondary | ICD-10-CM | POA: Diagnosis not present

## 2019-01-13 DIAGNOSIS — I10 Essential (primary) hypertension: Secondary | ICD-10-CM | POA: Diagnosis not present

## 2019-01-14 DIAGNOSIS — Z20828 Contact with and (suspected) exposure to other viral communicable diseases: Secondary | ICD-10-CM | POA: Diagnosis not present

## 2019-01-14 DIAGNOSIS — I10 Essential (primary) hypertension: Secondary | ICD-10-CM | POA: Diagnosis not present

## 2019-01-15 DIAGNOSIS — I10 Essential (primary) hypertension: Secondary | ICD-10-CM | POA: Diagnosis not present

## 2019-01-16 DIAGNOSIS — I10 Essential (primary) hypertension: Secondary | ICD-10-CM | POA: Diagnosis not present

## 2019-01-17 DIAGNOSIS — I10 Essential (primary) hypertension: Secondary | ICD-10-CM | POA: Diagnosis not present

## 2019-01-18 DIAGNOSIS — I10 Essential (primary) hypertension: Secondary | ICD-10-CM | POA: Diagnosis not present

## 2019-01-19 DIAGNOSIS — I10 Essential (primary) hypertension: Secondary | ICD-10-CM | POA: Diagnosis not present

## 2019-01-20 DIAGNOSIS — G8190 Hemiplegia, unspecified affecting unspecified side: Secondary | ICD-10-CM | POA: Diagnosis not present

## 2019-01-20 DIAGNOSIS — I10 Essential (primary) hypertension: Secondary | ICD-10-CM | POA: Diagnosis not present

## 2019-01-20 DIAGNOSIS — M25511 Pain in right shoulder: Secondary | ICD-10-CM | POA: Diagnosis not present

## 2019-01-20 DIAGNOSIS — Z20828 Contact with and (suspected) exposure to other viral communicable diseases: Secondary | ICD-10-CM | POA: Diagnosis not present

## 2019-01-20 DIAGNOSIS — L299 Pruritus, unspecified: Secondary | ICD-10-CM | POA: Diagnosis not present

## 2019-01-20 DIAGNOSIS — Z79899 Other long term (current) drug therapy: Secondary | ICD-10-CM | POA: Diagnosis not present

## 2019-01-20 DIAGNOSIS — I679 Cerebrovascular disease, unspecified: Secondary | ICD-10-CM | POA: Diagnosis not present

## 2019-01-20 DIAGNOSIS — F339 Major depressive disorder, recurrent, unspecified: Secondary | ICD-10-CM | POA: Diagnosis not present

## 2019-01-21 DIAGNOSIS — I10 Essential (primary) hypertension: Secondary | ICD-10-CM | POA: Diagnosis not present

## 2019-01-22 DIAGNOSIS — I10 Essential (primary) hypertension: Secondary | ICD-10-CM | POA: Diagnosis not present

## 2019-01-23 DIAGNOSIS — I10 Essential (primary) hypertension: Secondary | ICD-10-CM | POA: Diagnosis not present

## 2019-01-24 DIAGNOSIS — I10 Essential (primary) hypertension: Secondary | ICD-10-CM | POA: Diagnosis not present

## 2019-01-25 DIAGNOSIS — I10 Essential (primary) hypertension: Secondary | ICD-10-CM | POA: Diagnosis not present

## 2019-01-26 DIAGNOSIS — I10 Essential (primary) hypertension: Secondary | ICD-10-CM | POA: Diagnosis not present

## 2019-01-27 DIAGNOSIS — Z20828 Contact with and (suspected) exposure to other viral communicable diseases: Secondary | ICD-10-CM | POA: Diagnosis not present

## 2019-01-27 DIAGNOSIS — I10 Essential (primary) hypertension: Secondary | ICD-10-CM | POA: Diagnosis not present

## 2019-01-28 DIAGNOSIS — I10 Essential (primary) hypertension: Secondary | ICD-10-CM | POA: Diagnosis not present

## 2019-01-29 DIAGNOSIS — I10 Essential (primary) hypertension: Secondary | ICD-10-CM | POA: Diagnosis not present

## 2019-01-30 DIAGNOSIS — I10 Essential (primary) hypertension: Secondary | ICD-10-CM | POA: Diagnosis not present

## 2019-01-31 DIAGNOSIS — I10 Essential (primary) hypertension: Secondary | ICD-10-CM | POA: Diagnosis not present

## 2019-02-01 DIAGNOSIS — I10 Essential (primary) hypertension: Secondary | ICD-10-CM | POA: Diagnosis not present

## 2019-02-02 DIAGNOSIS — Z20828 Contact with and (suspected) exposure to other viral communicable diseases: Secondary | ICD-10-CM | POA: Diagnosis not present

## 2019-02-02 DIAGNOSIS — I10 Essential (primary) hypertension: Secondary | ICD-10-CM | POA: Diagnosis not present

## 2019-02-03 DIAGNOSIS — I10 Essential (primary) hypertension: Secondary | ICD-10-CM | POA: Diagnosis not present

## 2019-02-04 DIAGNOSIS — I10 Essential (primary) hypertension: Secondary | ICD-10-CM | POA: Diagnosis not present

## 2019-02-05 DIAGNOSIS — I10 Essential (primary) hypertension: Secondary | ICD-10-CM | POA: Diagnosis not present

## 2019-02-06 DIAGNOSIS — I10 Essential (primary) hypertension: Secondary | ICD-10-CM | POA: Diagnosis not present

## 2019-02-07 DIAGNOSIS — I10 Essential (primary) hypertension: Secondary | ICD-10-CM | POA: Diagnosis not present

## 2019-02-08 DIAGNOSIS — I10 Essential (primary) hypertension: Secondary | ICD-10-CM | POA: Diagnosis not present

## 2019-02-09 DIAGNOSIS — Z20828 Contact with and (suspected) exposure to other viral communicable diseases: Secondary | ICD-10-CM | POA: Diagnosis not present

## 2019-02-09 DIAGNOSIS — I10 Essential (primary) hypertension: Secondary | ICD-10-CM | POA: Diagnosis not present

## 2019-02-10 DIAGNOSIS — I10 Essential (primary) hypertension: Secondary | ICD-10-CM | POA: Diagnosis not present

## 2019-02-11 DIAGNOSIS — I10 Essential (primary) hypertension: Secondary | ICD-10-CM | POA: Diagnosis not present

## 2019-02-12 DIAGNOSIS — I10 Essential (primary) hypertension: Secondary | ICD-10-CM | POA: Diagnosis not present

## 2019-02-13 DIAGNOSIS — I10 Essential (primary) hypertension: Secondary | ICD-10-CM | POA: Diagnosis not present

## 2019-02-14 DIAGNOSIS — I10 Essential (primary) hypertension: Secondary | ICD-10-CM | POA: Diagnosis not present

## 2019-02-15 DIAGNOSIS — I10 Essential (primary) hypertension: Secondary | ICD-10-CM | POA: Diagnosis not present

## 2019-02-16 DIAGNOSIS — Z20828 Contact with and (suspected) exposure to other viral communicable diseases: Secondary | ICD-10-CM | POA: Diagnosis not present

## 2019-02-16 DIAGNOSIS — I10 Essential (primary) hypertension: Secondary | ICD-10-CM | POA: Diagnosis not present

## 2019-02-17 DIAGNOSIS — F339 Major depressive disorder, recurrent, unspecified: Secondary | ICD-10-CM | POA: Diagnosis not present

## 2019-02-17 DIAGNOSIS — F5109 Other insomnia not due to a substance or known physiological condition: Secondary | ICD-10-CM | POA: Diagnosis not present

## 2019-02-17 DIAGNOSIS — N1831 Chronic kidney disease, stage 3a: Secondary | ICD-10-CM | POA: Diagnosis not present

## 2019-02-17 DIAGNOSIS — M25511 Pain in right shoulder: Secondary | ICD-10-CM | POA: Diagnosis not present

## 2019-02-17 DIAGNOSIS — Z7982 Long term (current) use of aspirin: Secondary | ICD-10-CM | POA: Diagnosis not present

## 2019-02-17 DIAGNOSIS — I10 Essential (primary) hypertension: Secondary | ICD-10-CM | POA: Diagnosis not present

## 2019-02-17 DIAGNOSIS — G8190 Hemiplegia, unspecified affecting unspecified side: Secondary | ICD-10-CM | POA: Diagnosis not present

## 2019-02-17 DIAGNOSIS — L299 Pruritus, unspecified: Secondary | ICD-10-CM | POA: Diagnosis not present

## 2019-02-17 DIAGNOSIS — I679 Cerebrovascular disease, unspecified: Secondary | ICD-10-CM | POA: Diagnosis not present

## 2019-02-17 DIAGNOSIS — I129 Hypertensive chronic kidney disease with stage 1 through stage 4 chronic kidney disease, or unspecified chronic kidney disease: Secondary | ICD-10-CM | POA: Diagnosis not present

## 2019-02-18 DIAGNOSIS — I10 Essential (primary) hypertension: Secondary | ICD-10-CM | POA: Diagnosis not present

## 2019-02-19 DIAGNOSIS — I10 Essential (primary) hypertension: Secondary | ICD-10-CM | POA: Diagnosis not present

## 2019-02-20 DIAGNOSIS — I10 Essential (primary) hypertension: Secondary | ICD-10-CM | POA: Diagnosis not present

## 2019-02-21 DIAGNOSIS — I10 Essential (primary) hypertension: Secondary | ICD-10-CM | POA: Diagnosis not present

## 2019-02-22 DIAGNOSIS — I10 Essential (primary) hypertension: Secondary | ICD-10-CM | POA: Diagnosis not present

## 2019-02-23 DIAGNOSIS — I10 Essential (primary) hypertension: Secondary | ICD-10-CM | POA: Diagnosis not present

## 2019-02-23 DIAGNOSIS — Z20828 Contact with and (suspected) exposure to other viral communicable diseases: Secondary | ICD-10-CM | POA: Diagnosis not present

## 2019-02-24 DIAGNOSIS — I10 Essential (primary) hypertension: Secondary | ICD-10-CM | POA: Diagnosis not present

## 2019-02-25 DIAGNOSIS — I10 Essential (primary) hypertension: Secondary | ICD-10-CM | POA: Diagnosis not present

## 2019-02-26 DIAGNOSIS — I10 Essential (primary) hypertension: Secondary | ICD-10-CM | POA: Diagnosis not present

## 2019-02-27 DIAGNOSIS — I10 Essential (primary) hypertension: Secondary | ICD-10-CM | POA: Diagnosis not present

## 2019-02-28 DIAGNOSIS — I10 Essential (primary) hypertension: Secondary | ICD-10-CM | POA: Diagnosis not present

## 2019-03-01 DIAGNOSIS — I10 Essential (primary) hypertension: Secondary | ICD-10-CM | POA: Diagnosis not present

## 2019-03-02 DIAGNOSIS — I10 Essential (primary) hypertension: Secondary | ICD-10-CM | POA: Diagnosis not present

## 2019-03-02 DIAGNOSIS — Z20828 Contact with and (suspected) exposure to other viral communicable diseases: Secondary | ICD-10-CM | POA: Diagnosis not present

## 2019-03-03 DIAGNOSIS — I10 Essential (primary) hypertension: Secondary | ICD-10-CM | POA: Diagnosis not present

## 2019-03-04 DIAGNOSIS — I10 Essential (primary) hypertension: Secondary | ICD-10-CM | POA: Diagnosis not present

## 2019-03-05 DIAGNOSIS — I10 Essential (primary) hypertension: Secondary | ICD-10-CM | POA: Diagnosis not present

## 2019-03-06 DIAGNOSIS — I10 Essential (primary) hypertension: Secondary | ICD-10-CM | POA: Diagnosis not present

## 2019-03-07 DIAGNOSIS — I10 Essential (primary) hypertension: Secondary | ICD-10-CM | POA: Diagnosis not present

## 2019-03-08 DIAGNOSIS — I10 Essential (primary) hypertension: Secondary | ICD-10-CM | POA: Diagnosis not present

## 2019-03-09 DIAGNOSIS — I10 Essential (primary) hypertension: Secondary | ICD-10-CM | POA: Diagnosis not present

## 2019-03-09 DIAGNOSIS — Z20828 Contact with and (suspected) exposure to other viral communicable diseases: Secondary | ICD-10-CM | POA: Diagnosis not present

## 2019-03-10 DIAGNOSIS — I10 Essential (primary) hypertension: Secondary | ICD-10-CM | POA: Diagnosis not present

## 2019-03-11 DIAGNOSIS — I10 Essential (primary) hypertension: Secondary | ICD-10-CM | POA: Diagnosis not present

## 2019-03-12 DIAGNOSIS — Z20828 Contact with and (suspected) exposure to other viral communicable diseases: Secondary | ICD-10-CM | POA: Diagnosis not present

## 2019-03-12 DIAGNOSIS — Z79899 Other long term (current) drug therapy: Secondary | ICD-10-CM | POA: Diagnosis not present

## 2019-03-12 DIAGNOSIS — I10 Essential (primary) hypertension: Secondary | ICD-10-CM | POA: Diagnosis not present

## 2019-03-13 DIAGNOSIS — I10 Essential (primary) hypertension: Secondary | ICD-10-CM | POA: Diagnosis not present

## 2019-03-14 DIAGNOSIS — I10 Essential (primary) hypertension: Secondary | ICD-10-CM | POA: Diagnosis not present

## 2019-03-15 DIAGNOSIS — I10 Essential (primary) hypertension: Secondary | ICD-10-CM | POA: Diagnosis not present

## 2019-03-16 DIAGNOSIS — I10 Essential (primary) hypertension: Secondary | ICD-10-CM | POA: Diagnosis not present

## 2019-03-17 DIAGNOSIS — F339 Major depressive disorder, recurrent, unspecified: Secondary | ICD-10-CM | POA: Diagnosis not present

## 2019-03-17 DIAGNOSIS — M25511 Pain in right shoulder: Secondary | ICD-10-CM | POA: Diagnosis not present

## 2019-03-17 DIAGNOSIS — I10 Essential (primary) hypertension: Secondary | ICD-10-CM | POA: Diagnosis not present

## 2019-03-17 DIAGNOSIS — G8190 Hemiplegia, unspecified affecting unspecified side: Secondary | ICD-10-CM | POA: Diagnosis not present

## 2019-03-17 DIAGNOSIS — I679 Cerebrovascular disease, unspecified: Secondary | ICD-10-CM | POA: Diagnosis not present

## 2019-03-17 DIAGNOSIS — I5022 Chronic systolic (congestive) heart failure: Secondary | ICD-10-CM | POA: Diagnosis not present

## 2019-03-18 DIAGNOSIS — I10 Essential (primary) hypertension: Secondary | ICD-10-CM | POA: Diagnosis not present

## 2019-03-19 DIAGNOSIS — I10 Essential (primary) hypertension: Secondary | ICD-10-CM | POA: Diagnosis not present

## 2019-03-20 DIAGNOSIS — I10 Essential (primary) hypertension: Secondary | ICD-10-CM | POA: Diagnosis not present

## 2019-03-21 DIAGNOSIS — I10 Essential (primary) hypertension: Secondary | ICD-10-CM | POA: Diagnosis not present

## 2019-03-22 DIAGNOSIS — I10 Essential (primary) hypertension: Secondary | ICD-10-CM | POA: Diagnosis not present

## 2019-03-23 DIAGNOSIS — I10 Essential (primary) hypertension: Secondary | ICD-10-CM | POA: Diagnosis not present

## 2019-03-24 DIAGNOSIS — I10 Essential (primary) hypertension: Secondary | ICD-10-CM | POA: Diagnosis not present

## 2019-03-25 DIAGNOSIS — I10 Essential (primary) hypertension: Secondary | ICD-10-CM | POA: Diagnosis not present

## 2019-03-26 DIAGNOSIS — I10 Essential (primary) hypertension: Secondary | ICD-10-CM | POA: Diagnosis not present

## 2019-03-27 DIAGNOSIS — I10 Essential (primary) hypertension: Secondary | ICD-10-CM | POA: Diagnosis not present

## 2019-03-28 DIAGNOSIS — I10 Essential (primary) hypertension: Secondary | ICD-10-CM | POA: Diagnosis not present

## 2019-03-29 DIAGNOSIS — I10 Essential (primary) hypertension: Secondary | ICD-10-CM | POA: Diagnosis not present

## 2019-03-30 DIAGNOSIS — I10 Essential (primary) hypertension: Secondary | ICD-10-CM | POA: Diagnosis not present

## 2019-03-31 DIAGNOSIS — I10 Essential (primary) hypertension: Secondary | ICD-10-CM | POA: Diagnosis not present

## 2019-04-01 DIAGNOSIS — I10 Essential (primary) hypertension: Secondary | ICD-10-CM | POA: Diagnosis not present

## 2019-04-02 DIAGNOSIS — I10 Essential (primary) hypertension: Secondary | ICD-10-CM | POA: Diagnosis not present

## 2019-04-03 DIAGNOSIS — I10 Essential (primary) hypertension: Secondary | ICD-10-CM | POA: Diagnosis not present

## 2019-04-04 DIAGNOSIS — I10 Essential (primary) hypertension: Secondary | ICD-10-CM | POA: Diagnosis not present

## 2019-04-05 DIAGNOSIS — I10 Essential (primary) hypertension: Secondary | ICD-10-CM | POA: Diagnosis not present

## 2019-04-06 DIAGNOSIS — I10 Essential (primary) hypertension: Secondary | ICD-10-CM | POA: Diagnosis not present

## 2019-04-07 DIAGNOSIS — I10 Essential (primary) hypertension: Secondary | ICD-10-CM | POA: Diagnosis not present

## 2019-04-08 DIAGNOSIS — I10 Essential (primary) hypertension: Secondary | ICD-10-CM | POA: Diagnosis not present

## 2019-04-09 DIAGNOSIS — I10 Essential (primary) hypertension: Secondary | ICD-10-CM | POA: Diagnosis not present

## 2019-04-10 DIAGNOSIS — I10 Essential (primary) hypertension: Secondary | ICD-10-CM | POA: Diagnosis not present

## 2019-04-11 DIAGNOSIS — I10 Essential (primary) hypertension: Secondary | ICD-10-CM | POA: Diagnosis not present

## 2019-04-12 DIAGNOSIS — I10 Essential (primary) hypertension: Secondary | ICD-10-CM | POA: Diagnosis not present

## 2019-04-13 DIAGNOSIS — I10 Essential (primary) hypertension: Secondary | ICD-10-CM | POA: Diagnosis not present

## 2019-04-14 DIAGNOSIS — I5022 Chronic systolic (congestive) heart failure: Secondary | ICD-10-CM | POA: Diagnosis not present

## 2019-04-14 DIAGNOSIS — F339 Major depressive disorder, recurrent, unspecified: Secondary | ICD-10-CM | POA: Diagnosis not present

## 2019-04-14 DIAGNOSIS — F419 Anxiety disorder, unspecified: Secondary | ICD-10-CM | POA: Diagnosis not present

## 2019-04-14 DIAGNOSIS — Z79899 Other long term (current) drug therapy: Secondary | ICD-10-CM | POA: Diagnosis not present

## 2019-04-14 DIAGNOSIS — E782 Mixed hyperlipidemia: Secondary | ICD-10-CM | POA: Diagnosis not present

## 2019-04-14 DIAGNOSIS — N181 Chronic kidney disease, stage 1: Secondary | ICD-10-CM | POA: Diagnosis not present

## 2019-04-14 DIAGNOSIS — G8190 Hemiplegia, unspecified affecting unspecified side: Secondary | ICD-10-CM | POA: Diagnosis not present

## 2019-04-14 DIAGNOSIS — Z8673 Personal history of transient ischemic attack (TIA), and cerebral infarction without residual deficits: Secondary | ICD-10-CM | POA: Diagnosis not present

## 2019-04-14 DIAGNOSIS — I10 Essential (primary) hypertension: Secondary | ICD-10-CM | POA: Diagnosis not present

## 2019-04-15 DIAGNOSIS — I10 Essential (primary) hypertension: Secondary | ICD-10-CM | POA: Diagnosis not present

## 2019-04-16 DIAGNOSIS — I10 Essential (primary) hypertension: Secondary | ICD-10-CM | POA: Diagnosis not present

## 2019-04-17 DIAGNOSIS — I10 Essential (primary) hypertension: Secondary | ICD-10-CM | POA: Diagnosis not present

## 2019-04-18 DIAGNOSIS — I10 Essential (primary) hypertension: Secondary | ICD-10-CM | POA: Diagnosis not present

## 2019-04-19 DIAGNOSIS — I10 Essential (primary) hypertension: Secondary | ICD-10-CM | POA: Diagnosis not present

## 2019-04-20 DIAGNOSIS — I10 Essential (primary) hypertension: Secondary | ICD-10-CM | POA: Diagnosis not present

## 2019-04-21 DIAGNOSIS — I10 Essential (primary) hypertension: Secondary | ICD-10-CM | POA: Diagnosis not present

## 2019-04-22 DIAGNOSIS — I10 Essential (primary) hypertension: Secondary | ICD-10-CM | POA: Diagnosis not present

## 2019-04-23 DIAGNOSIS — Z20828 Contact with and (suspected) exposure to other viral communicable diseases: Secondary | ICD-10-CM | POA: Diagnosis not present

## 2019-04-23 DIAGNOSIS — I10 Essential (primary) hypertension: Secondary | ICD-10-CM | POA: Diagnosis not present

## 2019-04-24 DIAGNOSIS — I10 Essential (primary) hypertension: Secondary | ICD-10-CM | POA: Diagnosis not present

## 2019-04-25 DIAGNOSIS — I10 Essential (primary) hypertension: Secondary | ICD-10-CM | POA: Diagnosis not present

## 2019-04-26 DIAGNOSIS — I10 Essential (primary) hypertension: Secondary | ICD-10-CM | POA: Diagnosis not present

## 2019-04-27 DIAGNOSIS — I10 Essential (primary) hypertension: Secondary | ICD-10-CM | POA: Diagnosis not present

## 2019-04-28 DIAGNOSIS — I10 Essential (primary) hypertension: Secondary | ICD-10-CM | POA: Diagnosis not present

## 2019-04-29 DIAGNOSIS — I10 Essential (primary) hypertension: Secondary | ICD-10-CM | POA: Diagnosis not present

## 2019-04-30 DIAGNOSIS — Z20828 Contact with and (suspected) exposure to other viral communicable diseases: Secondary | ICD-10-CM | POA: Diagnosis not present

## 2019-04-30 DIAGNOSIS — I10 Essential (primary) hypertension: Secondary | ICD-10-CM | POA: Diagnosis not present

## 2019-05-01 DIAGNOSIS — I10 Essential (primary) hypertension: Secondary | ICD-10-CM | POA: Diagnosis not present

## 2019-05-02 DIAGNOSIS — I10 Essential (primary) hypertension: Secondary | ICD-10-CM | POA: Diagnosis not present

## 2019-05-03 DIAGNOSIS — I10 Essential (primary) hypertension: Secondary | ICD-10-CM | POA: Diagnosis not present

## 2019-05-04 DIAGNOSIS — I10 Essential (primary) hypertension: Secondary | ICD-10-CM | POA: Diagnosis not present

## 2019-05-05 DIAGNOSIS — I10 Essential (primary) hypertension: Secondary | ICD-10-CM | POA: Diagnosis not present

## 2019-05-06 DIAGNOSIS — I10 Essential (primary) hypertension: Secondary | ICD-10-CM | POA: Diagnosis not present

## 2019-05-07 DIAGNOSIS — I10 Essential (primary) hypertension: Secondary | ICD-10-CM | POA: Diagnosis not present

## 2019-05-08 DIAGNOSIS — I10 Essential (primary) hypertension: Secondary | ICD-10-CM | POA: Diagnosis not present

## 2019-05-09 DIAGNOSIS — I10 Essential (primary) hypertension: Secondary | ICD-10-CM | POA: Diagnosis not present

## 2019-05-10 DIAGNOSIS — I10 Essential (primary) hypertension: Secondary | ICD-10-CM | POA: Diagnosis not present

## 2019-05-11 DIAGNOSIS — I10 Essential (primary) hypertension: Secondary | ICD-10-CM | POA: Diagnosis not present

## 2019-05-12 DIAGNOSIS — Z79899 Other long term (current) drug therapy: Secondary | ICD-10-CM | POA: Diagnosis not present

## 2019-05-12 DIAGNOSIS — G894 Chronic pain syndrome: Secondary | ICD-10-CM | POA: Diagnosis not present

## 2019-05-12 DIAGNOSIS — I5022 Chronic systolic (congestive) heart failure: Secondary | ICD-10-CM | POA: Diagnosis not present

## 2019-05-12 DIAGNOSIS — M25511 Pain in right shoulder: Secondary | ICD-10-CM | POA: Diagnosis not present

## 2019-05-12 DIAGNOSIS — F5101 Primary insomnia: Secondary | ICD-10-CM | POA: Diagnosis not present

## 2019-05-12 DIAGNOSIS — F339 Major depressive disorder, recurrent, unspecified: Secondary | ICD-10-CM | POA: Diagnosis not present

## 2019-05-12 DIAGNOSIS — I10 Essential (primary) hypertension: Secondary | ICD-10-CM | POA: Diagnosis not present

## 2019-05-12 DIAGNOSIS — G8190 Hemiplegia, unspecified affecting unspecified side: Secondary | ICD-10-CM | POA: Diagnosis not present

## 2019-05-12 DIAGNOSIS — Z8673 Personal history of transient ischemic attack (TIA), and cerebral infarction without residual deficits: Secondary | ICD-10-CM | POA: Diagnosis not present

## 2019-05-13 DIAGNOSIS — I10 Essential (primary) hypertension: Secondary | ICD-10-CM | POA: Diagnosis not present

## 2019-05-14 DIAGNOSIS — I10 Essential (primary) hypertension: Secondary | ICD-10-CM | POA: Diagnosis not present

## 2019-05-15 DIAGNOSIS — I10 Essential (primary) hypertension: Secondary | ICD-10-CM | POA: Diagnosis not present

## 2019-05-16 DIAGNOSIS — I10 Essential (primary) hypertension: Secondary | ICD-10-CM | POA: Diagnosis not present

## 2019-05-17 DIAGNOSIS — I10 Essential (primary) hypertension: Secondary | ICD-10-CM | POA: Diagnosis not present

## 2019-05-18 DIAGNOSIS — I10 Essential (primary) hypertension: Secondary | ICD-10-CM | POA: Diagnosis not present

## 2019-05-19 DIAGNOSIS — I10 Essential (primary) hypertension: Secondary | ICD-10-CM | POA: Diagnosis not present

## 2019-05-20 DIAGNOSIS — I10 Essential (primary) hypertension: Secondary | ICD-10-CM | POA: Diagnosis not present

## 2019-05-21 DIAGNOSIS — I10 Essential (primary) hypertension: Secondary | ICD-10-CM | POA: Diagnosis not present

## 2019-05-22 DIAGNOSIS — I10 Essential (primary) hypertension: Secondary | ICD-10-CM | POA: Diagnosis not present

## 2019-05-23 DIAGNOSIS — I10 Essential (primary) hypertension: Secondary | ICD-10-CM | POA: Diagnosis not present

## 2019-05-24 DIAGNOSIS — I10 Essential (primary) hypertension: Secondary | ICD-10-CM | POA: Diagnosis not present

## 2019-05-25 DIAGNOSIS — I10 Essential (primary) hypertension: Secondary | ICD-10-CM | POA: Diagnosis not present

## 2019-05-26 DIAGNOSIS — I10 Essential (primary) hypertension: Secondary | ICD-10-CM | POA: Diagnosis not present

## 2019-05-27 DIAGNOSIS — I10 Essential (primary) hypertension: Secondary | ICD-10-CM | POA: Diagnosis not present

## 2019-05-28 DIAGNOSIS — I10 Essential (primary) hypertension: Secondary | ICD-10-CM | POA: Diagnosis not present

## 2019-05-29 DIAGNOSIS — I10 Essential (primary) hypertension: Secondary | ICD-10-CM | POA: Diagnosis not present

## 2019-05-30 DIAGNOSIS — I10 Essential (primary) hypertension: Secondary | ICD-10-CM | POA: Diagnosis not present

## 2019-05-31 DIAGNOSIS — I10 Essential (primary) hypertension: Secondary | ICD-10-CM | POA: Diagnosis not present

## 2019-06-01 DIAGNOSIS — I10 Essential (primary) hypertension: Secondary | ICD-10-CM | POA: Diagnosis not present

## 2019-06-02 DIAGNOSIS — I10 Essential (primary) hypertension: Secondary | ICD-10-CM | POA: Diagnosis not present

## 2019-06-03 DIAGNOSIS — I10 Essential (primary) hypertension: Secondary | ICD-10-CM | POA: Diagnosis not present

## 2019-06-04 DIAGNOSIS — I10 Essential (primary) hypertension: Secondary | ICD-10-CM | POA: Diagnosis not present

## 2019-06-05 DIAGNOSIS — I10 Essential (primary) hypertension: Secondary | ICD-10-CM | POA: Diagnosis not present

## 2019-06-06 DIAGNOSIS — I10 Essential (primary) hypertension: Secondary | ICD-10-CM | POA: Diagnosis not present

## 2019-06-07 DIAGNOSIS — I10 Essential (primary) hypertension: Secondary | ICD-10-CM | POA: Diagnosis not present

## 2019-06-08 DIAGNOSIS — I10 Essential (primary) hypertension: Secondary | ICD-10-CM | POA: Diagnosis not present

## 2019-06-09 DIAGNOSIS — I5020 Unspecified systolic (congestive) heart failure: Secondary | ICD-10-CM | POA: Diagnosis not present

## 2019-06-09 DIAGNOSIS — G894 Chronic pain syndrome: Secondary | ICD-10-CM | POA: Diagnosis not present

## 2019-06-09 DIAGNOSIS — R2681 Unsteadiness on feet: Secondary | ICD-10-CM | POA: Diagnosis not present

## 2019-06-09 DIAGNOSIS — Z79899 Other long term (current) drug therapy: Secondary | ICD-10-CM | POA: Diagnosis not present

## 2019-06-09 DIAGNOSIS — G8190 Hemiplegia, unspecified affecting unspecified side: Secondary | ICD-10-CM | POA: Diagnosis not present

## 2019-06-09 DIAGNOSIS — F339 Major depressive disorder, recurrent, unspecified: Secondary | ICD-10-CM | POA: Diagnosis not present

## 2019-06-09 DIAGNOSIS — Z8673 Personal history of transient ischemic attack (TIA), and cerebral infarction without residual deficits: Secondary | ICD-10-CM | POA: Diagnosis not present

## 2019-06-09 DIAGNOSIS — M25511 Pain in right shoulder: Secondary | ICD-10-CM | POA: Diagnosis not present

## 2019-06-09 DIAGNOSIS — I10 Essential (primary) hypertension: Secondary | ICD-10-CM | POA: Diagnosis not present

## 2019-06-10 DIAGNOSIS — I10 Essential (primary) hypertension: Secondary | ICD-10-CM | POA: Diagnosis not present

## 2019-06-11 DIAGNOSIS — I10 Essential (primary) hypertension: Secondary | ICD-10-CM | POA: Diagnosis not present

## 2019-06-12 DIAGNOSIS — I10 Essential (primary) hypertension: Secondary | ICD-10-CM | POA: Diagnosis not present

## 2019-06-13 DIAGNOSIS — I10 Essential (primary) hypertension: Secondary | ICD-10-CM | POA: Diagnosis not present

## 2019-06-14 DIAGNOSIS — I10 Essential (primary) hypertension: Secondary | ICD-10-CM | POA: Diagnosis not present

## 2019-06-15 DIAGNOSIS — I10 Essential (primary) hypertension: Secondary | ICD-10-CM | POA: Diagnosis not present

## 2019-06-16 DIAGNOSIS — I10 Essential (primary) hypertension: Secondary | ICD-10-CM | POA: Diagnosis not present

## 2019-06-17 DIAGNOSIS — I10 Essential (primary) hypertension: Secondary | ICD-10-CM | POA: Diagnosis not present

## 2019-06-18 DIAGNOSIS — I10 Essential (primary) hypertension: Secondary | ICD-10-CM | POA: Diagnosis not present

## 2019-06-19 DIAGNOSIS — I10 Essential (primary) hypertension: Secondary | ICD-10-CM | POA: Diagnosis not present

## 2019-06-20 DIAGNOSIS — I10 Essential (primary) hypertension: Secondary | ICD-10-CM | POA: Diagnosis not present

## 2019-06-21 DIAGNOSIS — I10 Essential (primary) hypertension: Secondary | ICD-10-CM | POA: Diagnosis not present

## 2019-06-22 DIAGNOSIS — I10 Essential (primary) hypertension: Secondary | ICD-10-CM | POA: Diagnosis not present

## 2019-06-23 DIAGNOSIS — I10 Essential (primary) hypertension: Secondary | ICD-10-CM | POA: Diagnosis not present

## 2019-06-24 DIAGNOSIS — I10 Essential (primary) hypertension: Secondary | ICD-10-CM | POA: Diagnosis not present

## 2019-06-25 DIAGNOSIS — I10 Essential (primary) hypertension: Secondary | ICD-10-CM | POA: Diagnosis not present

## 2019-06-26 DIAGNOSIS — I10 Essential (primary) hypertension: Secondary | ICD-10-CM | POA: Diagnosis not present

## 2019-06-27 DIAGNOSIS — I10 Essential (primary) hypertension: Secondary | ICD-10-CM | POA: Diagnosis not present

## 2019-06-28 DIAGNOSIS — I10 Essential (primary) hypertension: Secondary | ICD-10-CM | POA: Diagnosis not present

## 2019-06-29 DIAGNOSIS — I10 Essential (primary) hypertension: Secondary | ICD-10-CM | POA: Diagnosis not present

## 2019-06-30 DIAGNOSIS — I10 Essential (primary) hypertension: Secondary | ICD-10-CM | POA: Diagnosis not present

## 2019-07-01 DIAGNOSIS — I10 Essential (primary) hypertension: Secondary | ICD-10-CM | POA: Diagnosis not present

## 2019-07-02 DIAGNOSIS — I10 Essential (primary) hypertension: Secondary | ICD-10-CM | POA: Diagnosis not present

## 2019-07-03 DIAGNOSIS — I10 Essential (primary) hypertension: Secondary | ICD-10-CM | POA: Diagnosis not present

## 2019-07-04 DIAGNOSIS — I10 Essential (primary) hypertension: Secondary | ICD-10-CM | POA: Diagnosis not present

## 2019-07-05 DIAGNOSIS — I10 Essential (primary) hypertension: Secondary | ICD-10-CM | POA: Diagnosis not present

## 2019-07-06 DIAGNOSIS — I10 Essential (primary) hypertension: Secondary | ICD-10-CM | POA: Diagnosis not present

## 2019-07-07 DIAGNOSIS — I10 Essential (primary) hypertension: Secondary | ICD-10-CM | POA: Diagnosis not present

## 2019-07-08 DIAGNOSIS — I10 Essential (primary) hypertension: Secondary | ICD-10-CM | POA: Diagnosis not present

## 2019-07-09 DIAGNOSIS — I10 Essential (primary) hypertension: Secondary | ICD-10-CM | POA: Diagnosis not present

## 2019-07-10 DIAGNOSIS — I10 Essential (primary) hypertension: Secondary | ICD-10-CM | POA: Diagnosis not present

## 2019-07-11 DIAGNOSIS — I10 Essential (primary) hypertension: Secondary | ICD-10-CM | POA: Diagnosis not present

## 2019-07-12 DIAGNOSIS — I10 Essential (primary) hypertension: Secondary | ICD-10-CM | POA: Diagnosis not present

## 2019-07-20 DIAGNOSIS — I10 Essential (primary) hypertension: Secondary | ICD-10-CM | POA: Diagnosis not present

## 2019-07-21 DIAGNOSIS — I10 Essential (primary) hypertension: Secondary | ICD-10-CM | POA: Diagnosis not present

## 2019-07-22 DIAGNOSIS — I10 Essential (primary) hypertension: Secondary | ICD-10-CM | POA: Diagnosis not present

## 2019-07-23 DIAGNOSIS — I10 Essential (primary) hypertension: Secondary | ICD-10-CM | POA: Diagnosis not present

## 2019-07-24 DIAGNOSIS — I10 Essential (primary) hypertension: Secondary | ICD-10-CM | POA: Diagnosis not present

## 2019-07-25 DIAGNOSIS — I10 Essential (primary) hypertension: Secondary | ICD-10-CM | POA: Diagnosis not present

## 2019-07-26 DIAGNOSIS — I10 Essential (primary) hypertension: Secondary | ICD-10-CM | POA: Diagnosis not present

## 2019-07-27 DIAGNOSIS — I10 Essential (primary) hypertension: Secondary | ICD-10-CM | POA: Diagnosis not present

## 2019-07-28 DIAGNOSIS — I10 Essential (primary) hypertension: Secondary | ICD-10-CM | POA: Diagnosis not present

## 2019-07-29 DIAGNOSIS — I10 Essential (primary) hypertension: Secondary | ICD-10-CM | POA: Diagnosis not present

## 2019-07-30 DIAGNOSIS — I10 Essential (primary) hypertension: Secondary | ICD-10-CM | POA: Diagnosis not present

## 2019-07-31 DIAGNOSIS — I10 Essential (primary) hypertension: Secondary | ICD-10-CM | POA: Diagnosis not present

## 2019-08-01 DIAGNOSIS — I10 Essential (primary) hypertension: Secondary | ICD-10-CM | POA: Diagnosis not present

## 2019-08-02 DIAGNOSIS — I10 Essential (primary) hypertension: Secondary | ICD-10-CM | POA: Diagnosis not present

## 2019-08-03 DIAGNOSIS — I10 Essential (primary) hypertension: Secondary | ICD-10-CM | POA: Diagnosis not present

## 2019-08-04 DIAGNOSIS — I10 Essential (primary) hypertension: Secondary | ICD-10-CM | POA: Diagnosis not present

## 2019-08-05 DIAGNOSIS — I10 Essential (primary) hypertension: Secondary | ICD-10-CM | POA: Diagnosis not present

## 2019-08-06 DIAGNOSIS — I10 Essential (primary) hypertension: Secondary | ICD-10-CM | POA: Diagnosis not present

## 2019-08-07 DIAGNOSIS — I10 Essential (primary) hypertension: Secondary | ICD-10-CM | POA: Diagnosis not present

## 2019-08-08 DIAGNOSIS — I10 Essential (primary) hypertension: Secondary | ICD-10-CM | POA: Diagnosis not present

## 2019-08-09 DIAGNOSIS — I10 Essential (primary) hypertension: Secondary | ICD-10-CM | POA: Diagnosis not present

## 2019-08-10 DIAGNOSIS — I10 Essential (primary) hypertension: Secondary | ICD-10-CM | POA: Diagnosis not present

## 2019-08-11 DIAGNOSIS — I10 Essential (primary) hypertension: Secondary | ICD-10-CM | POA: Diagnosis not present

## 2019-08-12 DIAGNOSIS — I10 Essential (primary) hypertension: Secondary | ICD-10-CM | POA: Diagnosis not present

## 2019-08-13 DIAGNOSIS — I10 Essential (primary) hypertension: Secondary | ICD-10-CM | POA: Diagnosis not present

## 2019-08-14 DIAGNOSIS — I10 Essential (primary) hypertension: Secondary | ICD-10-CM | POA: Diagnosis not present

## 2019-08-15 DIAGNOSIS — I10 Essential (primary) hypertension: Secondary | ICD-10-CM | POA: Diagnosis not present

## 2019-08-16 DIAGNOSIS — I10 Essential (primary) hypertension: Secondary | ICD-10-CM | POA: Diagnosis not present

## 2019-08-17 DIAGNOSIS — I10 Essential (primary) hypertension: Secondary | ICD-10-CM | POA: Diagnosis not present

## 2019-08-18 DIAGNOSIS — I10 Essential (primary) hypertension: Secondary | ICD-10-CM | POA: Diagnosis not present

## 2019-08-19 DIAGNOSIS — I10 Essential (primary) hypertension: Secondary | ICD-10-CM | POA: Diagnosis not present

## 2019-08-20 DIAGNOSIS — I10 Essential (primary) hypertension: Secondary | ICD-10-CM | POA: Diagnosis not present

## 2019-08-21 DIAGNOSIS — I10 Essential (primary) hypertension: Secondary | ICD-10-CM | POA: Diagnosis not present

## 2019-08-22 DIAGNOSIS — I10 Essential (primary) hypertension: Secondary | ICD-10-CM | POA: Diagnosis not present

## 2019-08-23 DIAGNOSIS — I10 Essential (primary) hypertension: Secondary | ICD-10-CM | POA: Diagnosis not present

## 2019-08-24 DIAGNOSIS — I10 Essential (primary) hypertension: Secondary | ICD-10-CM | POA: Diagnosis not present

## 2019-08-25 DIAGNOSIS — I10 Essential (primary) hypertension: Secondary | ICD-10-CM | POA: Diagnosis not present

## 2019-08-26 DIAGNOSIS — I10 Essential (primary) hypertension: Secondary | ICD-10-CM | POA: Diagnosis not present

## 2019-08-27 DIAGNOSIS — I10 Essential (primary) hypertension: Secondary | ICD-10-CM | POA: Diagnosis not present

## 2019-08-28 DIAGNOSIS — I10 Essential (primary) hypertension: Secondary | ICD-10-CM | POA: Diagnosis not present

## 2019-08-29 DIAGNOSIS — I10 Essential (primary) hypertension: Secondary | ICD-10-CM | POA: Diagnosis not present

## 2019-08-29 IMAGING — CT CT ANGIO CHEST
2 of 6 series · 18 of 46 positions shown · IV contrast (APPLIED)
Comparison: Chest CT 04/23/2008

CLINICAL DATA: Shortness of breath for 2 weeks. Stroke 2 months
ago.

EXAM:
CT ANGIOGRAPHY CHEST WITH CONTRAST
TECHNIQUE: Multidetector CT imaging of the chest was performed using the
standard protocol during bolus administration of intravenous
contrast. Multiplanar CT image reconstructions and MIPs were
obtained to evaluate the vascular anatomy.
CONTRAST:  100mL HQNTSJ-AM2 IOPAMIDOL (HQNTSJ-AM2) INJECTION 76%

[Series 6: thins · axial · 0.96mm/px · z∈[-327,-61]mm · 16 of 292 slices shown]
[im 13/292  lung]
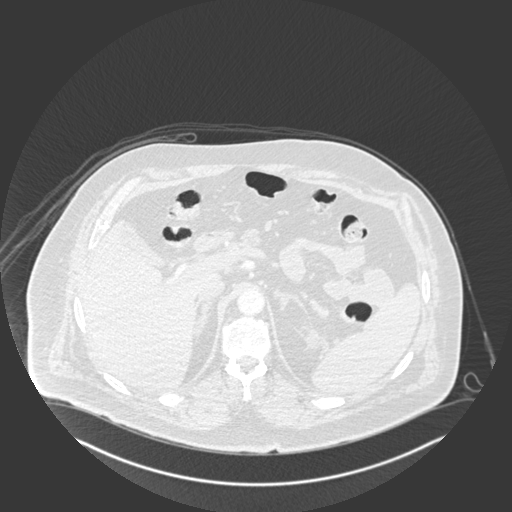
[im 38/292  soft-tissue]
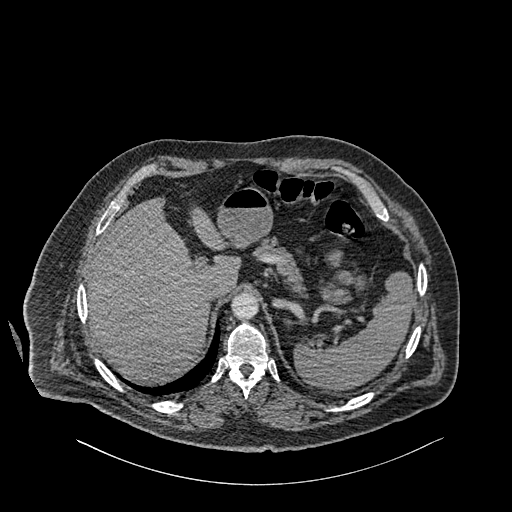
[im 51/292  lung]
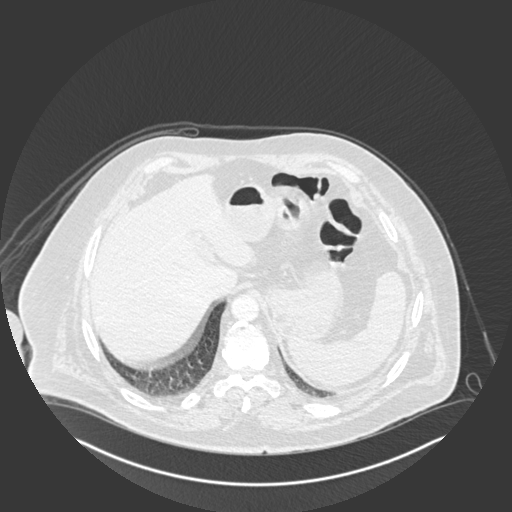
[im 64/292  soft-tissue]
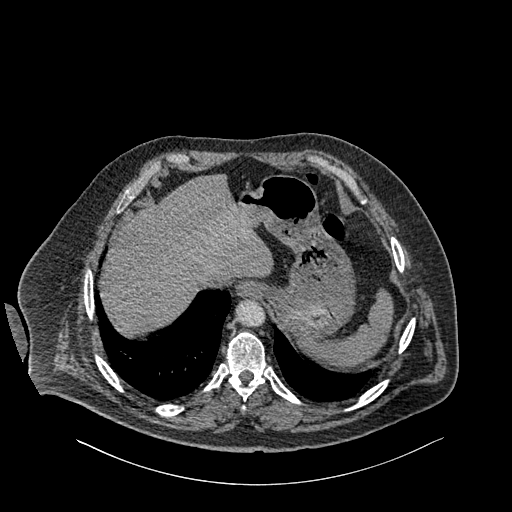
[im 89/292  lung]
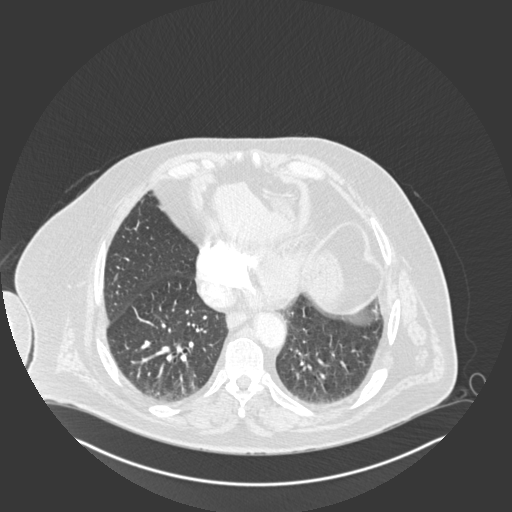
[im 102/292  soft-tissue]
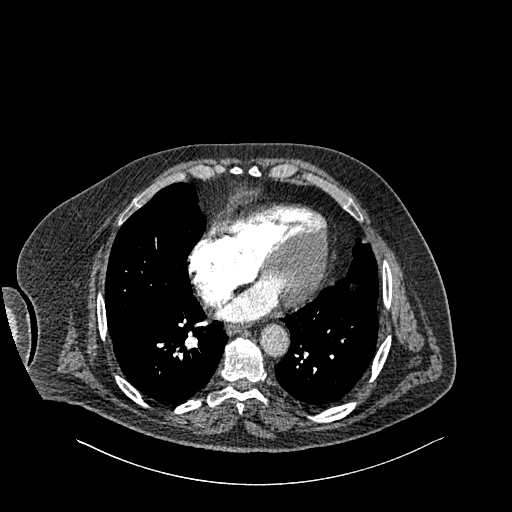
[im 114/292  lung]
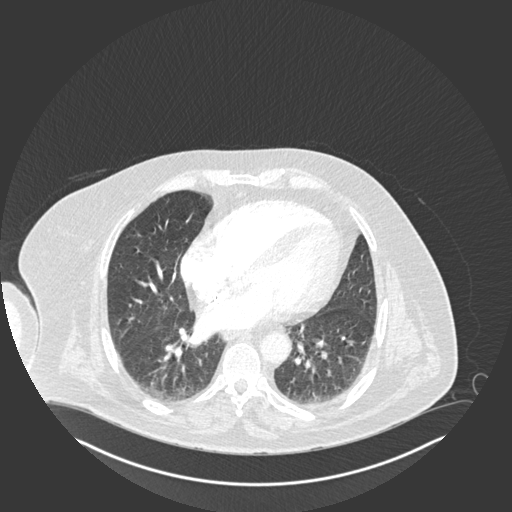
[im 140/292  soft-tissue]
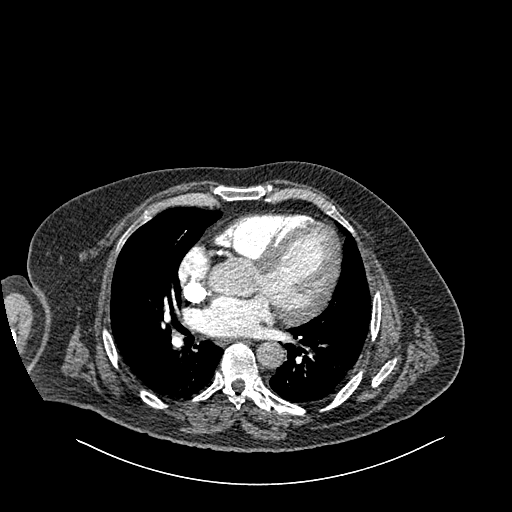
[im 152/292  lung]
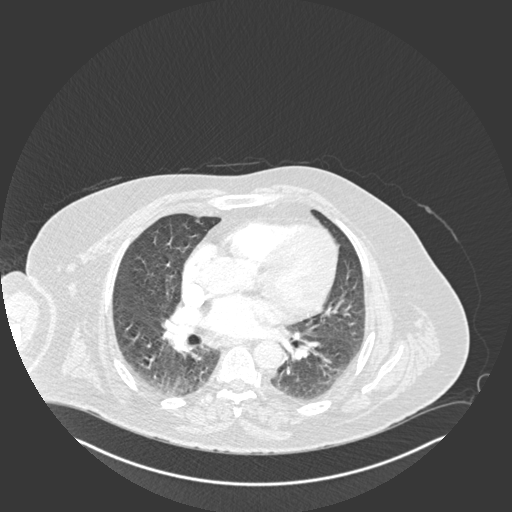
[im 178/292  soft-tissue]
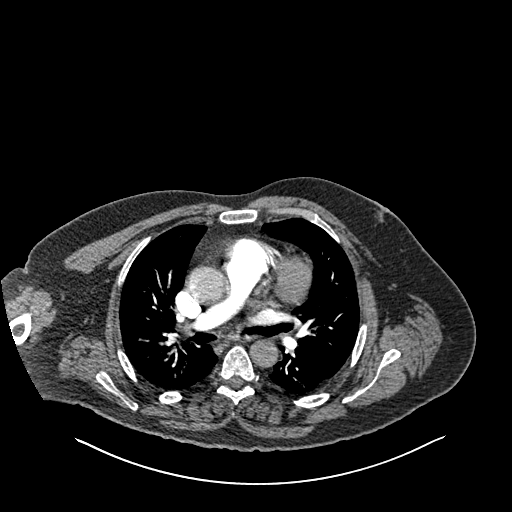
[im 190/292  lung]
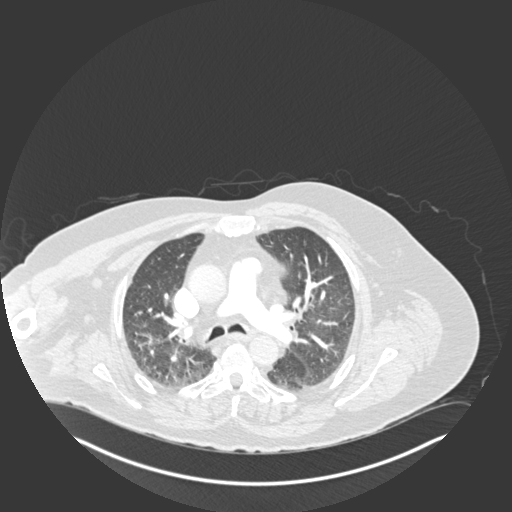
[im 203/292  soft-tissue]
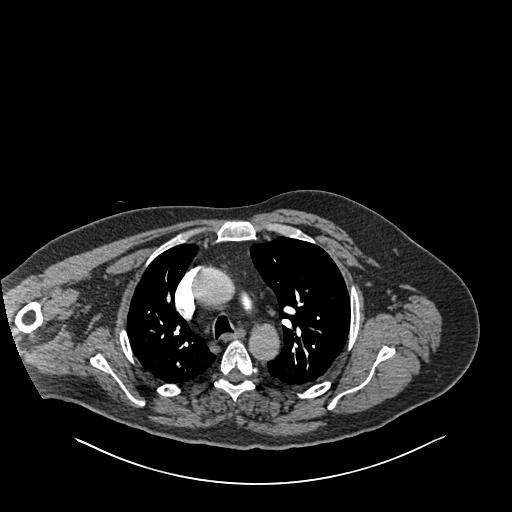
[im 228/292  lung]
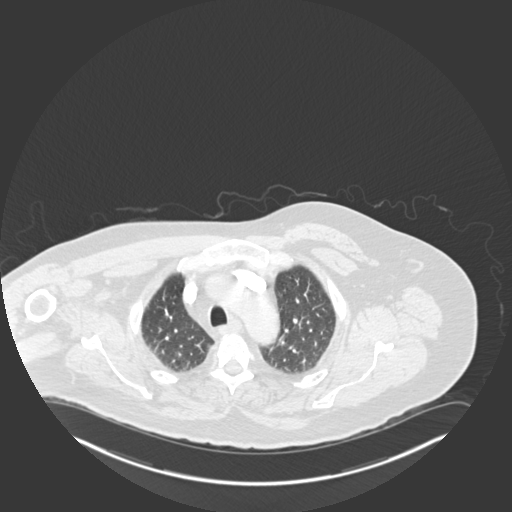
[im 241/292  soft-tissue]
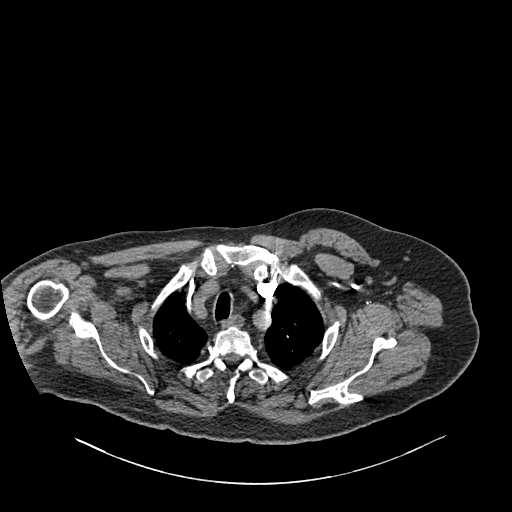
[im 254/292  lung]
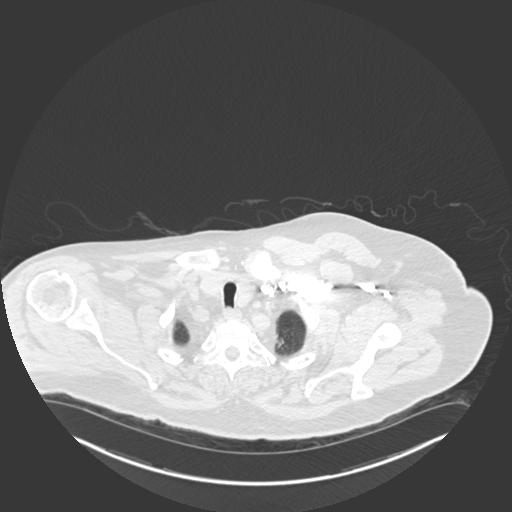
[im 279/292  soft-tissue]
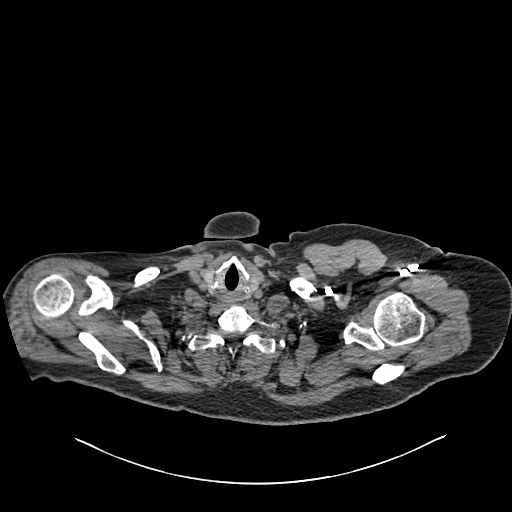

[Series 7: coronal mpr · coronal · 0.60mm/px · 2 of 93 slices shown]
[im 31/93  soft-tissue]
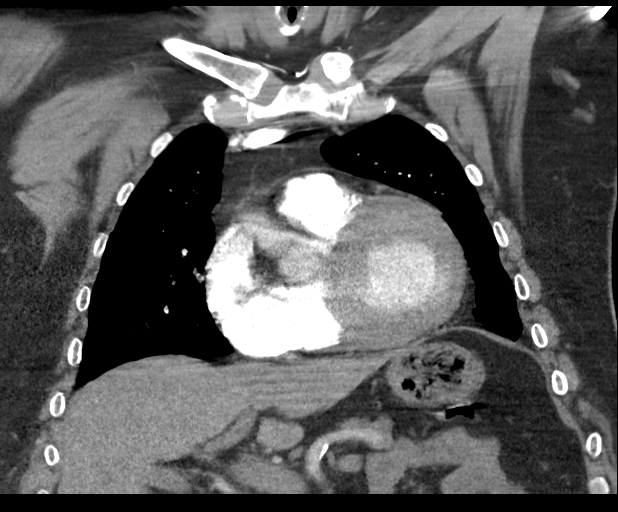
[im 62/93  soft-tissue]
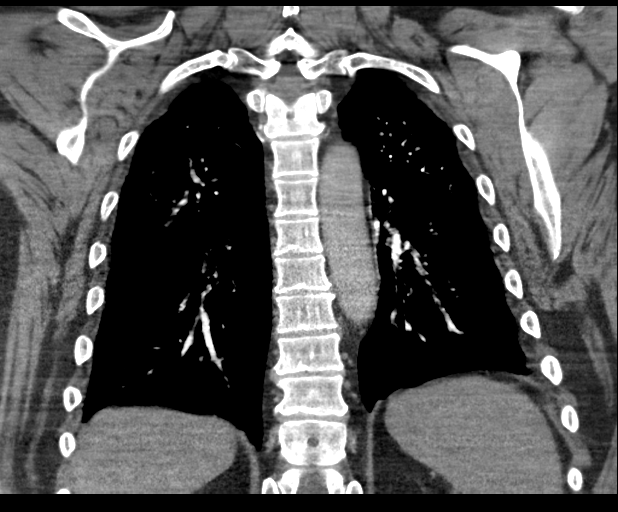

[18 of 46 positions shown; findings below may reference images not displayed]

FINDINGS: Cardiovascular: Pulmonary arterial opacification is adequate,
however there is mild-to-moderate respiratory motion artifact. No
lobar or more central pulmonary emboli are seen, and no definite
segmental emboli are identified within limitations of motion. The
heart is mildly enlarged. There is no pericardial effusion. There is
no thoracic aortic aneurysm.

Mediastinum/Nodes: No enlarged axillary, mediastinal, or hilar lymph
nodes are identified. The esophagus and thyroid are grossly
unremarkable.

Lungs/Pleura: No pleural effusion or pneumothorax. Detailed
assessment of the lung parenchyma is limited by motion artifact.
Dependent subsegmental atelectasis is present bilaterally. No mass.

Upper Abdomen: Unremarkable.

Musculoskeletal: Remote, healed posterior left eighth rib fracture.
Mild thoracic disc degeneration. No suspicious osseous lesion.

Review of the MIP images confirms the above findings.
IMPRESSION: No pulmonary emboli identified within limitations of motion
artifact.

## 2019-08-30 DIAGNOSIS — I10 Essential (primary) hypertension: Secondary | ICD-10-CM | POA: Diagnosis not present

## 2019-08-31 DIAGNOSIS — I10 Essential (primary) hypertension: Secondary | ICD-10-CM | POA: Diagnosis not present

## 2019-09-01 DIAGNOSIS — I10 Essential (primary) hypertension: Secondary | ICD-10-CM | POA: Diagnosis not present

## 2019-09-02 DIAGNOSIS — I10 Essential (primary) hypertension: Secondary | ICD-10-CM | POA: Diagnosis not present

## 2019-09-03 DIAGNOSIS — I10 Essential (primary) hypertension: Secondary | ICD-10-CM | POA: Diagnosis not present

## 2019-09-04 DIAGNOSIS — I10 Essential (primary) hypertension: Secondary | ICD-10-CM | POA: Diagnosis not present

## 2019-09-05 DIAGNOSIS — I10 Essential (primary) hypertension: Secondary | ICD-10-CM | POA: Diagnosis not present

## 2019-09-06 DIAGNOSIS — I10 Essential (primary) hypertension: Secondary | ICD-10-CM | POA: Diagnosis not present

## 2019-09-07 DIAGNOSIS — I10 Essential (primary) hypertension: Secondary | ICD-10-CM | POA: Diagnosis not present

## 2019-09-08 DIAGNOSIS — I10 Essential (primary) hypertension: Secondary | ICD-10-CM | POA: Diagnosis not present

## 2019-09-09 DIAGNOSIS — I10 Essential (primary) hypertension: Secondary | ICD-10-CM | POA: Diagnosis not present

## 2019-09-10 DIAGNOSIS — I10 Essential (primary) hypertension: Secondary | ICD-10-CM | POA: Diagnosis not present

## 2019-09-11 DIAGNOSIS — I10 Essential (primary) hypertension: Secondary | ICD-10-CM | POA: Diagnosis not present

## 2019-09-12 DIAGNOSIS — I10 Essential (primary) hypertension: Secondary | ICD-10-CM | POA: Diagnosis not present

## 2019-09-13 DIAGNOSIS — I10 Essential (primary) hypertension: Secondary | ICD-10-CM | POA: Diagnosis not present

## 2019-09-14 DIAGNOSIS — I10 Essential (primary) hypertension: Secondary | ICD-10-CM | POA: Diagnosis not present

## 2019-09-15 DIAGNOSIS — I10 Essential (primary) hypertension: Secondary | ICD-10-CM | POA: Diagnosis not present

## 2019-09-16 DIAGNOSIS — I10 Essential (primary) hypertension: Secondary | ICD-10-CM | POA: Diagnosis not present

## 2019-09-17 DIAGNOSIS — I10 Essential (primary) hypertension: Secondary | ICD-10-CM | POA: Diagnosis not present

## 2019-09-18 DIAGNOSIS — I10 Essential (primary) hypertension: Secondary | ICD-10-CM | POA: Diagnosis not present

## 2019-09-19 DIAGNOSIS — I10 Essential (primary) hypertension: Secondary | ICD-10-CM | POA: Diagnosis not present

## 2019-09-20 DIAGNOSIS — I10 Essential (primary) hypertension: Secondary | ICD-10-CM | POA: Diagnosis not present

## 2019-09-21 DIAGNOSIS — I10 Essential (primary) hypertension: Secondary | ICD-10-CM | POA: Diagnosis not present

## 2019-09-22 DIAGNOSIS — I10 Essential (primary) hypertension: Secondary | ICD-10-CM | POA: Diagnosis not present

## 2019-09-23 DIAGNOSIS — I10 Essential (primary) hypertension: Secondary | ICD-10-CM | POA: Diagnosis not present

## 2019-09-24 DIAGNOSIS — I10 Essential (primary) hypertension: Secondary | ICD-10-CM | POA: Diagnosis not present

## 2019-09-25 DIAGNOSIS — I10 Essential (primary) hypertension: Secondary | ICD-10-CM | POA: Diagnosis not present

## 2019-09-26 DIAGNOSIS — I10 Essential (primary) hypertension: Secondary | ICD-10-CM | POA: Diagnosis not present

## 2019-09-27 DIAGNOSIS — I10 Essential (primary) hypertension: Secondary | ICD-10-CM | POA: Diagnosis not present

## 2019-09-28 DIAGNOSIS — I10 Essential (primary) hypertension: Secondary | ICD-10-CM | POA: Diagnosis not present

## 2019-09-29 DIAGNOSIS — I10 Essential (primary) hypertension: Secondary | ICD-10-CM | POA: Diagnosis not present

## 2019-09-30 DIAGNOSIS — I10 Essential (primary) hypertension: Secondary | ICD-10-CM | POA: Diagnosis not present

## 2019-10-01 DIAGNOSIS — I10 Essential (primary) hypertension: Secondary | ICD-10-CM | POA: Diagnosis not present

## 2019-10-02 DIAGNOSIS — I10 Essential (primary) hypertension: Secondary | ICD-10-CM | POA: Diagnosis not present

## 2019-10-03 DIAGNOSIS — I10 Essential (primary) hypertension: Secondary | ICD-10-CM | POA: Diagnosis not present

## 2019-10-04 DIAGNOSIS — I10 Essential (primary) hypertension: Secondary | ICD-10-CM | POA: Diagnosis not present

## 2019-10-05 DIAGNOSIS — I10 Essential (primary) hypertension: Secondary | ICD-10-CM | POA: Diagnosis not present

## 2019-10-06 DIAGNOSIS — I10 Essential (primary) hypertension: Secondary | ICD-10-CM | POA: Diagnosis not present

## 2019-10-07 DIAGNOSIS — I10 Essential (primary) hypertension: Secondary | ICD-10-CM | POA: Diagnosis not present

## 2019-10-08 DIAGNOSIS — I10 Essential (primary) hypertension: Secondary | ICD-10-CM | POA: Diagnosis not present

## 2019-10-09 DIAGNOSIS — I10 Essential (primary) hypertension: Secondary | ICD-10-CM | POA: Diagnosis not present

## 2019-10-10 DIAGNOSIS — I10 Essential (primary) hypertension: Secondary | ICD-10-CM | POA: Diagnosis not present

## 2019-10-11 DIAGNOSIS — I10 Essential (primary) hypertension: Secondary | ICD-10-CM | POA: Diagnosis not present

## 2019-10-12 DIAGNOSIS — I10 Essential (primary) hypertension: Secondary | ICD-10-CM | POA: Diagnosis not present

## 2019-10-13 DIAGNOSIS — I10 Essential (primary) hypertension: Secondary | ICD-10-CM | POA: Diagnosis not present

## 2019-10-14 DIAGNOSIS — I10 Essential (primary) hypertension: Secondary | ICD-10-CM | POA: Diagnosis not present

## 2019-10-15 DIAGNOSIS — I10 Essential (primary) hypertension: Secondary | ICD-10-CM | POA: Diagnosis not present

## 2019-10-16 DIAGNOSIS — I10 Essential (primary) hypertension: Secondary | ICD-10-CM | POA: Diagnosis not present

## 2019-10-17 DIAGNOSIS — I10 Essential (primary) hypertension: Secondary | ICD-10-CM | POA: Diagnosis not present

## 2019-10-18 DIAGNOSIS — I10 Essential (primary) hypertension: Secondary | ICD-10-CM | POA: Diagnosis not present

## 2019-10-19 DIAGNOSIS — I10 Essential (primary) hypertension: Secondary | ICD-10-CM | POA: Diagnosis not present

## 2019-10-20 DIAGNOSIS — I10 Essential (primary) hypertension: Secondary | ICD-10-CM | POA: Diagnosis not present

## 2019-10-21 DIAGNOSIS — I10 Essential (primary) hypertension: Secondary | ICD-10-CM | POA: Diagnosis not present

## 2019-10-22 DIAGNOSIS — I10 Essential (primary) hypertension: Secondary | ICD-10-CM | POA: Diagnosis not present

## 2019-10-23 DIAGNOSIS — I10 Essential (primary) hypertension: Secondary | ICD-10-CM | POA: Diagnosis not present

## 2019-10-23 DIAGNOSIS — Z20828 Contact with and (suspected) exposure to other viral communicable diseases: Secondary | ICD-10-CM | POA: Diagnosis not present

## 2019-10-24 DIAGNOSIS — I10 Essential (primary) hypertension: Secondary | ICD-10-CM | POA: Diagnosis not present

## 2019-10-25 DIAGNOSIS — I10 Essential (primary) hypertension: Secondary | ICD-10-CM | POA: Diagnosis not present

## 2019-10-26 DIAGNOSIS — I10 Essential (primary) hypertension: Secondary | ICD-10-CM | POA: Diagnosis not present

## 2019-10-27 DIAGNOSIS — I10 Essential (primary) hypertension: Secondary | ICD-10-CM | POA: Diagnosis not present

## 2019-10-28 DIAGNOSIS — I10 Essential (primary) hypertension: Secondary | ICD-10-CM | POA: Diagnosis not present

## 2019-10-29 DIAGNOSIS — Z20828 Contact with and (suspected) exposure to other viral communicable diseases: Secondary | ICD-10-CM | POA: Diagnosis not present

## 2019-10-29 DIAGNOSIS — I10 Essential (primary) hypertension: Secondary | ICD-10-CM | POA: Diagnosis not present

## 2019-10-30 DIAGNOSIS — I10 Essential (primary) hypertension: Secondary | ICD-10-CM | POA: Diagnosis not present

## 2019-10-31 DIAGNOSIS — I10 Essential (primary) hypertension: Secondary | ICD-10-CM | POA: Diagnosis not present

## 2019-11-01 DIAGNOSIS — I10 Essential (primary) hypertension: Secondary | ICD-10-CM | POA: Diagnosis not present

## 2019-11-02 DIAGNOSIS — I10 Essential (primary) hypertension: Secondary | ICD-10-CM | POA: Diagnosis not present

## 2019-11-03 DIAGNOSIS — I10 Essential (primary) hypertension: Secondary | ICD-10-CM | POA: Diagnosis not present

## 2019-11-04 DIAGNOSIS — I10 Essential (primary) hypertension: Secondary | ICD-10-CM | POA: Diagnosis not present

## 2019-11-05 DIAGNOSIS — I10 Essential (primary) hypertension: Secondary | ICD-10-CM | POA: Diagnosis not present

## 2019-11-06 DIAGNOSIS — I10 Essential (primary) hypertension: Secondary | ICD-10-CM | POA: Diagnosis not present

## 2019-11-06 DIAGNOSIS — G8190 Hemiplegia, unspecified affecting unspecified side: Secondary | ICD-10-CM | POA: Diagnosis not present

## 2019-11-06 DIAGNOSIS — F339 Major depressive disorder, recurrent, unspecified: Secondary | ICD-10-CM | POA: Diagnosis not present

## 2019-11-06 DIAGNOSIS — G894 Chronic pain syndrome: Secondary | ICD-10-CM | POA: Diagnosis not present

## 2019-11-07 DIAGNOSIS — I10 Essential (primary) hypertension: Secondary | ICD-10-CM | POA: Diagnosis not present

## 2019-11-08 DIAGNOSIS — I10 Essential (primary) hypertension: Secondary | ICD-10-CM | POA: Diagnosis not present

## 2019-11-09 DIAGNOSIS — I10 Essential (primary) hypertension: Secondary | ICD-10-CM | POA: Diagnosis not present

## 2019-11-10 DIAGNOSIS — I10 Essential (primary) hypertension: Secondary | ICD-10-CM | POA: Diagnosis not present

## 2019-11-11 DIAGNOSIS — I10 Essential (primary) hypertension: Secondary | ICD-10-CM | POA: Diagnosis not present

## 2019-11-12 DIAGNOSIS — I10 Essential (primary) hypertension: Secondary | ICD-10-CM | POA: Diagnosis not present

## 2019-11-13 DIAGNOSIS — I10 Essential (primary) hypertension: Secondary | ICD-10-CM | POA: Diagnosis not present

## 2019-11-14 DIAGNOSIS — I10 Essential (primary) hypertension: Secondary | ICD-10-CM | POA: Diagnosis not present

## 2019-11-15 DIAGNOSIS — I10 Essential (primary) hypertension: Secondary | ICD-10-CM | POA: Diagnosis not present

## 2019-11-16 DIAGNOSIS — I10 Essential (primary) hypertension: Secondary | ICD-10-CM | POA: Diagnosis not present

## 2019-11-17 DIAGNOSIS — I10 Essential (primary) hypertension: Secondary | ICD-10-CM | POA: Diagnosis not present

## 2019-11-18 DIAGNOSIS — I10 Essential (primary) hypertension: Secondary | ICD-10-CM | POA: Diagnosis not present

## 2019-11-19 DIAGNOSIS — I10 Essential (primary) hypertension: Secondary | ICD-10-CM | POA: Diagnosis not present

## 2019-11-20 DIAGNOSIS — I10 Essential (primary) hypertension: Secondary | ICD-10-CM | POA: Diagnosis not present

## 2019-11-20 DIAGNOSIS — G8190 Hemiplegia, unspecified affecting unspecified side: Secondary | ICD-10-CM | POA: Diagnosis not present

## 2019-11-20 DIAGNOSIS — F339 Major depressive disorder, recurrent, unspecified: Secondary | ICD-10-CM | POA: Diagnosis not present

## 2019-11-20 DIAGNOSIS — G894 Chronic pain syndrome: Secondary | ICD-10-CM | POA: Diagnosis not present

## 2019-11-21 DIAGNOSIS — I10 Essential (primary) hypertension: Secondary | ICD-10-CM | POA: Diagnosis not present

## 2019-11-22 DIAGNOSIS — I10 Essential (primary) hypertension: Secondary | ICD-10-CM | POA: Diagnosis not present

## 2019-11-23 DIAGNOSIS — I10 Essential (primary) hypertension: Secondary | ICD-10-CM | POA: Diagnosis not present

## 2019-11-24 DIAGNOSIS — I10 Essential (primary) hypertension: Secondary | ICD-10-CM | POA: Diagnosis not present

## 2019-11-25 DIAGNOSIS — I10 Essential (primary) hypertension: Secondary | ICD-10-CM | POA: Diagnosis not present

## 2019-11-26 DIAGNOSIS — I10 Essential (primary) hypertension: Secondary | ICD-10-CM | POA: Diagnosis not present

## 2019-11-27 DIAGNOSIS — I10 Essential (primary) hypertension: Secondary | ICD-10-CM | POA: Diagnosis not present

## 2019-11-28 DIAGNOSIS — I10 Essential (primary) hypertension: Secondary | ICD-10-CM | POA: Diagnosis not present

## 2019-11-29 DIAGNOSIS — I10 Essential (primary) hypertension: Secondary | ICD-10-CM | POA: Diagnosis not present

## 2019-11-30 DIAGNOSIS — I10 Essential (primary) hypertension: Secondary | ICD-10-CM | POA: Diagnosis not present

## 2019-12-01 DIAGNOSIS — I10 Essential (primary) hypertension: Secondary | ICD-10-CM | POA: Diagnosis not present

## 2019-12-02 DIAGNOSIS — I10 Essential (primary) hypertension: Secondary | ICD-10-CM | POA: Diagnosis not present

## 2019-12-03 DIAGNOSIS — I10 Essential (primary) hypertension: Secondary | ICD-10-CM | POA: Diagnosis not present

## 2019-12-04 DIAGNOSIS — I10 Essential (primary) hypertension: Secondary | ICD-10-CM | POA: Diagnosis not present

## 2019-12-05 DIAGNOSIS — I10 Essential (primary) hypertension: Secondary | ICD-10-CM | POA: Diagnosis not present

## 2019-12-06 DIAGNOSIS — I10 Essential (primary) hypertension: Secondary | ICD-10-CM | POA: Diagnosis not present

## 2019-12-07 DIAGNOSIS — I10 Essential (primary) hypertension: Secondary | ICD-10-CM | POA: Diagnosis not present

## 2019-12-08 DIAGNOSIS — I10 Essential (primary) hypertension: Secondary | ICD-10-CM | POA: Diagnosis not present

## 2019-12-09 DIAGNOSIS — I10 Essential (primary) hypertension: Secondary | ICD-10-CM | POA: Diagnosis not present

## 2019-12-10 DIAGNOSIS — I10 Essential (primary) hypertension: Secondary | ICD-10-CM | POA: Diagnosis not present

## 2019-12-11 DIAGNOSIS — I10 Essential (primary) hypertension: Secondary | ICD-10-CM | POA: Diagnosis not present

## 2019-12-12 DIAGNOSIS — I10 Essential (primary) hypertension: Secondary | ICD-10-CM | POA: Diagnosis not present

## 2019-12-13 DIAGNOSIS — I10 Essential (primary) hypertension: Secondary | ICD-10-CM | POA: Diagnosis not present

## 2019-12-14 DIAGNOSIS — I10 Essential (primary) hypertension: Secondary | ICD-10-CM | POA: Diagnosis not present

## 2019-12-15 DIAGNOSIS — I10 Essential (primary) hypertension: Secondary | ICD-10-CM | POA: Diagnosis not present

## 2019-12-16 DIAGNOSIS — I10 Essential (primary) hypertension: Secondary | ICD-10-CM | POA: Diagnosis not present

## 2019-12-17 DIAGNOSIS — I10 Essential (primary) hypertension: Secondary | ICD-10-CM | POA: Diagnosis not present

## 2019-12-18 DIAGNOSIS — I10 Essential (primary) hypertension: Secondary | ICD-10-CM | POA: Diagnosis not present

## 2019-12-19 DIAGNOSIS — I10 Essential (primary) hypertension: Secondary | ICD-10-CM | POA: Diagnosis not present

## 2019-12-20 DIAGNOSIS — I10 Essential (primary) hypertension: Secondary | ICD-10-CM | POA: Diagnosis not present

## 2019-12-21 DIAGNOSIS — I10 Essential (primary) hypertension: Secondary | ICD-10-CM | POA: Diagnosis not present

## 2019-12-22 DIAGNOSIS — I10 Essential (primary) hypertension: Secondary | ICD-10-CM | POA: Diagnosis not present

## 2019-12-23 DIAGNOSIS — I10 Essential (primary) hypertension: Secondary | ICD-10-CM | POA: Diagnosis not present

## 2019-12-24 DIAGNOSIS — I10 Essential (primary) hypertension: Secondary | ICD-10-CM | POA: Diagnosis not present

## 2019-12-25 DIAGNOSIS — I10 Essential (primary) hypertension: Secondary | ICD-10-CM | POA: Diagnosis not present

## 2019-12-26 DIAGNOSIS — I10 Essential (primary) hypertension: Secondary | ICD-10-CM | POA: Diagnosis not present

## 2019-12-27 DIAGNOSIS — I10 Essential (primary) hypertension: Secondary | ICD-10-CM | POA: Diagnosis not present

## 2019-12-28 DIAGNOSIS — I10 Essential (primary) hypertension: Secondary | ICD-10-CM | POA: Diagnosis not present

## 2019-12-29 DIAGNOSIS — I10 Essential (primary) hypertension: Secondary | ICD-10-CM | POA: Diagnosis not present

## 2019-12-30 DIAGNOSIS — I10 Essential (primary) hypertension: Secondary | ICD-10-CM | POA: Diagnosis not present

## 2019-12-31 DIAGNOSIS — I10 Essential (primary) hypertension: Secondary | ICD-10-CM | POA: Diagnosis not present

## 2020-01-01 DIAGNOSIS — I10 Essential (primary) hypertension: Secondary | ICD-10-CM | POA: Diagnosis not present

## 2020-01-02 DIAGNOSIS — I10 Essential (primary) hypertension: Secondary | ICD-10-CM | POA: Diagnosis not present

## 2020-01-03 DIAGNOSIS — I10 Essential (primary) hypertension: Secondary | ICD-10-CM | POA: Diagnosis not present

## 2020-01-04 DIAGNOSIS — I10 Essential (primary) hypertension: Secondary | ICD-10-CM | POA: Diagnosis not present

## 2020-01-05 DIAGNOSIS — I10 Essential (primary) hypertension: Secondary | ICD-10-CM | POA: Diagnosis not present

## 2020-01-13 DIAGNOSIS — Z9181 History of falling: Secondary | ICD-10-CM | POA: Diagnosis not present

## 2020-01-13 DIAGNOSIS — M6281 Muscle weakness (generalized): Secondary | ICD-10-CM | POA: Diagnosis not present

## 2020-01-13 DIAGNOSIS — I10 Essential (primary) hypertension: Secondary | ICD-10-CM | POA: Diagnosis not present

## 2020-01-13 DIAGNOSIS — F419 Anxiety disorder, unspecified: Secondary | ICD-10-CM | POA: Diagnosis not present

## 2020-01-13 DIAGNOSIS — Z7901 Long term (current) use of anticoagulants: Secondary | ICD-10-CM | POA: Diagnosis not present

## 2020-01-13 DIAGNOSIS — E559 Vitamin D deficiency, unspecified: Secondary | ICD-10-CM | POA: Diagnosis not present

## 2020-01-13 DIAGNOSIS — K5909 Other constipation: Secondary | ICD-10-CM | POA: Diagnosis not present

## 2020-01-13 DIAGNOSIS — Z7982 Long term (current) use of aspirin: Secondary | ICD-10-CM | POA: Diagnosis not present

## 2020-01-13 DIAGNOSIS — E785 Hyperlipidemia, unspecified: Secondary | ICD-10-CM | POA: Diagnosis not present

## 2020-01-13 DIAGNOSIS — G4700 Insomnia, unspecified: Secondary | ICD-10-CM | POA: Diagnosis not present

## 2020-02-24 DIAGNOSIS — F419 Anxiety disorder, unspecified: Secondary | ICD-10-CM | POA: Diagnosis not present

## 2020-02-24 DIAGNOSIS — E559 Vitamin D deficiency, unspecified: Secondary | ICD-10-CM | POA: Diagnosis not present

## 2020-02-24 DIAGNOSIS — Z9181 History of falling: Secondary | ICD-10-CM | POA: Diagnosis not present

## 2020-02-24 DIAGNOSIS — K5909 Other constipation: Secondary | ICD-10-CM | POA: Diagnosis not present

## 2020-02-24 DIAGNOSIS — I10 Essential (primary) hypertension: Secondary | ICD-10-CM | POA: Diagnosis not present

## 2020-02-24 DIAGNOSIS — G4700 Insomnia, unspecified: Secondary | ICD-10-CM | POA: Diagnosis not present

## 2020-02-24 DIAGNOSIS — Z7901 Long term (current) use of anticoagulants: Secondary | ICD-10-CM | POA: Diagnosis not present

## 2020-02-24 DIAGNOSIS — E785 Hyperlipidemia, unspecified: Secondary | ICD-10-CM | POA: Diagnosis not present

## 2020-02-24 DIAGNOSIS — M6281 Muscle weakness (generalized): Secondary | ICD-10-CM | POA: Diagnosis not present

## 2020-02-24 DIAGNOSIS — Z7982 Long term (current) use of aspirin: Secondary | ICD-10-CM | POA: Diagnosis not present

## 2020-03-16 DIAGNOSIS — E559 Vitamin D deficiency, unspecified: Secondary | ICD-10-CM | POA: Diagnosis not present

## 2020-03-16 DIAGNOSIS — M6281 Muscle weakness (generalized): Secondary | ICD-10-CM | POA: Diagnosis not present

## 2020-03-16 DIAGNOSIS — Z9181 History of falling: Secondary | ICD-10-CM | POA: Diagnosis not present

## 2020-03-16 DIAGNOSIS — E785 Hyperlipidemia, unspecified: Secondary | ICD-10-CM | POA: Diagnosis not present

## 2020-03-16 DIAGNOSIS — Z7982 Long term (current) use of aspirin: Secondary | ICD-10-CM | POA: Diagnosis not present

## 2020-03-16 DIAGNOSIS — F419 Anxiety disorder, unspecified: Secondary | ICD-10-CM | POA: Diagnosis not present

## 2020-03-16 DIAGNOSIS — G4700 Insomnia, unspecified: Secondary | ICD-10-CM | POA: Diagnosis not present

## 2020-03-16 DIAGNOSIS — Z7901 Long term (current) use of anticoagulants: Secondary | ICD-10-CM | POA: Diagnosis not present

## 2020-03-16 DIAGNOSIS — I10 Essential (primary) hypertension: Secondary | ICD-10-CM | POA: Diagnosis not present

## 2020-03-16 DIAGNOSIS — K5909 Other constipation: Secondary | ICD-10-CM | POA: Diagnosis not present

## 2020-04-06 DIAGNOSIS — Z7982 Long term (current) use of aspirin: Secondary | ICD-10-CM | POA: Diagnosis not present

## 2020-04-06 DIAGNOSIS — M6281 Muscle weakness (generalized): Secondary | ICD-10-CM | POA: Diagnosis not present

## 2020-04-06 DIAGNOSIS — Z9181 History of falling: Secondary | ICD-10-CM | POA: Diagnosis not present

## 2020-04-06 DIAGNOSIS — I10 Essential (primary) hypertension: Secondary | ICD-10-CM | POA: Diagnosis not present

## 2020-04-06 DIAGNOSIS — K5909 Other constipation: Secondary | ICD-10-CM | POA: Diagnosis not present

## 2020-04-06 DIAGNOSIS — Z7901 Long term (current) use of anticoagulants: Secondary | ICD-10-CM | POA: Diagnosis not present

## 2020-04-06 DIAGNOSIS — F419 Anxiety disorder, unspecified: Secondary | ICD-10-CM | POA: Diagnosis not present

## 2020-04-06 DIAGNOSIS — E559 Vitamin D deficiency, unspecified: Secondary | ICD-10-CM | POA: Diagnosis not present

## 2020-04-06 DIAGNOSIS — G4700 Insomnia, unspecified: Secondary | ICD-10-CM | POA: Diagnosis not present

## 2020-05-04 DIAGNOSIS — M6281 Muscle weakness (generalized): Secondary | ICD-10-CM | POA: Diagnosis not present

## 2020-05-04 DIAGNOSIS — G4700 Insomnia, unspecified: Secondary | ICD-10-CM | POA: Diagnosis not present

## 2020-05-04 DIAGNOSIS — Z7982 Long term (current) use of aspirin: Secondary | ICD-10-CM | POA: Diagnosis not present

## 2020-05-04 DIAGNOSIS — Z7901 Long term (current) use of anticoagulants: Secondary | ICD-10-CM | POA: Diagnosis not present

## 2020-05-04 DIAGNOSIS — E785 Hyperlipidemia, unspecified: Secondary | ICD-10-CM | POA: Diagnosis not present

## 2020-05-04 DIAGNOSIS — F419 Anxiety disorder, unspecified: Secondary | ICD-10-CM | POA: Diagnosis not present

## 2020-05-04 DIAGNOSIS — E559 Vitamin D deficiency, unspecified: Secondary | ICD-10-CM | POA: Diagnosis not present

## 2020-05-04 DIAGNOSIS — Z9181 History of falling: Secondary | ICD-10-CM | POA: Diagnosis not present

## 2020-05-04 DIAGNOSIS — K5909 Other constipation: Secondary | ICD-10-CM | POA: Diagnosis not present

## 2020-05-04 DIAGNOSIS — I10 Essential (primary) hypertension: Secondary | ICD-10-CM | POA: Diagnosis not present

## 2020-06-08 DIAGNOSIS — I10 Essential (primary) hypertension: Secondary | ICD-10-CM | POA: Diagnosis not present

## 2020-06-08 DIAGNOSIS — F419 Anxiety disorder, unspecified: Secondary | ICD-10-CM | POA: Diagnosis not present

## 2020-06-08 DIAGNOSIS — Z7901 Long term (current) use of anticoagulants: Secondary | ICD-10-CM | POA: Diagnosis not present

## 2020-06-08 DIAGNOSIS — Z7982 Long term (current) use of aspirin: Secondary | ICD-10-CM | POA: Diagnosis not present

## 2020-06-08 DIAGNOSIS — G4700 Insomnia, unspecified: Secondary | ICD-10-CM | POA: Diagnosis not present

## 2020-06-08 DIAGNOSIS — E785 Hyperlipidemia, unspecified: Secondary | ICD-10-CM | POA: Diagnosis not present

## 2020-06-08 DIAGNOSIS — K5909 Other constipation: Secondary | ICD-10-CM | POA: Diagnosis not present

## 2020-06-08 DIAGNOSIS — E559 Vitamin D deficiency, unspecified: Secondary | ICD-10-CM | POA: Diagnosis not present

## 2020-06-08 DIAGNOSIS — Z9181 History of falling: Secondary | ICD-10-CM | POA: Diagnosis not present

## 2020-06-08 DIAGNOSIS — M6281 Muscle weakness (generalized): Secondary | ICD-10-CM | POA: Diagnosis not present

## 2020-06-22 DIAGNOSIS — M6281 Muscle weakness (generalized): Secondary | ICD-10-CM | POA: Diagnosis not present

## 2020-06-22 DIAGNOSIS — Z7982 Long term (current) use of aspirin: Secondary | ICD-10-CM | POA: Diagnosis not present

## 2020-06-22 DIAGNOSIS — E559 Vitamin D deficiency, unspecified: Secondary | ICD-10-CM | POA: Diagnosis not present

## 2020-06-22 DIAGNOSIS — E785 Hyperlipidemia, unspecified: Secondary | ICD-10-CM | POA: Diagnosis not present

## 2020-06-22 DIAGNOSIS — I10 Essential (primary) hypertension: Secondary | ICD-10-CM | POA: Diagnosis not present

## 2020-06-22 DIAGNOSIS — F419 Anxiety disorder, unspecified: Secondary | ICD-10-CM | POA: Diagnosis not present

## 2020-06-22 DIAGNOSIS — Z7901 Long term (current) use of anticoagulants: Secondary | ICD-10-CM | POA: Diagnosis not present

## 2020-06-22 DIAGNOSIS — G4700 Insomnia, unspecified: Secondary | ICD-10-CM | POA: Diagnosis not present

## 2020-06-22 DIAGNOSIS — L299 Pruritus, unspecified: Secondary | ICD-10-CM | POA: Diagnosis not present

## 2020-06-22 DIAGNOSIS — Z9181 History of falling: Secondary | ICD-10-CM | POA: Diagnosis not present

## 2020-06-22 DIAGNOSIS — K5909 Other constipation: Secondary | ICD-10-CM | POA: Diagnosis not present

## 2020-07-06 DIAGNOSIS — K5909 Other constipation: Secondary | ICD-10-CM | POA: Diagnosis not present

## 2020-07-06 DIAGNOSIS — F419 Anxiety disorder, unspecified: Secondary | ICD-10-CM | POA: Diagnosis not present

## 2020-07-06 DIAGNOSIS — Z7901 Long term (current) use of anticoagulants: Secondary | ICD-10-CM | POA: Diagnosis not present

## 2020-07-06 DIAGNOSIS — G4700 Insomnia, unspecified: Secondary | ICD-10-CM | POA: Diagnosis not present

## 2020-07-06 DIAGNOSIS — Z9181 History of falling: Secondary | ICD-10-CM | POA: Diagnosis not present

## 2020-07-06 DIAGNOSIS — E785 Hyperlipidemia, unspecified: Secondary | ICD-10-CM | POA: Diagnosis not present

## 2020-07-06 DIAGNOSIS — Z7982 Long term (current) use of aspirin: Secondary | ICD-10-CM | POA: Diagnosis not present

## 2020-07-06 DIAGNOSIS — E559 Vitamin D deficiency, unspecified: Secondary | ICD-10-CM | POA: Diagnosis not present

## 2020-07-06 DIAGNOSIS — I10 Essential (primary) hypertension: Secondary | ICD-10-CM | POA: Diagnosis not present

## 2020-07-06 DIAGNOSIS — M6281 Muscle weakness (generalized): Secondary | ICD-10-CM | POA: Diagnosis not present

## 2020-07-11 DIAGNOSIS — I10 Essential (primary) hypertension: Secondary | ICD-10-CM | POA: Diagnosis not present

## 2020-07-11 DIAGNOSIS — E785 Hyperlipidemia, unspecified: Secondary | ICD-10-CM | POA: Diagnosis not present

## 2020-07-11 DIAGNOSIS — K5909 Other constipation: Secondary | ICD-10-CM | POA: Diagnosis not present

## 2020-07-11 DIAGNOSIS — G4700 Insomnia, unspecified: Secondary | ICD-10-CM | POA: Diagnosis not present

## 2020-07-11 DIAGNOSIS — Z7982 Long term (current) use of aspirin: Secondary | ICD-10-CM | POA: Diagnosis not present

## 2020-07-11 DIAGNOSIS — Z7901 Long term (current) use of anticoagulants: Secondary | ICD-10-CM | POA: Diagnosis not present

## 2020-07-11 DIAGNOSIS — M6281 Muscle weakness (generalized): Secondary | ICD-10-CM | POA: Diagnosis not present

## 2020-07-11 DIAGNOSIS — E559 Vitamin D deficiency, unspecified: Secondary | ICD-10-CM | POA: Diagnosis not present

## 2020-07-11 DIAGNOSIS — Z9181 History of falling: Secondary | ICD-10-CM | POA: Diagnosis not present

## 2020-08-03 DIAGNOSIS — K5909 Other constipation: Secondary | ICD-10-CM | POA: Diagnosis not present

## 2020-08-03 DIAGNOSIS — F419 Anxiety disorder, unspecified: Secondary | ICD-10-CM | POA: Diagnosis not present

## 2020-08-03 DIAGNOSIS — I10 Essential (primary) hypertension: Secondary | ICD-10-CM | POA: Diagnosis not present

## 2020-08-03 DIAGNOSIS — Z7982 Long term (current) use of aspirin: Secondary | ICD-10-CM | POA: Diagnosis not present

## 2020-08-03 DIAGNOSIS — E785 Hyperlipidemia, unspecified: Secondary | ICD-10-CM | POA: Diagnosis not present

## 2020-08-03 DIAGNOSIS — M6281 Muscle weakness (generalized): Secondary | ICD-10-CM | POA: Diagnosis not present

## 2020-08-03 DIAGNOSIS — E559 Vitamin D deficiency, unspecified: Secondary | ICD-10-CM | POA: Diagnosis not present

## 2020-08-03 DIAGNOSIS — Z9181 History of falling: Secondary | ICD-10-CM | POA: Diagnosis not present

## 2020-08-03 DIAGNOSIS — G4700 Insomnia, unspecified: Secondary | ICD-10-CM | POA: Diagnosis not present

## 2020-08-03 DIAGNOSIS — Z7901 Long term (current) use of anticoagulants: Secondary | ICD-10-CM | POA: Diagnosis not present

## 2020-08-31 DIAGNOSIS — G4700 Insomnia, unspecified: Secondary | ICD-10-CM | POA: Diagnosis not present

## 2020-08-31 DIAGNOSIS — K5909 Other constipation: Secondary | ICD-10-CM | POA: Diagnosis not present

## 2020-08-31 DIAGNOSIS — Z7982 Long term (current) use of aspirin: Secondary | ICD-10-CM | POA: Diagnosis not present

## 2020-08-31 DIAGNOSIS — I10 Essential (primary) hypertension: Secondary | ICD-10-CM | POA: Diagnosis not present

## 2020-08-31 DIAGNOSIS — M6281 Muscle weakness (generalized): Secondary | ICD-10-CM | POA: Diagnosis not present

## 2020-08-31 DIAGNOSIS — Z7901 Long term (current) use of anticoagulants: Secondary | ICD-10-CM | POA: Diagnosis not present

## 2020-08-31 DIAGNOSIS — Z9181 History of falling: Secondary | ICD-10-CM | POA: Diagnosis not present

## 2020-08-31 DIAGNOSIS — E559 Vitamin D deficiency, unspecified: Secondary | ICD-10-CM | POA: Diagnosis not present

## 2020-08-31 DIAGNOSIS — F419 Anxiety disorder, unspecified: Secondary | ICD-10-CM | POA: Diagnosis not present

## 2020-08-31 DIAGNOSIS — E785 Hyperlipidemia, unspecified: Secondary | ICD-10-CM | POA: Diagnosis not present

## 2020-09-28 DIAGNOSIS — F419 Anxiety disorder, unspecified: Secondary | ICD-10-CM | POA: Diagnosis not present

## 2020-09-28 DIAGNOSIS — E559 Vitamin D deficiency, unspecified: Secondary | ICD-10-CM | POA: Diagnosis not present

## 2020-09-28 DIAGNOSIS — G4700 Insomnia, unspecified: Secondary | ICD-10-CM | POA: Diagnosis not present

## 2020-09-28 DIAGNOSIS — Z9181 History of falling: Secondary | ICD-10-CM | POA: Diagnosis not present

## 2020-09-28 DIAGNOSIS — K5909 Other constipation: Secondary | ICD-10-CM | POA: Diagnosis not present

## 2020-09-28 DIAGNOSIS — E785 Hyperlipidemia, unspecified: Secondary | ICD-10-CM | POA: Diagnosis not present

## 2020-09-28 DIAGNOSIS — Z7982 Long term (current) use of aspirin: Secondary | ICD-10-CM | POA: Diagnosis not present

## 2020-09-28 DIAGNOSIS — Z7901 Long term (current) use of anticoagulants: Secondary | ICD-10-CM | POA: Diagnosis not present

## 2020-09-28 DIAGNOSIS — M6281 Muscle weakness (generalized): Secondary | ICD-10-CM | POA: Diagnosis not present

## 2020-09-28 DIAGNOSIS — I10 Essential (primary) hypertension: Secondary | ICD-10-CM | POA: Diagnosis not present

## 2020-10-27 DIAGNOSIS — Z7982 Long term (current) use of aspirin: Secondary | ICD-10-CM | POA: Diagnosis not present

## 2020-10-27 DIAGNOSIS — Z7901 Long term (current) use of anticoagulants: Secondary | ICD-10-CM | POA: Diagnosis not present

## 2020-10-27 DIAGNOSIS — Z9181 History of falling: Secondary | ICD-10-CM | POA: Diagnosis not present

## 2020-10-27 DIAGNOSIS — F419 Anxiety disorder, unspecified: Secondary | ICD-10-CM | POA: Diagnosis not present

## 2020-10-27 DIAGNOSIS — I10 Essential (primary) hypertension: Secondary | ICD-10-CM | POA: Diagnosis not present

## 2020-10-27 DIAGNOSIS — M6281 Muscle weakness (generalized): Secondary | ICD-10-CM | POA: Diagnosis not present

## 2020-10-27 DIAGNOSIS — E785 Hyperlipidemia, unspecified: Secondary | ICD-10-CM | POA: Diagnosis not present

## 2020-10-27 DIAGNOSIS — E559 Vitamin D deficiency, unspecified: Secondary | ICD-10-CM | POA: Diagnosis not present

## 2020-10-27 DIAGNOSIS — K5909 Other constipation: Secondary | ICD-10-CM | POA: Diagnosis not present

## 2020-10-27 DIAGNOSIS — G4700 Insomnia, unspecified: Secondary | ICD-10-CM | POA: Diagnosis not present

## 2020-12-23 DIAGNOSIS — E559 Vitamin D deficiency, unspecified: Secondary | ICD-10-CM | POA: Diagnosis not present

## 2020-12-23 DIAGNOSIS — G4700 Insomnia, unspecified: Secondary | ICD-10-CM | POA: Diagnosis not present

## 2020-12-23 DIAGNOSIS — K5909 Other constipation: Secondary | ICD-10-CM | POA: Diagnosis not present

## 2020-12-23 DIAGNOSIS — F419 Anxiety disorder, unspecified: Secondary | ICD-10-CM | POA: Diagnosis not present

## 2020-12-23 DIAGNOSIS — Z9181 History of falling: Secondary | ICD-10-CM | POA: Diagnosis not present

## 2020-12-23 DIAGNOSIS — Z7901 Long term (current) use of anticoagulants: Secondary | ICD-10-CM | POA: Diagnosis not present

## 2020-12-23 DIAGNOSIS — I10 Essential (primary) hypertension: Secondary | ICD-10-CM | POA: Diagnosis not present

## 2020-12-23 DIAGNOSIS — E785 Hyperlipidemia, unspecified: Secondary | ICD-10-CM | POA: Diagnosis not present

## 2020-12-23 DIAGNOSIS — Z7982 Long term (current) use of aspirin: Secondary | ICD-10-CM | POA: Diagnosis not present

## 2023-02-15 DIAGNOSIS — F32A Depression, unspecified: Secondary | ICD-10-CM | POA: Diagnosis not present

## 2023-02-15 DIAGNOSIS — Z79899 Other long term (current) drug therapy: Secondary | ICD-10-CM | POA: Diagnosis not present

## 2023-02-15 DIAGNOSIS — F419 Anxiety disorder, unspecified: Secondary | ICD-10-CM | POA: Diagnosis not present

## 2023-02-15 DIAGNOSIS — F1021 Alcohol dependence, in remission: Secondary | ICD-10-CM | POA: Diagnosis not present

## 2023-02-15 DIAGNOSIS — F01B0 Vascular dementia, moderate, without behavioral disturbance, psychotic disturbance, mood disturbance, and anxiety: Secondary | ICD-10-CM | POA: Diagnosis not present

## 2023-02-25 DIAGNOSIS — Z9181 History of falling: Secondary | ICD-10-CM | POA: Diagnosis not present

## 2023-02-25 DIAGNOSIS — E559 Vitamin D deficiency, unspecified: Secondary | ICD-10-CM | POA: Diagnosis not present

## 2023-02-25 DIAGNOSIS — Z7982 Long term (current) use of aspirin: Secondary | ICD-10-CM | POA: Diagnosis not present

## 2023-02-25 DIAGNOSIS — I10 Essential (primary) hypertension: Secondary | ICD-10-CM | POA: Diagnosis not present

## 2023-02-25 DIAGNOSIS — Z7901 Long term (current) use of anticoagulants: Secondary | ICD-10-CM | POA: Diagnosis not present

## 2023-02-25 DIAGNOSIS — G4700 Insomnia, unspecified: Secondary | ICD-10-CM | POA: Diagnosis not present

## 2023-02-25 DIAGNOSIS — E785 Hyperlipidemia, unspecified: Secondary | ICD-10-CM | POA: Diagnosis not present

## 2023-02-25 DIAGNOSIS — J309 Allergic rhinitis, unspecified: Secondary | ICD-10-CM | POA: Diagnosis not present

## 2023-02-25 DIAGNOSIS — K5909 Other constipation: Secondary | ICD-10-CM | POA: Diagnosis not present

## 2023-02-25 DIAGNOSIS — F419 Anxiety disorder, unspecified: Secondary | ICD-10-CM | POA: Diagnosis not present

## 2023-03-08 DIAGNOSIS — E119 Type 2 diabetes mellitus without complications: Secondary | ICD-10-CM | POA: Diagnosis not present

## 2023-03-11 DIAGNOSIS — J309 Allergic rhinitis, unspecified: Secondary | ICD-10-CM | POA: Diagnosis not present

## 2023-03-11 DIAGNOSIS — Z7901 Long term (current) use of anticoagulants: Secondary | ICD-10-CM | POA: Diagnosis not present

## 2023-03-11 DIAGNOSIS — G4700 Insomnia, unspecified: Secondary | ICD-10-CM | POA: Diagnosis not present

## 2023-03-11 DIAGNOSIS — K5909 Other constipation: Secondary | ICD-10-CM | POA: Diagnosis not present

## 2023-03-11 DIAGNOSIS — Z7982 Long term (current) use of aspirin: Secondary | ICD-10-CM | POA: Diagnosis not present

## 2023-03-11 DIAGNOSIS — E785 Hyperlipidemia, unspecified: Secondary | ICD-10-CM | POA: Diagnosis not present

## 2023-03-11 DIAGNOSIS — I10 Essential (primary) hypertension: Secondary | ICD-10-CM | POA: Diagnosis not present

## 2023-03-11 DIAGNOSIS — F419 Anxiety disorder, unspecified: Secondary | ICD-10-CM | POA: Diagnosis not present

## 2023-03-11 DIAGNOSIS — Z9181 History of falling: Secondary | ICD-10-CM | POA: Diagnosis not present

## 2023-03-11 DIAGNOSIS — E559 Vitamin D deficiency, unspecified: Secondary | ICD-10-CM | POA: Diagnosis not present

## 2023-03-22 DIAGNOSIS — F32A Depression, unspecified: Secondary | ICD-10-CM | POA: Diagnosis not present

## 2023-03-22 DIAGNOSIS — F01B0 Vascular dementia, moderate, without behavioral disturbance, psychotic disturbance, mood disturbance, and anxiety: Secondary | ICD-10-CM | POA: Diagnosis not present

## 2023-03-22 DIAGNOSIS — Z79899 Other long term (current) drug therapy: Secondary | ICD-10-CM | POA: Diagnosis not present

## 2023-03-22 DIAGNOSIS — F1021 Alcohol dependence, in remission: Secondary | ICD-10-CM | POA: Diagnosis not present

## 2023-03-22 DIAGNOSIS — F419 Anxiety disorder, unspecified: Secondary | ICD-10-CM | POA: Diagnosis not present

## 2023-03-25 DIAGNOSIS — Z9181 History of falling: Secondary | ICD-10-CM | POA: Diagnosis not present

## 2023-03-25 DIAGNOSIS — Z7982 Long term (current) use of aspirin: Secondary | ICD-10-CM | POA: Diagnosis not present

## 2023-03-25 DIAGNOSIS — Z7901 Long term (current) use of anticoagulants: Secondary | ICD-10-CM | POA: Diagnosis not present

## 2023-03-25 DIAGNOSIS — E785 Hyperlipidemia, unspecified: Secondary | ICD-10-CM | POA: Diagnosis not present

## 2023-03-25 DIAGNOSIS — F419 Anxiety disorder, unspecified: Secondary | ICD-10-CM | POA: Diagnosis not present

## 2023-03-25 DIAGNOSIS — I10 Essential (primary) hypertension: Secondary | ICD-10-CM | POA: Diagnosis not present

## 2023-03-25 DIAGNOSIS — K5909 Other constipation: Secondary | ICD-10-CM | POA: Diagnosis not present

## 2023-03-25 DIAGNOSIS — J309 Allergic rhinitis, unspecified: Secondary | ICD-10-CM | POA: Diagnosis not present

## 2023-03-25 DIAGNOSIS — G4700 Insomnia, unspecified: Secondary | ICD-10-CM | POA: Diagnosis not present

## 2023-03-25 DIAGNOSIS — E559 Vitamin D deficiency, unspecified: Secondary | ICD-10-CM | POA: Diagnosis not present

## 2023-04-08 DIAGNOSIS — J309 Allergic rhinitis, unspecified: Secondary | ICD-10-CM | POA: Diagnosis not present

## 2023-04-08 DIAGNOSIS — E785 Hyperlipidemia, unspecified: Secondary | ICD-10-CM | POA: Diagnosis not present

## 2023-04-08 DIAGNOSIS — Z9181 History of falling: Secondary | ICD-10-CM | POA: Diagnosis not present

## 2023-04-08 DIAGNOSIS — Z7901 Long term (current) use of anticoagulants: Secondary | ICD-10-CM | POA: Diagnosis not present

## 2023-04-08 DIAGNOSIS — E559 Vitamin D deficiency, unspecified: Secondary | ICD-10-CM | POA: Diagnosis not present

## 2023-04-08 DIAGNOSIS — K5909 Other constipation: Secondary | ICD-10-CM | POA: Diagnosis not present

## 2023-04-08 DIAGNOSIS — I10 Essential (primary) hypertension: Secondary | ICD-10-CM | POA: Diagnosis not present

## 2023-04-08 DIAGNOSIS — G4700 Insomnia, unspecified: Secondary | ICD-10-CM | POA: Diagnosis not present

## 2023-04-08 DIAGNOSIS — F419 Anxiety disorder, unspecified: Secondary | ICD-10-CM | POA: Diagnosis not present

## 2023-04-08 DIAGNOSIS — Z7982 Long term (current) use of aspirin: Secondary | ICD-10-CM | POA: Diagnosis not present

## 2023-04-19 DIAGNOSIS — F32A Depression, unspecified: Secondary | ICD-10-CM | POA: Diagnosis not present

## 2023-04-19 DIAGNOSIS — F01B0 Vascular dementia, moderate, without behavioral disturbance, psychotic disturbance, mood disturbance, and anxiety: Secondary | ICD-10-CM | POA: Diagnosis not present

## 2023-04-19 DIAGNOSIS — F419 Anxiety disorder, unspecified: Secondary | ICD-10-CM | POA: Diagnosis not present

## 2023-04-19 DIAGNOSIS — Z79899 Other long term (current) drug therapy: Secondary | ICD-10-CM | POA: Diagnosis not present

## 2023-04-19 DIAGNOSIS — F1021 Alcohol dependence, in remission: Secondary | ICD-10-CM | POA: Diagnosis not present

## 2023-04-22 DIAGNOSIS — J309 Allergic rhinitis, unspecified: Secondary | ICD-10-CM | POA: Diagnosis not present

## 2023-04-22 DIAGNOSIS — E559 Vitamin D deficiency, unspecified: Secondary | ICD-10-CM | POA: Diagnosis not present

## 2023-04-22 DIAGNOSIS — G4700 Insomnia, unspecified: Secondary | ICD-10-CM | POA: Diagnosis not present

## 2023-04-22 DIAGNOSIS — Z7901 Long term (current) use of anticoagulants: Secondary | ICD-10-CM | POA: Diagnosis not present

## 2023-04-22 DIAGNOSIS — Z7982 Long term (current) use of aspirin: Secondary | ICD-10-CM | POA: Diagnosis not present

## 2023-04-22 DIAGNOSIS — K5909 Other constipation: Secondary | ICD-10-CM | POA: Diagnosis not present

## 2023-04-22 DIAGNOSIS — Z9181 History of falling: Secondary | ICD-10-CM | POA: Diagnosis not present

## 2023-04-22 DIAGNOSIS — I10 Essential (primary) hypertension: Secondary | ICD-10-CM | POA: Diagnosis not present

## 2023-04-22 DIAGNOSIS — F419 Anxiety disorder, unspecified: Secondary | ICD-10-CM | POA: Diagnosis not present

## 2023-04-22 DIAGNOSIS — E785 Hyperlipidemia, unspecified: Secondary | ICD-10-CM | POA: Diagnosis not present

## 2023-04-29 DIAGNOSIS — E785 Hyperlipidemia, unspecified: Secondary | ICD-10-CM | POA: Diagnosis not present

## 2023-04-29 DIAGNOSIS — Z7901 Long term (current) use of anticoagulants: Secondary | ICD-10-CM | POA: Diagnosis not present

## 2023-04-29 DIAGNOSIS — I10 Essential (primary) hypertension: Secondary | ICD-10-CM | POA: Diagnosis not present

## 2023-04-29 DIAGNOSIS — N183 Chronic kidney disease, stage 3 unspecified: Secondary | ICD-10-CM | POA: Diagnosis not present

## 2023-05-02 DIAGNOSIS — I10 Essential (primary) hypertension: Secondary | ICD-10-CM | POA: Diagnosis not present

## 2023-05-02 DIAGNOSIS — E785 Hyperlipidemia, unspecified: Secondary | ICD-10-CM | POA: Diagnosis not present

## 2023-05-02 DIAGNOSIS — N183 Chronic kidney disease, stage 3 unspecified: Secondary | ICD-10-CM | POA: Diagnosis not present

## 2023-05-02 DIAGNOSIS — Z7901 Long term (current) use of anticoagulants: Secondary | ICD-10-CM | POA: Diagnosis not present

## 2023-05-06 DIAGNOSIS — G4700 Insomnia, unspecified: Secondary | ICD-10-CM | POA: Diagnosis not present

## 2023-05-06 DIAGNOSIS — J309 Allergic rhinitis, unspecified: Secondary | ICD-10-CM | POA: Diagnosis not present

## 2023-05-06 DIAGNOSIS — Z7901 Long term (current) use of anticoagulants: Secondary | ICD-10-CM | POA: Diagnosis not present

## 2023-05-06 DIAGNOSIS — E559 Vitamin D deficiency, unspecified: Secondary | ICD-10-CM | POA: Diagnosis not present

## 2023-05-06 DIAGNOSIS — Z7982 Long term (current) use of aspirin: Secondary | ICD-10-CM | POA: Diagnosis not present

## 2023-05-06 DIAGNOSIS — F419 Anxiety disorder, unspecified: Secondary | ICD-10-CM | POA: Diagnosis not present

## 2023-05-06 DIAGNOSIS — Z9181 History of falling: Secondary | ICD-10-CM | POA: Diagnosis not present

## 2023-05-06 DIAGNOSIS — K5909 Other constipation: Secondary | ICD-10-CM | POA: Diagnosis not present

## 2023-05-06 DIAGNOSIS — E785 Hyperlipidemia, unspecified: Secondary | ICD-10-CM | POA: Diagnosis not present

## 2023-05-06 DIAGNOSIS — I10 Essential (primary) hypertension: Secondary | ICD-10-CM | POA: Diagnosis not present

## 2023-05-10 DIAGNOSIS — Z79899 Other long term (current) drug therapy: Secondary | ICD-10-CM | POA: Diagnosis not present

## 2023-05-17 DIAGNOSIS — F32A Depression, unspecified: Secondary | ICD-10-CM | POA: Diagnosis not present

## 2023-05-17 DIAGNOSIS — F01B0 Vascular dementia, moderate, without behavioral disturbance, psychotic disturbance, mood disturbance, and anxiety: Secondary | ICD-10-CM | POA: Diagnosis not present

## 2023-05-17 DIAGNOSIS — F1021 Alcohol dependence, in remission: Secondary | ICD-10-CM | POA: Diagnosis not present

## 2023-05-17 DIAGNOSIS — F419 Anxiety disorder, unspecified: Secondary | ICD-10-CM | POA: Diagnosis not present

## 2023-05-17 DIAGNOSIS — Z79899 Other long term (current) drug therapy: Secondary | ICD-10-CM | POA: Diagnosis not present

## 2023-05-20 DIAGNOSIS — J309 Allergic rhinitis, unspecified: Secondary | ICD-10-CM | POA: Diagnosis not present

## 2023-05-20 DIAGNOSIS — F419 Anxiety disorder, unspecified: Secondary | ICD-10-CM | POA: Diagnosis not present

## 2023-05-20 DIAGNOSIS — Z7901 Long term (current) use of anticoagulants: Secondary | ICD-10-CM | POA: Diagnosis not present

## 2023-05-20 DIAGNOSIS — E785 Hyperlipidemia, unspecified: Secondary | ICD-10-CM | POA: Diagnosis not present

## 2023-05-20 DIAGNOSIS — Z7982 Long term (current) use of aspirin: Secondary | ICD-10-CM | POA: Diagnosis not present

## 2023-05-20 DIAGNOSIS — Z9181 History of falling: Secondary | ICD-10-CM | POA: Diagnosis not present

## 2023-05-20 DIAGNOSIS — I10 Essential (primary) hypertension: Secondary | ICD-10-CM | POA: Diagnosis not present

## 2023-05-20 DIAGNOSIS — K5909 Other constipation: Secondary | ICD-10-CM | POA: Diagnosis not present

## 2023-05-20 DIAGNOSIS — E559 Vitamin D deficiency, unspecified: Secondary | ICD-10-CM | POA: Diagnosis not present

## 2023-05-20 DIAGNOSIS — G4700 Insomnia, unspecified: Secondary | ICD-10-CM | POA: Diagnosis not present

## 2023-05-29 DIAGNOSIS — Z7901 Long term (current) use of anticoagulants: Secondary | ICD-10-CM | POA: Diagnosis not present

## 2023-05-29 DIAGNOSIS — N183 Chronic kidney disease, stage 3 unspecified: Secondary | ICD-10-CM | POA: Diagnosis not present

## 2023-05-29 DIAGNOSIS — I10 Essential (primary) hypertension: Secondary | ICD-10-CM | POA: Diagnosis not present

## 2023-05-29 DIAGNOSIS — E785 Hyperlipidemia, unspecified: Secondary | ICD-10-CM | POA: Diagnosis not present

## 2023-06-03 DIAGNOSIS — E785 Hyperlipidemia, unspecified: Secondary | ICD-10-CM | POA: Diagnosis not present

## 2023-06-03 DIAGNOSIS — E559 Vitamin D deficiency, unspecified: Secondary | ICD-10-CM | POA: Diagnosis not present

## 2023-06-03 DIAGNOSIS — Z7982 Long term (current) use of aspirin: Secondary | ICD-10-CM | POA: Diagnosis not present

## 2023-06-03 DIAGNOSIS — G4700 Insomnia, unspecified: Secondary | ICD-10-CM | POA: Diagnosis not present

## 2023-06-03 DIAGNOSIS — F419 Anxiety disorder, unspecified: Secondary | ICD-10-CM | POA: Diagnosis not present

## 2023-06-03 DIAGNOSIS — Z9181 History of falling: Secondary | ICD-10-CM | POA: Diagnosis not present

## 2023-06-03 DIAGNOSIS — I10 Essential (primary) hypertension: Secondary | ICD-10-CM | POA: Diagnosis not present

## 2023-06-03 DIAGNOSIS — K5909 Other constipation: Secondary | ICD-10-CM | POA: Diagnosis not present

## 2023-06-03 DIAGNOSIS — Z7901 Long term (current) use of anticoagulants: Secondary | ICD-10-CM | POA: Diagnosis not present

## 2023-06-05 DIAGNOSIS — E785 Hyperlipidemia, unspecified: Secondary | ICD-10-CM | POA: Diagnosis not present

## 2023-06-05 DIAGNOSIS — N183 Chronic kidney disease, stage 3 unspecified: Secondary | ICD-10-CM | POA: Diagnosis not present

## 2023-06-05 DIAGNOSIS — Z7901 Long term (current) use of anticoagulants: Secondary | ICD-10-CM | POA: Diagnosis not present

## 2023-06-05 DIAGNOSIS — I10 Essential (primary) hypertension: Secondary | ICD-10-CM | POA: Diagnosis not present

## 2023-06-14 DIAGNOSIS — Z79899 Other long term (current) drug therapy: Secondary | ICD-10-CM | POA: Diagnosis not present

## 2023-06-17 DIAGNOSIS — J309 Allergic rhinitis, unspecified: Secondary | ICD-10-CM | POA: Diagnosis not present

## 2023-06-17 DIAGNOSIS — Z7901 Long term (current) use of anticoagulants: Secondary | ICD-10-CM | POA: Diagnosis not present

## 2023-06-17 DIAGNOSIS — F419 Anxiety disorder, unspecified: Secondary | ICD-10-CM | POA: Diagnosis not present

## 2023-06-17 DIAGNOSIS — Z9181 History of falling: Secondary | ICD-10-CM | POA: Diagnosis not present

## 2023-06-17 DIAGNOSIS — G4700 Insomnia, unspecified: Secondary | ICD-10-CM | POA: Diagnosis not present

## 2023-06-17 DIAGNOSIS — Z7982 Long term (current) use of aspirin: Secondary | ICD-10-CM | POA: Diagnosis not present

## 2023-06-17 DIAGNOSIS — E785 Hyperlipidemia, unspecified: Secondary | ICD-10-CM | POA: Diagnosis not present

## 2023-06-17 DIAGNOSIS — E559 Vitamin D deficiency, unspecified: Secondary | ICD-10-CM | POA: Diagnosis not present

## 2023-06-17 DIAGNOSIS — K5900 Constipation, unspecified: Secondary | ICD-10-CM | POA: Diagnosis not present

## 2023-06-17 DIAGNOSIS — I10 Essential (primary) hypertension: Secondary | ICD-10-CM | POA: Diagnosis not present

## 2023-06-26 DIAGNOSIS — Z7901 Long term (current) use of anticoagulants: Secondary | ICD-10-CM | POA: Diagnosis not present

## 2023-06-26 DIAGNOSIS — N183 Chronic kidney disease, stage 3 unspecified: Secondary | ICD-10-CM | POA: Diagnosis not present

## 2023-06-26 DIAGNOSIS — I10 Essential (primary) hypertension: Secondary | ICD-10-CM | POA: Diagnosis not present

## 2023-06-26 DIAGNOSIS — E785 Hyperlipidemia, unspecified: Secondary | ICD-10-CM | POA: Diagnosis not present

## 2023-06-29 DIAGNOSIS — N183 Chronic kidney disease, stage 3 unspecified: Secondary | ICD-10-CM | POA: Diagnosis not present

## 2023-06-29 DIAGNOSIS — I10 Essential (primary) hypertension: Secondary | ICD-10-CM | POA: Diagnosis not present

## 2023-06-29 DIAGNOSIS — Z7901 Long term (current) use of anticoagulants: Secondary | ICD-10-CM | POA: Diagnosis not present

## 2023-06-29 DIAGNOSIS — E785 Hyperlipidemia, unspecified: Secondary | ICD-10-CM | POA: Diagnosis not present

## 2023-07-01 DIAGNOSIS — J309 Allergic rhinitis, unspecified: Secondary | ICD-10-CM | POA: Diagnosis not present

## 2023-07-01 DIAGNOSIS — K5909 Other constipation: Secondary | ICD-10-CM | POA: Diagnosis not present

## 2023-07-01 DIAGNOSIS — Z9181 History of falling: Secondary | ICD-10-CM | POA: Diagnosis not present

## 2023-07-01 DIAGNOSIS — Z7901 Long term (current) use of anticoagulants: Secondary | ICD-10-CM | POA: Diagnosis not present

## 2023-07-01 DIAGNOSIS — E785 Hyperlipidemia, unspecified: Secondary | ICD-10-CM | POA: Diagnosis not present

## 2023-07-01 DIAGNOSIS — E559 Vitamin D deficiency, unspecified: Secondary | ICD-10-CM | POA: Diagnosis not present

## 2023-07-01 DIAGNOSIS — F419 Anxiety disorder, unspecified: Secondary | ICD-10-CM | POA: Diagnosis not present

## 2023-07-01 DIAGNOSIS — G4700 Insomnia, unspecified: Secondary | ICD-10-CM | POA: Diagnosis not present

## 2023-07-01 DIAGNOSIS — I10 Essential (primary) hypertension: Secondary | ICD-10-CM | POA: Diagnosis not present

## 2023-07-01 DIAGNOSIS — Z7982 Long term (current) use of aspirin: Secondary | ICD-10-CM | POA: Diagnosis not present

## 2023-07-15 DIAGNOSIS — K5909 Other constipation: Secondary | ICD-10-CM | POA: Diagnosis not present

## 2023-07-15 DIAGNOSIS — E559 Vitamin D deficiency, unspecified: Secondary | ICD-10-CM | POA: Diagnosis not present

## 2023-07-15 DIAGNOSIS — G4700 Insomnia, unspecified: Secondary | ICD-10-CM | POA: Diagnosis not present

## 2023-07-15 DIAGNOSIS — F419 Anxiety disorder, unspecified: Secondary | ICD-10-CM | POA: Diagnosis not present

## 2023-07-15 DIAGNOSIS — Z7901 Long term (current) use of anticoagulants: Secondary | ICD-10-CM | POA: Diagnosis not present

## 2023-07-15 DIAGNOSIS — E785 Hyperlipidemia, unspecified: Secondary | ICD-10-CM | POA: Diagnosis not present

## 2023-07-15 DIAGNOSIS — I10 Essential (primary) hypertension: Secondary | ICD-10-CM | POA: Diagnosis not present

## 2023-07-15 DIAGNOSIS — J309 Allergic rhinitis, unspecified: Secondary | ICD-10-CM | POA: Diagnosis not present

## 2023-07-15 DIAGNOSIS — Z7982 Long term (current) use of aspirin: Secondary | ICD-10-CM | POA: Diagnosis not present

## 2023-07-15 DIAGNOSIS — Z9181 History of falling: Secondary | ICD-10-CM | POA: Diagnosis not present

## 2023-07-19 DIAGNOSIS — Z79899 Other long term (current) drug therapy: Secondary | ICD-10-CM | POA: Diagnosis not present

## 2023-07-29 DIAGNOSIS — E559 Vitamin D deficiency, unspecified: Secondary | ICD-10-CM | POA: Diagnosis not present

## 2023-07-29 DIAGNOSIS — Z9181 History of falling: Secondary | ICD-10-CM | POA: Diagnosis not present

## 2023-07-29 DIAGNOSIS — G4700 Insomnia, unspecified: Secondary | ICD-10-CM | POA: Diagnosis not present

## 2023-07-29 DIAGNOSIS — Z7901 Long term (current) use of anticoagulants: Secondary | ICD-10-CM | POA: Diagnosis not present

## 2023-07-29 DIAGNOSIS — I10 Essential (primary) hypertension: Secondary | ICD-10-CM | POA: Diagnosis not present

## 2023-07-29 DIAGNOSIS — Z7982 Long term (current) use of aspirin: Secondary | ICD-10-CM | POA: Diagnosis not present

## 2023-07-29 DIAGNOSIS — E785 Hyperlipidemia, unspecified: Secondary | ICD-10-CM | POA: Diagnosis not present

## 2023-07-29 DIAGNOSIS — J309 Allergic rhinitis, unspecified: Secondary | ICD-10-CM | POA: Diagnosis not present

## 2023-07-29 DIAGNOSIS — N183 Chronic kidney disease, stage 3 unspecified: Secondary | ICD-10-CM | POA: Diagnosis not present

## 2023-07-29 DIAGNOSIS — F419 Anxiety disorder, unspecified: Secondary | ICD-10-CM | POA: Diagnosis not present

## 2023-07-29 DIAGNOSIS — K5909 Other constipation: Secondary | ICD-10-CM | POA: Diagnosis not present

## 2023-08-07 DIAGNOSIS — E785 Hyperlipidemia, unspecified: Secondary | ICD-10-CM | POA: Diagnosis not present

## 2023-08-07 DIAGNOSIS — Z7901 Long term (current) use of anticoagulants: Secondary | ICD-10-CM | POA: Diagnosis not present

## 2023-08-07 DIAGNOSIS — N183 Chronic kidney disease, stage 3 unspecified: Secondary | ICD-10-CM | POA: Diagnosis not present

## 2023-08-07 DIAGNOSIS — I10 Essential (primary) hypertension: Secondary | ICD-10-CM | POA: Diagnosis not present

## 2023-08-12 DIAGNOSIS — E559 Vitamin D deficiency, unspecified: Secondary | ICD-10-CM | POA: Diagnosis not present

## 2023-08-12 DIAGNOSIS — E785 Hyperlipidemia, unspecified: Secondary | ICD-10-CM | POA: Diagnosis not present

## 2023-08-12 DIAGNOSIS — Z7901 Long term (current) use of anticoagulants: Secondary | ICD-10-CM | POA: Diagnosis not present

## 2023-08-12 DIAGNOSIS — K5909 Other constipation: Secondary | ICD-10-CM | POA: Diagnosis not present

## 2023-08-12 DIAGNOSIS — G4700 Insomnia, unspecified: Secondary | ICD-10-CM | POA: Diagnosis not present

## 2023-08-12 DIAGNOSIS — I10 Essential (primary) hypertension: Secondary | ICD-10-CM | POA: Diagnosis not present

## 2023-08-12 DIAGNOSIS — F419 Anxiety disorder, unspecified: Secondary | ICD-10-CM | POA: Diagnosis not present

## 2023-08-12 DIAGNOSIS — Z7982 Long term (current) use of aspirin: Secondary | ICD-10-CM | POA: Diagnosis not present

## 2023-08-12 DIAGNOSIS — Z9181 History of falling: Secondary | ICD-10-CM | POA: Diagnosis not present

## 2023-08-19 DIAGNOSIS — I10 Essential (primary) hypertension: Secondary | ICD-10-CM | POA: Diagnosis not present

## 2023-08-19 DIAGNOSIS — N183 Chronic kidney disease, stage 3 unspecified: Secondary | ICD-10-CM | POA: Diagnosis not present

## 2023-08-19 DIAGNOSIS — Z7901 Long term (current) use of anticoagulants: Secondary | ICD-10-CM | POA: Diagnosis not present

## 2023-08-19 DIAGNOSIS — E785 Hyperlipidemia, unspecified: Secondary | ICD-10-CM | POA: Diagnosis not present

## 2023-08-20 DIAGNOSIS — F32A Depression, unspecified: Secondary | ICD-10-CM | POA: Diagnosis not present

## 2023-08-20 DIAGNOSIS — F1021 Alcohol dependence, in remission: Secondary | ICD-10-CM | POA: Diagnosis not present

## 2023-08-20 DIAGNOSIS — F419 Anxiety disorder, unspecified: Secondary | ICD-10-CM | POA: Diagnosis not present

## 2023-08-20 DIAGNOSIS — Z79899 Other long term (current) drug therapy: Secondary | ICD-10-CM | POA: Diagnosis not present

## 2023-08-20 DIAGNOSIS — F01B0 Vascular dementia, moderate, without behavioral disturbance, psychotic disturbance, mood disturbance, and anxiety: Secondary | ICD-10-CM | POA: Diagnosis not present

## 2023-08-26 DIAGNOSIS — F419 Anxiety disorder, unspecified: Secondary | ICD-10-CM | POA: Diagnosis not present

## 2023-08-26 DIAGNOSIS — I10 Essential (primary) hypertension: Secondary | ICD-10-CM | POA: Diagnosis not present

## 2023-08-26 DIAGNOSIS — Z9181 History of falling: Secondary | ICD-10-CM | POA: Diagnosis not present

## 2023-08-26 DIAGNOSIS — E785 Hyperlipidemia, unspecified: Secondary | ICD-10-CM | POA: Diagnosis not present

## 2023-08-26 DIAGNOSIS — Z7901 Long term (current) use of anticoagulants: Secondary | ICD-10-CM | POA: Diagnosis not present

## 2023-08-26 DIAGNOSIS — E559 Vitamin D deficiency, unspecified: Secondary | ICD-10-CM | POA: Diagnosis not present

## 2023-08-26 DIAGNOSIS — G4700 Insomnia, unspecified: Secondary | ICD-10-CM | POA: Diagnosis not present

## 2023-08-26 DIAGNOSIS — J309 Allergic rhinitis, unspecified: Secondary | ICD-10-CM | POA: Diagnosis not present

## 2023-08-26 DIAGNOSIS — K5909 Other constipation: Secondary | ICD-10-CM | POA: Diagnosis not present

## 2023-08-26 DIAGNOSIS — Z7982 Long term (current) use of aspirin: Secondary | ICD-10-CM | POA: Diagnosis not present

## 2023-08-28 DIAGNOSIS — E785 Hyperlipidemia, unspecified: Secondary | ICD-10-CM | POA: Diagnosis not present

## 2023-08-28 DIAGNOSIS — I10 Essential (primary) hypertension: Secondary | ICD-10-CM | POA: Diagnosis not present

## 2023-08-28 DIAGNOSIS — Z7901 Long term (current) use of anticoagulants: Secondary | ICD-10-CM | POA: Diagnosis not present

## 2023-08-28 DIAGNOSIS — N183 Chronic kidney disease, stage 3 unspecified: Secondary | ICD-10-CM | POA: Diagnosis not present

## 2023-08-29 DIAGNOSIS — E785 Hyperlipidemia, unspecified: Secondary | ICD-10-CM | POA: Diagnosis not present

## 2023-08-29 DIAGNOSIS — Z7901 Long term (current) use of anticoagulants: Secondary | ICD-10-CM | POA: Diagnosis not present

## 2023-08-29 DIAGNOSIS — I10 Essential (primary) hypertension: Secondary | ICD-10-CM | POA: Diagnosis not present

## 2023-08-29 DIAGNOSIS — N183 Chronic kidney disease, stage 3 unspecified: Secondary | ICD-10-CM | POA: Diagnosis not present

## 2023-08-30 DIAGNOSIS — I10 Essential (primary) hypertension: Secondary | ICD-10-CM | POA: Diagnosis not present

## 2023-08-30 DIAGNOSIS — E785 Hyperlipidemia, unspecified: Secondary | ICD-10-CM | POA: Diagnosis not present

## 2023-08-30 DIAGNOSIS — N183 Chronic kidney disease, stage 3 unspecified: Secondary | ICD-10-CM | POA: Diagnosis not present

## 2023-08-30 DIAGNOSIS — Z7901 Long term (current) use of anticoagulants: Secondary | ICD-10-CM | POA: Diagnosis not present

## 2023-09-09 DIAGNOSIS — J309 Allergic rhinitis, unspecified: Secondary | ICD-10-CM | POA: Diagnosis not present

## 2023-09-09 DIAGNOSIS — G4700 Insomnia, unspecified: Secondary | ICD-10-CM | POA: Diagnosis not present

## 2023-09-09 DIAGNOSIS — E559 Vitamin D deficiency, unspecified: Secondary | ICD-10-CM | POA: Diagnosis not present

## 2023-09-09 DIAGNOSIS — Z9181 History of falling: Secondary | ICD-10-CM | POA: Diagnosis not present

## 2023-09-09 DIAGNOSIS — K5909 Other constipation: Secondary | ICD-10-CM | POA: Diagnosis not present

## 2023-09-09 DIAGNOSIS — I10 Essential (primary) hypertension: Secondary | ICD-10-CM | POA: Diagnosis not present

## 2023-09-09 DIAGNOSIS — F419 Anxiety disorder, unspecified: Secondary | ICD-10-CM | POA: Diagnosis not present

## 2023-09-09 DIAGNOSIS — Z7901 Long term (current) use of anticoagulants: Secondary | ICD-10-CM | POA: Diagnosis not present

## 2023-09-09 DIAGNOSIS — Z7982 Long term (current) use of aspirin: Secondary | ICD-10-CM | POA: Diagnosis not present

## 2023-09-09 DIAGNOSIS — E785 Hyperlipidemia, unspecified: Secondary | ICD-10-CM | POA: Diagnosis not present

## 2023-09-13 DIAGNOSIS — Z79899 Other long term (current) drug therapy: Secondary | ICD-10-CM | POA: Diagnosis not present

## 2023-09-20 DIAGNOSIS — F419 Anxiety disorder, unspecified: Secondary | ICD-10-CM | POA: Diagnosis not present

## 2023-09-20 DIAGNOSIS — F1021 Alcohol dependence, in remission: Secondary | ICD-10-CM | POA: Diagnosis not present

## 2023-09-20 DIAGNOSIS — F32A Depression, unspecified: Secondary | ICD-10-CM | POA: Diagnosis not present

## 2023-09-20 DIAGNOSIS — F01B0 Vascular dementia, moderate, without behavioral disturbance, psychotic disturbance, mood disturbance, and anxiety: Secondary | ICD-10-CM | POA: Diagnosis not present

## 2023-09-23 ENCOUNTER — Emergency Department (HOSPITAL_COMMUNITY)

## 2023-09-23 ENCOUNTER — Emergency Department (HOSPITAL_COMMUNITY)
Admission: EM | Admit: 2023-09-23 | Discharge: 2023-09-24 | Disposition: A | Discharge status: Home / Self Care | Attending: Emergency Medicine | Admitting: Emergency Medicine

## 2023-09-23 ENCOUNTER — Other Ambulatory Visit: Payer: Self-pay

## 2023-09-23 DIAGNOSIS — E785 Hyperlipidemia, unspecified: Secondary | ICD-10-CM | POA: Diagnosis not present

## 2023-09-23 DIAGNOSIS — G4700 Insomnia, unspecified: Secondary | ICD-10-CM | POA: Diagnosis not present

## 2023-09-23 DIAGNOSIS — Z043 Encounter for examination and observation following other accident: Secondary | ICD-10-CM | POA: Diagnosis not present

## 2023-09-23 DIAGNOSIS — S301XXA Contusion of abdominal wall, initial encounter: Secondary | ICD-10-CM | POA: Diagnosis not present

## 2023-09-23 DIAGNOSIS — S299XXA Unspecified injury of thorax, initial encounter: Secondary | ICD-10-CM | POA: Insufficient documentation

## 2023-09-23 DIAGNOSIS — K5909 Other constipation: Secondary | ICD-10-CM | POA: Diagnosis not present

## 2023-09-23 DIAGNOSIS — S0990XA Unspecified injury of head, initial encounter: Secondary | ICD-10-CM | POA: Insufficient documentation

## 2023-09-23 DIAGNOSIS — N183 Chronic kidney disease, stage 3 unspecified: Secondary | ICD-10-CM | POA: Diagnosis not present

## 2023-09-23 DIAGNOSIS — R457 State of emotional shock and stress, unspecified: Secondary | ICD-10-CM | POA: Diagnosis not present

## 2023-09-23 DIAGNOSIS — S5012XA Contusion of left forearm, initial encounter: Secondary | ICD-10-CM | POA: Diagnosis not present

## 2023-09-23 DIAGNOSIS — M25522 Pain in left elbow: Secondary | ICD-10-CM | POA: Diagnosis not present

## 2023-09-23 DIAGNOSIS — I10 Essential (primary) hypertension: Secondary | ICD-10-CM | POA: Diagnosis not present

## 2023-09-23 DIAGNOSIS — F419 Anxiety disorder, unspecified: Secondary | ICD-10-CM | POA: Diagnosis not present

## 2023-09-23 DIAGNOSIS — S3991XA Unspecified injury of abdomen, initial encounter: Secondary | ICD-10-CM | POA: Diagnosis not present

## 2023-09-23 DIAGNOSIS — S199XXA Unspecified injury of neck, initial encounter: Secondary | ICD-10-CM | POA: Diagnosis not present

## 2023-09-23 DIAGNOSIS — Z9181 History of falling: Secondary | ICD-10-CM | POA: Diagnosis not present

## 2023-09-23 DIAGNOSIS — R531 Weakness: Secondary | ICD-10-CM | POA: Diagnosis not present

## 2023-09-23 DIAGNOSIS — I129 Hypertensive chronic kidney disease with stage 1 through stage 4 chronic kidney disease, or unspecified chronic kidney disease: Secondary | ICD-10-CM | POA: Diagnosis not present

## 2023-09-23 DIAGNOSIS — Z743 Need for continuous supervision: Secondary | ICD-10-CM | POA: Diagnosis not present

## 2023-09-23 DIAGNOSIS — W19XXXA Unspecified fall, initial encounter: Secondary | ICD-10-CM | POA: Diagnosis not present

## 2023-09-23 DIAGNOSIS — M7732 Calcaneal spur, left foot: Secondary | ICD-10-CM | POA: Diagnosis not present

## 2023-09-23 DIAGNOSIS — E559 Vitamin D deficiency, unspecified: Secondary | ICD-10-CM | POA: Diagnosis not present

## 2023-09-23 DIAGNOSIS — Z7982 Long term (current) use of aspirin: Secondary | ICD-10-CM | POA: Insufficient documentation

## 2023-09-23 DIAGNOSIS — J309 Allergic rhinitis, unspecified: Secondary | ICD-10-CM | POA: Diagnosis not present

## 2023-09-23 DIAGNOSIS — R059 Cough, unspecified: Secondary | ICD-10-CM | POA: Diagnosis not present

## 2023-09-23 DIAGNOSIS — S5011XA Contusion of right forearm, initial encounter: Secondary | ICD-10-CM | POA: Insufficient documentation

## 2023-09-23 DIAGNOSIS — R4182 Altered mental status, unspecified: Secondary | ICD-10-CM | POA: Diagnosis not present

## 2023-09-23 DIAGNOSIS — I491 Atrial premature depolarization: Secondary | ICD-10-CM | POA: Diagnosis not present

## 2023-09-23 DIAGNOSIS — U071 COVID-19: Secondary | ICD-10-CM | POA: Insufficient documentation

## 2023-09-23 DIAGNOSIS — Z79899 Other long term (current) drug therapy: Secondary | ICD-10-CM | POA: Insufficient documentation

## 2023-09-23 DIAGNOSIS — R410 Disorientation, unspecified: Secondary | ICD-10-CM | POA: Diagnosis not present

## 2023-09-23 DIAGNOSIS — M25521 Pain in right elbow: Secondary | ICD-10-CM | POA: Diagnosis not present

## 2023-09-23 DIAGNOSIS — Z7901 Long term (current) use of anticoagulants: Secondary | ICD-10-CM | POA: Diagnosis not present

## 2023-09-23 DIAGNOSIS — R918 Other nonspecific abnormal finding of lung field: Secondary | ICD-10-CM | POA: Diagnosis not present

## 2023-09-23 DIAGNOSIS — S3993XA Unspecified injury of pelvis, initial encounter: Secondary | ICD-10-CM | POA: Diagnosis not present

## 2023-09-23 LAB — COMPREHENSIVE METABOLIC PANEL WITH GFR
ALT: 23 U/L (ref 0–44)
AST: 40 U/L (ref 15–41)
Albumin: 3.6 g/dL (ref 3.5–5.0)
Alkaline Phosphatase: 69 U/L (ref 38–126)
Anion gap: 10 (ref 5–15)
BUN: 41 mg/dL — ABNORMAL HIGH (ref 8–23)
CO2: 24 mmol/L (ref 22–32)
Calcium: 9.6 mg/dL (ref 8.9–10.3)
Chloride: 104 mmol/L (ref 98–111)
Creatinine, Ser: 2.12 mg/dL — ABNORMAL HIGH (ref 0.61–1.24)
GFR, Estimated: 33 mL/min — ABNORMAL LOW (ref >60)
Glucose, Bld: 102 mg/dL — ABNORMAL HIGH (ref 70–99)
Potassium: 4.1 mmol/L (ref 3.5–5.1)
Sodium: 138 mmol/L (ref 135–145)
Total Bilirubin: 0.9 mg/dL (ref 0.0–1.2)
Total Protein: 7.4 g/dL (ref 6.5–8.1)

## 2023-09-23 LAB — CBC WITH DIFFERENTIAL/PLATELET
Abs Immature Granulocytes: 0.1 K/uL — ABNORMAL HIGH (ref 0.00–0.07)
Basophils Absolute: 0 K/uL (ref 0.0–0.1)
Basophils Relative: 0 %
Eosinophils Absolute: 0.1 K/uL (ref 0.0–0.5)
Eosinophils Relative: 1 %
HCT: 41.5 % (ref 39.0–52.0)
Hemoglobin: 13.9 g/dL (ref 13.0–17.0)
Immature Granulocytes: 1 %
Lymphocytes Relative: 10 %
Lymphs Abs: 1.3 K/uL (ref 0.7–4.0)
MCH: 32.6 pg (ref 26.0–34.0)
MCHC: 33.5 g/dL (ref 30.0–36.0)
MCV: 97.4 fL (ref 80.0–100.0)
Monocytes Absolute: 1.1 K/uL — ABNORMAL HIGH (ref 0.1–1.0)
Monocytes Relative: 9 %
Neutro Abs: 10.3 K/uL — ABNORMAL HIGH (ref 1.7–7.7)
Neutrophils Relative %: 79 %
Platelets: 231 K/uL (ref 150–400)
RBC: 4.26 MIL/uL (ref 4.22–5.81)
RDW: 12.7 % (ref 11.5–15.5)
WBC: 12.9 K/uL — ABNORMAL HIGH (ref 4.0–10.5)
nRBC: 0 % (ref 0.0–0.2)

## 2023-09-23 LAB — RESP PANEL BY RT-PCR (RSV, FLU A&B, COVID)  RVPGX2
Influenza A by PCR: NEGATIVE
Influenza B by PCR: NEGATIVE
Resp Syncytial Virus by PCR: NEGATIVE
SARS Coronavirus 2 by RT PCR: POSITIVE — AB

## 2023-09-23 LAB — ETHANOL: Alcohol, Ethyl (B): <15 mg/dL (ref <15)

## 2023-09-23 LAB — MAGNESIUM: Magnesium: 2 mg/dL (ref 1.7–2.4)

## 2023-09-23 MED ORDER — IOHEXOL 350 MG/ML SOLN
75.0000 mL | Freq: Once | INTRAVENOUS | Status: AC | PRN
  Administered 2023-09-23: 75 mL via INTRAVENOUS

## 2023-09-23 NOTE — ED Notes (Signed)
 [facility] called

## 2023-09-23 NOTE — ED Notes (Addendum)
 Offered patient ice for right elbow, pt declined ice Im too cold for that, may I have a warm blanket.

## 2023-09-23 NOTE — ED Triage Notes (Addendum)
 [name] on right side on Friday --> didn't hit his head. Refused transport.  Facility called today due to increased weakness x 3 days due to them having trouble helping his shower (took several people instead of 1).    Pt with hx of stroke and right sided deficits. Reports increased right arm pain from the fall and and several bruises noted. Also reports his right hip hurts. Hx of decreased mental status over the last 6 months.

## 2023-09-23 NOTE — ED Notes (Signed)
 Patient transported to X-ray

## 2023-09-23 NOTE — ED Provider Notes (Signed)
 [place] EMERGENCY DEPARTMENT AT [facility] Provider Note   CSN: [id number] Arrival date & time: 09/23/23  1631     Patient presents with: Felton Norleen Ilah Edith is a 68 y.o. male.   68 year old male presents today following a fall that occurred on Friday.  He denies hitting his head.  He appears to be alert and oriented however when I initially asked him if he had a fall he denied this.  He was unable to tell me the name of the facility he is coming from.  I attempted to get a hold of the nursing home facility but was unable to.  He states he has no weakness.  He does tell me that he is wheelchair-bound.  He states that is difficult for him to get from the wheelchair to the bed and believes this is when he fell.  He has quite a bit of bruising worse on his right side including on his abdomen.  He has some bleeding on the bedsheet which appears to be coming from his toes on his left foot.  He states this was likely injured on his way into the emergency department.  He does have a cough during the interview.  The history is provided by the patient. No language interpreter was used.       Prior to Admission medications   Medication Sig Start Date End Date Taking? Authorizing Provider  acetaminophen  (TYLENOL ) 650 MG CR tablet Take 650 mg by mouth 3 (three) times daily.     [provider]  amLODipine  (NORVASC ) 10 MG tablet Take 10 mg by mouth daily.  06/15/17   [provider]  aspirin  EC 81 MG tablet Take 81 mg by mouth daily.    [provider]  atorvastatin  (LIPITOR) 40 MG tablet Take 1 tablet (40 mg total) by mouth daily at 6 PM. 10/26/16   Rai, Nydia POUR, MD  clopidogrel  (PLAVIX ) 75 MG tablet Take 1 tablet (75 mg total) by mouth daily with breakfast. 10/27/16   Rai, Ripudeep K, MD  diphenhydrAMINE (BENADRYL) 25 mg capsule Take 25 mg by mouth every 6 (six) hours as needed for allergies.    [provider]  hydrALAZINE  (APRESOLINE ) 25 MG  tablet Take 25 mg by mouth 2 (two) times daily.    [provider]  HYDROcodone -acetaminophen  (NORCO/VICODIN) 5-325 MG tablet Take 1 tablet by mouth every 6 (six) hours as needed for moderate pain. 11/18/17   Franchot Hough, DO  Lidocaine  4 % PTCH Apply to right shoulder every 12 hours 04/02/17   [provider]  metoprolol  succinate (TOPROL -XL) 25 MG 24 hr tablet Take 25 mg by mouth 2 (two) times daily.    [provider]  Nutritional Supplements (NUTRITIONAL SUPPLEMENT PO) Low Concentrated Sweets Diet - Regular texture, Regular / Thin consistency    [provider]  ondansetron  (ZOFRAN ) 4 MG tablet Take 4 mg by mouth every 8 (eight) hours as needed for nausea or vomiting. 12/27/16   [provider]  senna-docusate (SENOKOT-S) 8.6-50 MG tablet Take 1 tablet by mouth 2 (two) times daily as needed for mild constipation.     [provider]  tiZANidine (ZANAFLEX) 4 MG tablet Take 4 mg by mouth 4 (four) times daily.  01/17/17   [provider]  traMADol  (ULTRAM ) 50 MG tablet Give 1 tablet by mouth two times daily for moderate to severe pain and give 2 tablets by mouth at bedtime 09/09/17   Franchot Hough,  DO    Allergies: Patient has no known allergies.    Review of Systems  Constitutional:  Negative for fever.  Respiratory:  Positive for cough. Negative for shortness of breath.   Cardiovascular:  Negative for chest pain.  Gastrointestinal:  Positive for abdominal pain.  Musculoskeletal:  Positive for arthralgias.  Skin:  Positive for wound.  All other systems reviewed and are negative.   Updated Vital Signs BP (!) 130/97 (BP Location: Left Arm)   Pulse 83   Temp 98.6 F (37 C) (Oral)   Resp 17   Ht 5' 11 (1.803 m)   Wt 96.6 kg   SpO2 100%   BMI 29.70 kg/m   Physical Exam Vitals and nursing note reviewed.  Constitutional:      General: He is not in acute distress.    Appearance: Normal appearance. He is not  ill-appearing.  HENT:     Head: Normocephalic and atraumatic.     Nose: Nose normal.  Eyes:     Conjunctiva/sclera: Conjunctivae normal.  Cardiovascular:     Rate and Rhythm: Normal rate and regular rhythm.  Pulmonary:     Effort: Pulmonary effort is normal. No respiratory distress.  Abdominal:     Palpations: Abdomen is soft.     Tenderness: There is abdominal tenderness. There is no guarding.  Musculoskeletal:        General: No deformity. Normal range of motion.     Cervical back: Normal range of motion.     Comments: Limited range of motion on the right side of his body due to previous stroke.  Otherwise good range of motion and good strength on his left side.  There is generalized bruising including on the left arm, right arm mostly around the elbow and the humerus.  Also bruising noted to the right side of the abdomen.  Left foot with some dried blood particularly around the third and the fourth toe.  Skin:    Findings: No rash.  Neurological:     Mental Status: He is alert.     (all labs ordered are listed, but only abnormal results are displayed) Labs Reviewed  RESP PANEL BY RT-PCR (RSV, FLU A&B, COVID)  RVPGX2  CBC WITH DIFFERENTIAL/PLATELET  COMPREHENSIVE METABOLIC PANEL WITH GFR  ETHANOL  MAGNESIUM    EKG: None  Radiology: No results found.   Procedures   Medications Ordered in the ED - No data to display                                  Medical Decision Making Amount and/or Complexity of Data Reviewed Labs: ordered. Radiology: ordered.  Risk Prescription drug management.   Medical Decision Making / ED Course   This patient presents to the ED for concern of fall, this involves an extensive number of treatment options, and is a complaint that carries with it a high risk of complications and morbidity.  The differential diagnosis includes head injury, fracture, contusion, internal organ injury given the abdominal Kievit bruising  MDM: 67 year old  male presents from nursing home facility for concern of a fall that occurred on Friday.  He denies hitting his head.  He does have bruising on bilateral forearms, has bruising to his right abdominal Gianfrancesco.  He is wheelchair-bound chronically.  Requires assistance transitioning.  He fell while transitioning. No evidence of infection on workup today. Electrolytes are within normal. CT head, C-spine, CT chest abdomen  pelvis with contrast does not show any acute finding. X-rays are without acute fracture. He is stable for discharge. Patient is in agreement with plan. Discharged in stable condition. No lacerations or any other injury that requires repair.  He remained alert and oriented throughout the emergency department.  Additional history obtained: -Additional history obtained from attempted to call nursing home but they did not answer. -External records from outside source obtained and reviewed including: Chart review including previous notes, labs, imaging, consultation notes   Lab Tests: -I ordered, reviewed, and interpreted labs.   The pertinent results include:   Labs Reviewed  RESP PANEL BY RT-PCR (RSV, FLU A&B, COVID)  RVPGX2 - Abnormal; Notable for the following components:      Result Value   SARS Coronavirus 2 by RT PCR POSITIVE (*)    All other components within normal limits  CBC WITH DIFFERENTIAL/PLATELET - Abnormal; Notable for the following components:   WBC 12.9 (*)    Neutro Abs 10.3 (*)    Monocytes Absolute 1.1 (*)    Abs Immature Granulocytes 0.10 (*)    All other components within normal limits  COMPREHENSIVE METABOLIC PANEL WITH GFR - Abnormal; Notable for the following components:   Glucose, Bld 102 (*)    BUN 41 (*)    Creatinine, Ser 2.12 (*)    GFR, Estimated 33 (*)    All other components within normal limits  ETHANOL  MAGNESIUM      EKG  EKG Interpretation Date/Time:    Ventricular Rate:    PR Interval:    QRS Duration:    QT Interval:    QTC  Calculation:   R Axis:      Text Interpretation:           Imaging Studies ordered: I ordered imaging studies including CT head, CT C-spine, CT chest abdomen pelvis with contrast, right elbow x-ray, left elbow x-ray, left foot x-ray I independently visualized and interpreted imaging. I agree with the radiologist interpretation   Medicines ordered and prescription drug management: Meds ordered this encounter  Medications   iohexol  (OMNIPAQUE ) 350 MG/ML injection 75 mL    -I have reviewed the patients home medicines and have made adjustments as needed   Reevaluation: After the interventions noted above, I reevaluated the patient and found that they have :improved  Co morbidities that complicate the patient evaluation  Past Medical History:  Diagnosis Date   CKD (chronic kidney disease) stage 3, GFR 30-59 ml/min (HCC) 11/08/2016   CVA (cerebral vascular accident) (HCC) 10/17/2016   Depression    Hx of pleural effusion    Hyperlipidemia    Hyperlipidemia LDL goal <70 04/22/2008   Qualifier: Diagnosis of  By: Corrie MD, Francis HERO    Hypertension       Dispostion: Patient is stable for discharge.  Discharged in stable condition.  Return precaution discussed.  Final diagnoses:  Fall, initial encounter  Injury of head, initial encounter  Contusion of abdominal Kawahara, initial encounter  Pain of both elbows    ED Discharge Orders     None          Hildegard Loge, PA-C 09/23/23 2238    Tegeler, Lonni PARAS, MD 09/23/23 2330

## 2023-09-23 NOTE — Discharge Instructions (Addendum)
 Your blood work and imaging did not show any concerns.  No fracture or internal bleeding.  Follow-up with your primary care doctor.  Return for any emergent symptoms.

## 2023-09-24 DIAGNOSIS — Z7401 Bed confinement status: Secondary | ICD-10-CM | POA: Diagnosis not present

## 2023-09-24 DIAGNOSIS — Z743 Need for continuous supervision: Secondary | ICD-10-CM | POA: Diagnosis not present

## 2023-09-24 DIAGNOSIS — R4182 Altered mental status, unspecified: Secondary | ICD-10-CM | POA: Diagnosis not present

## 2023-09-24 NOTE — ED Notes (Signed)
 Received report. Pt resting, call bell within reach. Watching tv. Waiting on [facility].

## 2023-09-29 DIAGNOSIS — N183 Chronic kidney disease, stage 3 unspecified: Secondary | ICD-10-CM | POA: Diagnosis not present

## 2023-09-29 DIAGNOSIS — I10 Essential (primary) hypertension: Secondary | ICD-10-CM | POA: Diagnosis not present

## 2023-09-29 DIAGNOSIS — E785 Hyperlipidemia, unspecified: Secondary | ICD-10-CM | POA: Diagnosis not present

## 2023-09-29 DIAGNOSIS — Z7901 Long term (current) use of anticoagulants: Secondary | ICD-10-CM | POA: Diagnosis not present

## 2023-10-07 DIAGNOSIS — E559 Vitamin D deficiency, unspecified: Secondary | ICD-10-CM | POA: Diagnosis not present

## 2023-10-07 DIAGNOSIS — J309 Allergic rhinitis, unspecified: Secondary | ICD-10-CM | POA: Diagnosis not present

## 2023-10-07 DIAGNOSIS — F419 Anxiety disorder, unspecified: Secondary | ICD-10-CM | POA: Diagnosis not present

## 2023-10-07 DIAGNOSIS — Z7982 Long term (current) use of aspirin: Secondary | ICD-10-CM | POA: Diagnosis not present

## 2023-10-07 DIAGNOSIS — Z9181 History of falling: Secondary | ICD-10-CM | POA: Diagnosis not present

## 2023-10-07 DIAGNOSIS — K5909 Other constipation: Secondary | ICD-10-CM | POA: Diagnosis not present

## 2023-10-07 DIAGNOSIS — E785 Hyperlipidemia, unspecified: Secondary | ICD-10-CM | POA: Diagnosis not present

## 2023-10-07 DIAGNOSIS — Z7901 Long term (current) use of anticoagulants: Secondary | ICD-10-CM | POA: Diagnosis not present

## 2023-10-07 DIAGNOSIS — G4700 Insomnia, unspecified: Secondary | ICD-10-CM | POA: Diagnosis not present

## 2023-10-07 DIAGNOSIS — I10 Essential (primary) hypertension: Secondary | ICD-10-CM | POA: Diagnosis not present

## 2023-10-22 DIAGNOSIS — F32A Depression, unspecified: Secondary | ICD-10-CM | POA: Diagnosis not present

## 2023-10-22 DIAGNOSIS — F1021 Alcohol dependence, in remission: Secondary | ICD-10-CM | POA: Diagnosis not present

## 2023-10-22 DIAGNOSIS — F01B0 Vascular dementia, moderate, without behavioral disturbance, psychotic disturbance, mood disturbance, and anxiety: Secondary | ICD-10-CM | POA: Diagnosis not present

## 2023-10-22 DIAGNOSIS — F419 Anxiety disorder, unspecified: Secondary | ICD-10-CM | POA: Diagnosis not present

## 2023-10-28 DIAGNOSIS — E785 Hyperlipidemia, unspecified: Secondary | ICD-10-CM | POA: Diagnosis not present

## 2023-10-28 DIAGNOSIS — F419 Anxiety disorder, unspecified: Secondary | ICD-10-CM | POA: Diagnosis not present

## 2023-10-28 DIAGNOSIS — K5909 Other constipation: Secondary | ICD-10-CM | POA: Diagnosis not present

## 2023-10-28 DIAGNOSIS — Z7901 Long term (current) use of anticoagulants: Secondary | ICD-10-CM | POA: Diagnosis not present

## 2023-10-28 DIAGNOSIS — I10 Essential (primary) hypertension: Secondary | ICD-10-CM | POA: Diagnosis not present

## 2023-10-28 DIAGNOSIS — E559 Vitamin D deficiency, unspecified: Secondary | ICD-10-CM | POA: Diagnosis not present

## 2023-10-28 DIAGNOSIS — G4700 Insomnia, unspecified: Secondary | ICD-10-CM | POA: Diagnosis not present

## 2023-10-28 DIAGNOSIS — Z7982 Long term (current) use of aspirin: Secondary | ICD-10-CM | POA: Diagnosis not present

## 2023-10-28 DIAGNOSIS — J309 Allergic rhinitis, unspecified: Secondary | ICD-10-CM | POA: Diagnosis not present

## 2023-10-28 DIAGNOSIS — Z9181 History of falling: Secondary | ICD-10-CM | POA: Diagnosis not present

## 2023-10-29 DIAGNOSIS — N183 Chronic kidney disease, stage 3 unspecified: Secondary | ICD-10-CM | POA: Diagnosis not present

## 2023-10-29 DIAGNOSIS — E785 Hyperlipidemia, unspecified: Secondary | ICD-10-CM | POA: Diagnosis not present

## 2023-10-29 DIAGNOSIS — I10 Essential (primary) hypertension: Secondary | ICD-10-CM | POA: Diagnosis not present

## 2023-10-29 DIAGNOSIS — Z7901 Long term (current) use of anticoagulants: Secondary | ICD-10-CM | POA: Diagnosis not present

## 2023-11-11 DIAGNOSIS — Z7982 Long term (current) use of aspirin: Secondary | ICD-10-CM | POA: Diagnosis not present

## 2023-11-11 DIAGNOSIS — Z9181 History of falling: Secondary | ICD-10-CM | POA: Diagnosis not present

## 2023-11-11 DIAGNOSIS — F419 Anxiety disorder, unspecified: Secondary | ICD-10-CM | POA: Diagnosis not present

## 2023-11-11 DIAGNOSIS — G4700 Insomnia, unspecified: Secondary | ICD-10-CM | POA: Diagnosis not present

## 2023-11-11 DIAGNOSIS — K5909 Other constipation: Secondary | ICD-10-CM | POA: Diagnosis not present

## 2023-11-11 DIAGNOSIS — E559 Vitamin D deficiency, unspecified: Secondary | ICD-10-CM | POA: Diagnosis not present

## 2023-11-11 DIAGNOSIS — I10 Essential (primary) hypertension: Secondary | ICD-10-CM | POA: Diagnosis not present

## 2023-11-11 DIAGNOSIS — Z7901 Long term (current) use of anticoagulants: Secondary | ICD-10-CM | POA: Diagnosis not present

## 2023-11-11 DIAGNOSIS — E785 Hyperlipidemia, unspecified: Secondary | ICD-10-CM | POA: Diagnosis not present

## 2023-11-15 DIAGNOSIS — Z79899 Other long term (current) drug therapy: Secondary | ICD-10-CM | POA: Diagnosis not present

## 2023-11-15 DIAGNOSIS — F32A Depression, unspecified: Secondary | ICD-10-CM | POA: Diagnosis not present

## 2023-11-15 DIAGNOSIS — F419 Anxiety disorder, unspecified: Secondary | ICD-10-CM | POA: Diagnosis not present

## 2023-11-15 DIAGNOSIS — F01B0 Vascular dementia, moderate, without behavioral disturbance, psychotic disturbance, mood disturbance, and anxiety: Secondary | ICD-10-CM | POA: Diagnosis not present

## 2023-11-15 DIAGNOSIS — F1021 Alcohol dependence, in remission: Secondary | ICD-10-CM | POA: Diagnosis not present

## 2023-11-20 DIAGNOSIS — I10 Essential (primary) hypertension: Secondary | ICD-10-CM | POA: Diagnosis not present

## 2023-11-20 DIAGNOSIS — Z7901 Long term (current) use of anticoagulants: Secondary | ICD-10-CM | POA: Diagnosis not present

## 2023-11-25 DIAGNOSIS — F419 Anxiety disorder, unspecified: Secondary | ICD-10-CM | POA: Diagnosis not present

## 2023-11-25 DIAGNOSIS — E559 Vitamin D deficiency, unspecified: Secondary | ICD-10-CM | POA: Diagnosis not present

## 2023-11-25 DIAGNOSIS — Z9181 History of falling: Secondary | ICD-10-CM | POA: Diagnosis not present

## 2023-11-25 DIAGNOSIS — Z7901 Long term (current) use of anticoagulants: Secondary | ICD-10-CM | POA: Diagnosis not present

## 2023-11-25 DIAGNOSIS — Z7982 Long term (current) use of aspirin: Secondary | ICD-10-CM | POA: Diagnosis not present

## 2023-11-25 DIAGNOSIS — J309 Allergic rhinitis, unspecified: Secondary | ICD-10-CM | POA: Diagnosis not present

## 2023-11-25 DIAGNOSIS — I10 Essential (primary) hypertension: Secondary | ICD-10-CM | POA: Diagnosis not present

## 2023-11-25 DIAGNOSIS — E785 Hyperlipidemia, unspecified: Secondary | ICD-10-CM | POA: Diagnosis not present

## 2023-11-25 DIAGNOSIS — K5909 Other constipation: Secondary | ICD-10-CM | POA: Diagnosis not present

## 2023-11-25 DIAGNOSIS — G4700 Insomnia, unspecified: Secondary | ICD-10-CM | POA: Diagnosis not present

## 2023-12-09 DIAGNOSIS — I10 Essential (primary) hypertension: Secondary | ICD-10-CM | POA: Diagnosis not present

## 2023-12-09 DIAGNOSIS — F419 Anxiety disorder, unspecified: Secondary | ICD-10-CM | POA: Diagnosis not present

## 2023-12-09 DIAGNOSIS — Z9181 History of falling: Secondary | ICD-10-CM | POA: Diagnosis not present

## 2023-12-09 DIAGNOSIS — Z7982 Long term (current) use of aspirin: Secondary | ICD-10-CM | POA: Diagnosis not present

## 2023-12-09 DIAGNOSIS — E559 Vitamin D deficiency, unspecified: Secondary | ICD-10-CM | POA: Diagnosis not present

## 2023-12-09 DIAGNOSIS — G4700 Insomnia, unspecified: Secondary | ICD-10-CM | POA: Diagnosis not present

## 2023-12-09 DIAGNOSIS — E785 Hyperlipidemia, unspecified: Secondary | ICD-10-CM | POA: Diagnosis not present

## 2023-12-09 DIAGNOSIS — K5909 Other constipation: Secondary | ICD-10-CM | POA: Diagnosis not present

## 2023-12-09 DIAGNOSIS — Z7901 Long term (current) use of anticoagulants: Secondary | ICD-10-CM | POA: Diagnosis not present

## 2023-12-09 DIAGNOSIS — J309 Allergic rhinitis, unspecified: Secondary | ICD-10-CM | POA: Diagnosis not present

## 2023-12-20 DIAGNOSIS — F1021 Alcohol dependence, in remission: Secondary | ICD-10-CM | POA: Diagnosis not present

## 2023-12-20 DIAGNOSIS — F419 Anxiety disorder, unspecified: Secondary | ICD-10-CM | POA: Diagnosis not present

## 2023-12-20 DIAGNOSIS — F32A Depression, unspecified: Secondary | ICD-10-CM | POA: Diagnosis not present

## 2023-12-20 DIAGNOSIS — Z79899 Other long term (current) drug therapy: Secondary | ICD-10-CM | POA: Diagnosis not present

## 2023-12-20 DIAGNOSIS — F01B0 Vascular dementia, moderate, without behavioral disturbance, psychotic disturbance, mood disturbance, and anxiety: Secondary | ICD-10-CM | POA: Diagnosis not present

## 2023-12-23 DIAGNOSIS — I10 Essential (primary) hypertension: Secondary | ICD-10-CM | POA: Diagnosis not present

## 2023-12-23 DIAGNOSIS — Z9181 History of falling: Secondary | ICD-10-CM | POA: Diagnosis not present

## 2023-12-23 DIAGNOSIS — E785 Hyperlipidemia, unspecified: Secondary | ICD-10-CM | POA: Diagnosis not present

## 2023-12-23 DIAGNOSIS — K5909 Other constipation: Secondary | ICD-10-CM | POA: Diagnosis not present

## 2023-12-23 DIAGNOSIS — E559 Vitamin D deficiency, unspecified: Secondary | ICD-10-CM | POA: Diagnosis not present

## 2023-12-23 DIAGNOSIS — G4700 Insomnia, unspecified: Secondary | ICD-10-CM | POA: Diagnosis not present

## 2023-12-23 DIAGNOSIS — Z7901 Long term (current) use of anticoagulants: Secondary | ICD-10-CM | POA: Diagnosis not present

## 2023-12-23 DIAGNOSIS — Z7982 Long term (current) use of aspirin: Secondary | ICD-10-CM | POA: Diagnosis not present

## 2024-01-06 DIAGNOSIS — E559 Vitamin D deficiency, unspecified: Secondary | ICD-10-CM | POA: Diagnosis not present

## 2024-01-06 DIAGNOSIS — G4700 Insomnia, unspecified: Secondary | ICD-10-CM | POA: Diagnosis not present

## 2024-01-06 DIAGNOSIS — F419 Anxiety disorder, unspecified: Secondary | ICD-10-CM | POA: Diagnosis not present

## 2024-01-06 DIAGNOSIS — I10 Essential (primary) hypertension: Secondary | ICD-10-CM | POA: Diagnosis not present

## 2024-01-06 DIAGNOSIS — Z7982 Long term (current) use of aspirin: Secondary | ICD-10-CM | POA: Diagnosis not present

## 2024-01-06 DIAGNOSIS — Z7901 Long term (current) use of anticoagulants: Secondary | ICD-10-CM | POA: Diagnosis not present

## 2024-01-06 DIAGNOSIS — Z9181 History of falling: Secondary | ICD-10-CM | POA: Diagnosis not present

## 2024-01-06 DIAGNOSIS — J309 Allergic rhinitis, unspecified: Secondary | ICD-10-CM | POA: Diagnosis not present

## 2024-01-06 DIAGNOSIS — K5909 Other constipation: Secondary | ICD-10-CM | POA: Diagnosis not present

## 2024-01-06 DIAGNOSIS — E785 Hyperlipidemia, unspecified: Secondary | ICD-10-CM | POA: Diagnosis not present
